# Patient Record
Sex: Male | Born: 1960 | Race: White | Hispanic: No | Smoking: Never smoker
Health system: Southern US, Community
[De-identification: ages and names within clinical notes are randomized; demographics above are authoritative.]

## PROBLEM LIST (undated history)

## (undated) DIAGNOSIS — I82409 Acute embolism and thrombosis of unspecified deep veins of unspecified lower extremity: Secondary | ICD-10-CM

## (undated) DIAGNOSIS — H269 Unspecified cataract: Secondary | ICD-10-CM

## (undated) DIAGNOSIS — I7121 Aneurysm of the ascending aorta, without rupture: Secondary | ICD-10-CM

## (undated) DIAGNOSIS — E785 Hyperlipidemia, unspecified: Secondary | ICD-10-CM

## (undated) DIAGNOSIS — Z5189 Encounter for other specified aftercare: Secondary | ICD-10-CM

## (undated) DIAGNOSIS — Z952 Presence of prosthetic heart valve: Secondary | ICD-10-CM

## (undated) DIAGNOSIS — Z86010 Personal history of colonic polyps: Secondary | ICD-10-CM

## (undated) DIAGNOSIS — I330 Acute and subacute infective endocarditis: Secondary | ICD-10-CM

## (undated) DIAGNOSIS — G43909 Migraine, unspecified, not intractable, without status migrainosus: Secondary | ICD-10-CM

## (undated) DIAGNOSIS — I639 Cerebral infarction, unspecified: Secondary | ICD-10-CM

## (undated) DIAGNOSIS — N200 Calculus of kidney: Secondary | ICD-10-CM

## (undated) DIAGNOSIS — I712 Thoracic aortic aneurysm, without rupture: Secondary | ICD-10-CM

## (undated) DIAGNOSIS — F419 Anxiety disorder, unspecified: Secondary | ICD-10-CM

## (undated) HISTORY — DX: Migraine, unspecified, not intractable, without status migrainosus: G43.909

## (undated) HISTORY — PX: COLONOSCOPY: SHX174

## (undated) HISTORY — DX: Personal history of colonic polyps: Z86.010

## (undated) HISTORY — DX: Calculus of kidney: N20.0

## (undated) HISTORY — PX: ABDOMINAL AORTIC ANEURYSM REPAIR: SUR1152

## (undated) HISTORY — DX: Thoracic aortic aneurysm, without rupture: I71.2

## (undated) HISTORY — DX: Hyperlipidemia, unspecified: E78.5

## (undated) HISTORY — DX: Unspecified cataract: H26.9

## (undated) HISTORY — DX: Cerebral infarction, unspecified: I63.9

## (undated) HISTORY — PX: POLYPECTOMY: SHX149

## (undated) HISTORY — DX: Anxiety disorder, unspecified: F41.9

## (undated) HISTORY — DX: Aneurysm of the ascending aorta, without rupture: I71.21

## (undated) HISTORY — DX: Encounter for other specified aftercare: Z51.89

---

## 1998-07-04 ENCOUNTER — Emergency Department (HOSPITAL_COMMUNITY): Admission: EM | Admit: 1998-07-04 | Discharge: 1998-07-04 | Payer: Self-pay | Admitting: Internal Medicine

## 2005-01-22 ENCOUNTER — Encounter: Admission: RE | Admit: 2005-01-22 | Discharge: 2005-01-22 | Payer: Self-pay | Admitting: Cardiology

## 2005-01-24 HISTORY — PX: CARDIAC CATHETERIZATION: SHX172

## 2005-03-26 HISTORY — PX: AORTIC VALVE REPLACEMENT: SHX41

## 2005-04-17 DIAGNOSIS — Q231 Congenital insufficiency of aortic valve: Secondary | ICD-10-CM

## 2005-04-17 DIAGNOSIS — I719 Aortic aneurysm of unspecified site, without rupture: Secondary | ICD-10-CM | POA: Insufficient documentation

## 2005-04-17 DIAGNOSIS — Z952 Presence of prosthetic heart valve: Secondary | ICD-10-CM | POA: Insufficient documentation

## 2005-05-17 ENCOUNTER — Encounter: Admission: RE | Admit: 2005-05-17 | Discharge: 2005-05-17 | Payer: Self-pay | Admitting: Cardiology

## 2006-01-01 ENCOUNTER — Ambulatory Visit: Payer: Self-pay | Admitting: Family Medicine

## 2006-01-17 ENCOUNTER — Ambulatory Visit: Payer: Self-pay | Admitting: Family Medicine

## 2007-03-07 ENCOUNTER — Ambulatory Visit: Payer: Self-pay | Admitting: Family Medicine

## 2007-04-15 ENCOUNTER — Ambulatory Visit: Payer: Self-pay | Admitting: Family Medicine

## 2008-03-26 HISTORY — PX: COLON SURGERY: SHX602

## 2008-04-07 ENCOUNTER — Ambulatory Visit: Payer: Self-pay | Admitting: Family Medicine

## 2008-04-09 ENCOUNTER — Encounter: Admission: RE | Admit: 2008-04-09 | Discharge: 2008-04-09 | Payer: Self-pay | Admitting: Family Medicine

## 2008-04-09 ENCOUNTER — Ambulatory Visit: Payer: Self-pay | Admitting: Family Medicine

## 2008-06-07 ENCOUNTER — Inpatient Hospital Stay (HOSPITAL_COMMUNITY): Admission: AD | Admit: 2008-06-07 | Discharge: 2008-06-10 | Payer: Self-pay | Admitting: Cardiovascular Disease

## 2008-06-07 DIAGNOSIS — Z8672 Personal history of thrombophlebitis: Secondary | ICD-10-CM

## 2008-06-08 ENCOUNTER — Encounter (INDEPENDENT_AMBULATORY_CARE_PROVIDER_SITE_OTHER): Payer: Self-pay | Admitting: Cardiovascular Disease

## 2008-06-11 ENCOUNTER — Ambulatory Visit: Payer: Self-pay | Admitting: Family Medicine

## 2008-06-13 ENCOUNTER — Inpatient Hospital Stay (HOSPITAL_COMMUNITY): Admission: EM | Admit: 2008-06-13 | Discharge: 2008-06-14 | Payer: Self-pay | Admitting: Emergency Medicine

## 2008-06-14 ENCOUNTER — Encounter (INDEPENDENT_AMBULATORY_CARE_PROVIDER_SITE_OTHER): Payer: Self-pay | Admitting: Cardiology

## 2008-06-16 ENCOUNTER — Ambulatory Visit: Payer: Self-pay | Admitting: Family Medicine

## 2008-06-16 ENCOUNTER — Inpatient Hospital Stay (HOSPITAL_COMMUNITY): Admission: AD | Admit: 2008-06-16 | Discharge: 2008-06-21 | Payer: Self-pay | Admitting: Cardiology

## 2008-06-16 ENCOUNTER — Ambulatory Visit: Payer: Self-pay | Admitting: Internal Medicine

## 2008-06-16 ENCOUNTER — Encounter (INDEPENDENT_AMBULATORY_CARE_PROVIDER_SITE_OTHER): Payer: Self-pay | Admitting: Cardiology

## 2008-06-17 ENCOUNTER — Ambulatory Visit: Payer: Self-pay | Admitting: Vascular Surgery

## 2008-06-17 ENCOUNTER — Encounter (INDEPENDENT_AMBULATORY_CARE_PROVIDER_SITE_OTHER): Payer: Self-pay | Admitting: Cardiology

## 2008-06-28 ENCOUNTER — Telehealth: Payer: Self-pay | Admitting: Internal Medicine

## 2008-06-29 DIAGNOSIS — I330 Acute and subacute infective endocarditis: Secondary | ICD-10-CM

## 2008-07-05 DIAGNOSIS — T827XXA Infection and inflammatory reaction due to other cardiac and vascular devices, implants and grafts, initial encounter: Secondary | ICD-10-CM | POA: Insufficient documentation

## 2008-07-07 ENCOUNTER — Encounter: Payer: Self-pay | Admitting: Internal Medicine

## 2008-07-10 ENCOUNTER — Emergency Department (HOSPITAL_COMMUNITY): Admission: EM | Admit: 2008-07-10 | Discharge: 2008-07-10 | Payer: Self-pay | Admitting: Emergency Medicine

## 2008-07-11 ENCOUNTER — Encounter: Payer: Self-pay | Admitting: Cardiology

## 2008-07-13 ENCOUNTER — Telehealth: Payer: Self-pay | Admitting: Internal Medicine

## 2008-07-16 DIAGNOSIS — I359 Nonrheumatic aortic valve disorder, unspecified: Secondary | ICD-10-CM | POA: Insufficient documentation

## 2008-07-27 ENCOUNTER — Telehealth: Payer: Self-pay | Admitting: Cardiology

## 2008-07-30 ENCOUNTER — Encounter: Payer: Self-pay | Admitting: Internal Medicine

## 2008-07-30 DIAGNOSIS — Z8673 Personal history of transient ischemic attack (TIA), and cerebral infarction without residual deficits: Secondary | ICD-10-CM | POA: Insufficient documentation

## 2008-07-30 DIAGNOSIS — Q249 Congenital malformation of heart, unspecified: Secondary | ICD-10-CM | POA: Insufficient documentation

## 2008-08-05 ENCOUNTER — Telehealth: Payer: Self-pay | Admitting: Cardiology

## 2008-08-06 ENCOUNTER — Encounter: Payer: Self-pay | Admitting: Cardiology

## 2008-08-09 ENCOUNTER — Ambulatory Visit: Payer: Self-pay | Admitting: Cardiology

## 2008-08-10 ENCOUNTER — Encounter: Payer: Self-pay | Admitting: Cardiology

## 2008-08-13 ENCOUNTER — Ambulatory Visit: Payer: Self-pay | Admitting: Cardiology

## 2008-08-13 ENCOUNTER — Ambulatory Visit: Payer: Self-pay | Admitting: Internal Medicine

## 2008-08-16 ENCOUNTER — Ambulatory Visit: Payer: Self-pay | Admitting: Cardiovascular Disease

## 2008-08-24 ENCOUNTER — Encounter: Payer: Self-pay | Admitting: *Deleted

## 2008-08-24 ENCOUNTER — Ambulatory Visit: Payer: Self-pay | Admitting: Cardiology

## 2008-08-24 LAB — CONVERTED CEMR LAB
POC INR: 4.4
Protime: 25.3

## 2008-08-31 ENCOUNTER — Ambulatory Visit: Payer: Self-pay | Admitting: Cardiology

## 2008-08-31 ENCOUNTER — Encounter (INDEPENDENT_AMBULATORY_CARE_PROVIDER_SITE_OTHER): Payer: Self-pay | Admitting: Cardiology

## 2008-08-31 LAB — CONVERTED CEMR LAB
POC INR: 1.8
Protime: 16.4

## 2008-09-09 ENCOUNTER — Ambulatory Visit: Payer: Self-pay | Admitting: Internal Medicine

## 2008-09-09 LAB — CONVERTED CEMR LAB
POC INR: 3.2
Protime: 21.6

## 2008-09-23 ENCOUNTER — Ambulatory Visit: Payer: Self-pay | Admitting: Cardiology

## 2008-09-23 LAB — CONVERTED CEMR LAB
POC INR: 2.8
Prothrombin Time: 20.4 s

## 2008-09-29 ENCOUNTER — Encounter: Payer: Self-pay | Admitting: *Deleted

## 2008-10-12 ENCOUNTER — Encounter: Admission: RE | Admit: 2008-10-12 | Discharge: 2008-10-12 | Payer: Self-pay | Admitting: Neurology

## 2008-10-13 ENCOUNTER — Telehealth: Payer: Self-pay | Admitting: Cardiology

## 2008-10-14 ENCOUNTER — Ambulatory Visit: Payer: Self-pay | Admitting: Cardiovascular Disease

## 2008-10-14 LAB — CONVERTED CEMR LAB
POC INR: 2.4
Prothrombin Time: 18.7 s

## 2008-10-22 ENCOUNTER — Telehealth: Payer: Self-pay | Admitting: Cardiology

## 2008-11-02 ENCOUNTER — Encounter: Payer: Self-pay | Admitting: Cardiology

## 2008-11-11 ENCOUNTER — Ambulatory Visit: Payer: Self-pay | Admitting: Cardiology

## 2008-11-11 LAB — CONVERTED CEMR LAB: POC INR: 1.6

## 2008-11-17 ENCOUNTER — Ambulatory Visit: Payer: Self-pay | Admitting: Cardiology

## 2008-11-17 DIAGNOSIS — I6340 Cerebral infarction due to embolism of unspecified cerebral artery: Secondary | ICD-10-CM | POA: Insufficient documentation

## 2008-11-26 ENCOUNTER — Ambulatory Visit: Payer: Self-pay | Admitting: Internal Medicine

## 2008-11-26 LAB — CONVERTED CEMR LAB: POC INR: 2.2

## 2008-12-07 ENCOUNTER — Encounter: Payer: Self-pay | Admitting: Cardiology

## 2008-12-07 ENCOUNTER — Ambulatory Visit: Payer: Self-pay | Admitting: Cardiology

## 2008-12-07 ENCOUNTER — Ambulatory Visit (HOSPITAL_COMMUNITY): Admission: RE | Admit: 2008-12-07 | Discharge: 2008-12-07 | Payer: Self-pay | Admitting: Cardiology

## 2008-12-10 ENCOUNTER — Ambulatory Visit: Payer: Self-pay | Admitting: Internal Medicine

## 2008-12-10 LAB — CONVERTED CEMR LAB: POC INR: 2.7

## 2008-12-31 ENCOUNTER — Ambulatory Visit: Payer: Self-pay | Admitting: Internal Medicine

## 2008-12-31 LAB — CONVERTED CEMR LAB: POC INR: 3.1

## 2009-01-20 ENCOUNTER — Encounter (INDEPENDENT_AMBULATORY_CARE_PROVIDER_SITE_OTHER): Payer: Self-pay | Admitting: *Deleted

## 2009-01-25 ENCOUNTER — Ambulatory Visit: Payer: Self-pay | Admitting: Cardiology

## 2009-03-07 ENCOUNTER — Ambulatory Visit: Payer: Self-pay | Admitting: Cardiovascular Disease

## 2009-03-07 LAB — CONVERTED CEMR LAB: POC INR: 2.3

## 2009-04-04 ENCOUNTER — Ambulatory Visit: Payer: Self-pay | Admitting: Internal Medicine

## 2009-04-04 LAB — CONVERTED CEMR LAB: POC INR: 2.2

## 2009-04-25 ENCOUNTER — Encounter (INDEPENDENT_AMBULATORY_CARE_PROVIDER_SITE_OTHER): Payer: Self-pay | Admitting: Cardiology

## 2009-04-25 ENCOUNTER — Ambulatory Visit: Payer: Self-pay | Admitting: Cardiology

## 2009-04-25 LAB — CONVERTED CEMR LAB: POC INR: 2.4

## 2009-05-16 ENCOUNTER — Telehealth: Payer: Self-pay | Admitting: Cardiology

## 2009-05-23 ENCOUNTER — Ambulatory Visit: Payer: Self-pay | Admitting: Cardiology

## 2009-05-23 LAB — CONVERTED CEMR LAB: POC INR: 2.7

## 2009-07-19 ENCOUNTER — Ambulatory Visit: Payer: Self-pay | Admitting: Cardiovascular Disease

## 2009-07-19 LAB — CONVERTED CEMR LAB: POC INR: 2.4

## 2009-08-08 ENCOUNTER — Telehealth: Payer: Self-pay | Admitting: Cardiology

## 2009-08-11 ENCOUNTER — Ambulatory Visit: Payer: Self-pay | Admitting: Cardiology

## 2009-08-11 DIAGNOSIS — R5381 Other malaise: Secondary | ICD-10-CM | POA: Insufficient documentation

## 2009-08-11 DIAGNOSIS — R5383 Other fatigue: Secondary | ICD-10-CM

## 2009-08-24 ENCOUNTER — Ambulatory Visit: Payer: Self-pay | Admitting: Cardiology

## 2009-08-24 LAB — CONVERTED CEMR LAB: POC INR: 2.6

## 2009-10-04 ENCOUNTER — Ambulatory Visit: Payer: Self-pay | Admitting: Cardiovascular Disease

## 2009-10-04 LAB — CONVERTED CEMR LAB: POC INR: 2.3

## 2009-11-01 ENCOUNTER — Ambulatory Visit: Payer: Self-pay | Admitting: Cardiology

## 2009-11-01 LAB — CONVERTED CEMR LAB: POC INR: 2.9

## 2009-11-29 ENCOUNTER — Ambulatory Visit: Payer: Self-pay | Admitting: Cardiovascular Disease

## 2009-11-29 LAB — CONVERTED CEMR LAB: POC INR: 3.6

## 2009-12-13 ENCOUNTER — Ambulatory Visit: Payer: Self-pay | Admitting: Cardiology

## 2009-12-13 LAB — CONVERTED CEMR LAB: POC INR: 3.3

## 2010-01-10 ENCOUNTER — Ambulatory Visit: Payer: Self-pay | Admitting: Cardiology

## 2010-01-10 LAB — CONVERTED CEMR LAB: POC INR: 2.7

## 2010-02-07 ENCOUNTER — Ambulatory Visit: Payer: Self-pay | Admitting: Cardiovascular Disease

## 2010-02-07 LAB — CONVERTED CEMR LAB: POC INR: 3

## 2010-03-07 ENCOUNTER — Ambulatory Visit: Payer: Self-pay | Admitting: Internal Medicine

## 2010-03-28 ENCOUNTER — Ambulatory Visit
Admission: RE | Admit: 2010-03-28 | Discharge: 2010-03-28 | Payer: Self-pay | Source: Home / Self Care | Attending: Family Medicine | Admitting: Family Medicine

## 2010-03-28 ENCOUNTER — Encounter (INDEPENDENT_AMBULATORY_CARE_PROVIDER_SITE_OTHER): Payer: Self-pay | Admitting: *Deleted

## 2010-03-29 ENCOUNTER — Telehealth: Payer: Self-pay | Admitting: Gastroenterology

## 2010-04-04 ENCOUNTER — Ambulatory Visit: Admission: RE | Admit: 2010-04-04 | Discharge: 2010-04-04 | Payer: Self-pay | Source: Home / Self Care

## 2010-04-04 LAB — CONVERTED CEMR LAB: POC INR: 2.3

## 2010-04-11 ENCOUNTER — Ambulatory Visit
Admission: RE | Admit: 2010-04-11 | Discharge: 2010-04-11 | Payer: Self-pay | Source: Home / Self Care | Attending: Gastroenterology | Admitting: Gastroenterology

## 2010-04-11 DIAGNOSIS — R1310 Dysphagia, unspecified: Secondary | ICD-10-CM | POA: Insufficient documentation

## 2010-04-13 ENCOUNTER — Ambulatory Visit (HOSPITAL_COMMUNITY)
Admission: RE | Admit: 2010-04-13 | Discharge: 2010-04-13 | Payer: Self-pay | Source: Home / Self Care | Attending: Gastroenterology | Admitting: Gastroenterology

## 2010-04-14 ENCOUNTER — Encounter: Payer: Self-pay | Admitting: Gastroenterology

## 2010-04-23 LAB — CONVERTED CEMR LAB
BUN: 16 mg/dL (ref 6–23)
Basophils Absolute: 0 10*3/uL (ref 0.0–0.1)
Basophils Relative: 0.4 % (ref 0.0–3.0)
CO2: 30 meq/L (ref 19–32)
Calcium: 9.2 mg/dL (ref 8.4–10.5)
Chloride: 107 meq/L (ref 96–112)
Creatinine, Ser: 0.9 mg/dL (ref 0.4–1.5)
Eosinophils Absolute: 0.1 10*3/uL (ref 0.0–0.7)
Eosinophils Relative: 2.4 % (ref 0.0–5.0)
GFR calc non Af Amer: 96.1 mL/min (ref >60)
Glucose, Bld: 76 mg/dL (ref 70–99)
HCT: 39.4 % (ref 39.0–52.0)
Hemoglobin: 13.2 g/dL (ref 13.0–17.0)
Lymphocytes Relative: 31.3 % (ref 12.0–46.0)
Lymphs Abs: 1.7 10*3/uL (ref 0.7–4.0)
MCHC: 33.4 g/dL (ref 30.0–36.0)
MCV: 87.5 fL (ref 78.0–100.0)
Monocytes Absolute: 0.5 10*3/uL (ref 0.1–1.0)
Monocytes Relative: 9.2 % (ref 3.0–12.0)
Neutro Abs: 3 10*3/uL (ref 1.4–7.7)
Neutrophils Relative %: 56.7 % (ref 43.0–77.0)
Platelets: 437 10*3/uL — ABNORMAL HIGH (ref 150.0–400.0)
Potassium: 3.8 meq/L (ref 3.5–5.1)
RBC: 4.5 M/uL (ref 4.22–5.81)
RDW: 14.5 % (ref 11.5–14.6)
Sodium: 141 meq/L (ref 135–145)
WBC: 5.3 10*3/uL (ref 4.5–10.5)

## 2010-04-25 NOTE — Medication Information (Signed)
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 no    Recent/future dental: no  Any missed doses?: no       Is patient compliant with meds? yes       Allergies: No Known Drug Allergies  Anticoagulation Management History:      The patient is taking warfarin and comes in today for a routine follow up visit.  Positive risk factors for bleeding include history of CVA/TIA.  Negative risk factors for bleeding include an age less than 58 years old.  The bleeding index is 'intermediate risk'.  Positive CHADS2 values include Prior Stroke/CVA/TIA.  Negative CHADS2 values include Age > 42 years old.  The start date was 08/09/2008.  Anticoagulation responsible provider: Clifton Jeffery MD, Cristal Deer.  INR POC: 2.3.  Cuvette Lot#: [id number].  Exp: 11/2010.    Anticoagulation Management Assessment/Plan:      The patient's current anticoagulation dose is Coumadin 5 mg tabs: Take as directed by coumadin clinic..  The target INR is 2.5-3.5.  The next INR is due 10/31/2009.  Anticoagulation instructions were given to patient.  Results were reviewed/authorized by Weston Brass, PharmD.  He was notified by Weston Brass PharmD.         Prior Anticoagulation Instructions: INR 2.6  Continue same dose of 1 tablet every day except 1 1/2 tablets on Tuesday, Thursday and Saturday  Current Anticoagulation Instructions: INR 2.3  Increase dose to 1 1/2 tablets every day except 1 tablet on Monday, Wednesday and Friday.

## 2010-04-25 NOTE — Progress Notes (Signed)
Summary: fatigue after exercise  Phone Note Call from Patient Call back at Home Phone [phone] Call back at [phone]   [facility] to Nurse Summary of Call: C/O fatigue after exercise . h/o 2 open heart surgery.  Initial call taken by: Lorne Skeens,  Aug 08, 2009 8:43 AM  Follow-up for Phone Call        SPOKE WITH WIFE C/O FATIGUE WITH VERY LITTLE ACTIVITY  HR  INCREASES WITH MIN ACTIVITY TO AS HIGH AS 173. NO CHEST PAIN  OR EDEMA B/P UNKNOWN DUE FOR 6 M O F/U IN APRIL APPT SCHEDULED  FOR 5 /19/11 AT 2:45. Follow-up by: Scherrie Bateman, LPN,  Aug 08, 2009 10:16 AM  Additional Follow-up for Phone Call Additional follow up Details #1::        agree Additional Follow-up by: Gaylord Shih, MD, [facility],  Aug 08, 2009 11:21 AM

## 2010-04-25 NOTE — Assessment & Plan Note (Signed)
Summary: f35m   Visit Type:  Follow-up Primary Provider:  Ronnald Nian  CC:  no cardiac complaints.  History of Present Illness: Mr. Shannahan comes in today for followup of his complex aortic valve disease and history of stroke.  He's doing remarkably well. He's lost about 12 pounds. He is exercising on a regular basis. He denies any palpitations, presyncope or syncope, fever chills, night sweats. His biggest concern is that he is having of erectile dysfunction.  He is only taking Coumadin and gabapentin now. The gabapentin as for plan pains from his stroke on the left side.  Current Medications (verified): 1)  Coumadin 5 Mg Tabs (Warfarin Sodium) .... Take As Directed By Coumadin Clinic. 2)  Gabapentin 400 Mg Caps (Gabapentin) .Marland Kitchen.. 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 Replacement of ascending aorta with 26 mm Vascutek Gelweave vascular prosthesis and aortic root replacement with 27 mm Medtronic freestyle aortic root heart valve 04/17/05. 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 Jude Regent aortic prosthesis 07/16/08.  Family History: Last updated: 07/30/2008 noncontributory  Social History: Last updated: 07/30/2008 Married  Alcohol Use - yes..social Full Time Tobacco Use - No.   Risk Factors: Smoking Status: never (07/30/2008)  Review of Systems       negative other than history of present illness  Vital Signs:  Patient profile:   50 year old male Height:      71 inches Weight:      188.8 pounds BMI:     26.43 Pulse rate:   70 / minute BP sitting:   118 / 76  (left arm) Cuff size:   large  Vitals Entered By: Scherrie Bateman, LPN (December 13, 2009 9:49 AM)  Physical Exam  General:  Well developed, well nourished, in no acute distress. Head:  normocephalic and atraumatic Eyes:  PERRLA/EOM intact; conjunctiva and lids normal. Neck:  Neck supple, no JVD. No masses, thyromegaly or abnormal cervical nodes. Chest Wall:  no deformities or breast masses noted Lungs:  Clear bilaterally to auscultation and percussion. Heart:  PMI nondisplaced, regular rate and rhythm, normal S1 split S2, no diastolic murmur, soft systolic murmur. Carotid upstrokes equal bilaterally without bruit Msk:  Back normal, normal gait. Muscle strength and tone normal. Pulses:  pulses normal in all 4 extremities Extremities:  No clubbing or cyanosis. Neurologic:  Alert and oriented x 3. Skin:  Intact without lesions or rashes. Psych:  Normal affect.   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 His updated medication list for this problem includes:    Coumadin 5 Mg Tabs (Warfarin sodium) .Marland Kitchen... Take as directed by coumadin clinic.  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 Take as directed by coumadin clinic.  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 Take as directed by coumadin clinic.  Patient Instructions: 1)  Your physician recommends that you schedule a follow-up appointment in: YEAR WITH DR WALL 2)  Your physician recommends that you continue on your current medications as directed. Please refer to the Current Medication list given to you today.

## 2010-04-25 NOTE — Medication Information (Signed)
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    Health status changes: no    Bleeding/hemorrhagic complications: no    Recent/future hospitalizations: no    Any changes in medication regimen? no    Recent/future dental: no  Any missed doses?: no       Is patient compliant with meds? yes       Allergies (verified): No Known Drug Allergies  Anticoagulation Management History:      The patient is taking warfarin and comes in today for a routine follow up visit.  Positive risk factors for bleeding include history of CVA/TIA.  Negative risk factors for bleeding include an age less than 60 years old.  The bleeding index is 'intermediate risk'.  Positive CHADS2 values include Prior Stroke/CVA/TIA.  Negative CHADS2 values include Age > 14 years old.  The start date was 08/09/2008.  Anticoagulation responsible provider: Tenny Craw MD, Gunnar Fusi.  INR POC: 2.2.  Cuvette Lot#: [id number].  Exp: 04/2010.    Anticoagulation Management Assessment/Plan:      The patient's current anticoagulation dose is Coumadin 5 mg tabs: Take as directed by coumadin clinic..  The target INR is 2.5-3.5.  The next INR is due 04/25/2009.  Anticoagulation instructions were given to patient.  Results were reviewed/authorized by Cloyde Reams, RN, BSN.  He was notified by Cloyde Reams RN.         Prior Anticoagulation Instructions: INR 2.3 Today take 7.5mg s then resume 5mg s daily except 7.5mg s on Sundays. Recheck in 4 weeks.   Current Anticoagulation Instructions: INR 2.2  Incr dosage to 5mg  daily except 7.5mg  on Sundays and Wednesdays.   Recheck in 3 weeks.

## 2010-04-25 NOTE — Medication Information (Signed)
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   Health status changes: no    Bleeding/hemorrhagic complications: no    Recent/future hospitalizations: no    Any changes in medication regimen? no    Recent/future dental: no  Any missed doses?: no       Is patient compliant with meds? yes       Allergies: No Known Drug Allergies  Anticoagulation Management History:      The patient is taking warfarin and comes in today for a routine follow up visit.  Positive risk factors for bleeding include history of CVA/TIA.  Negative risk factors for bleeding include an age less than 55 years old.  The bleeding index is 'intermediate risk'.  Positive CHADS2 values include Prior Stroke/CVA/TIA.  Negative CHADS2 values include Age > 36 years old.  The start date was 08/09/2008.  Anticoagulation responsible provider: Daleen Squibb MD, Maisie Fus.  INR POC: 2.9.  Cuvette Lot#: [id number].  Exp: 12/2010.    Anticoagulation Management Assessment/Plan:      The patient's current anticoagulation dose is Coumadin 5 mg tabs: Take as directed by coumadin clinic..  The target INR is 2.5-3.5.  The next INR is due 11/29/2009.  Anticoagulation instructions were given to patient.  Results were reviewed/authorized by Weston Brass, PharmD.  He was notified by Gweneth Fritter, PharmD [employer].         Prior Anticoagulation Instructions: INR 2.3  Increase dose to 1 1/2 tablets every day except 1 tablet on Monday, Wednesday and Friday.   Current Anticoagulation Instructions: INR 2.9  Continue taking 1.5 tablets (7.5mg ) every day except take 1 tablet (5mg ) on Mon, Wed, and Fri.  Recheck in 4 weeks.

## 2010-04-25 NOTE — Medication Information (Signed)
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 yes       Details: increased gabapentin to 800mg  daily  Recent/future dental: no  Any missed doses?: yes     Details: Missed last Thursday- took extra 1/2 tablet on Friday and Saturday to make up for it   Is patient compliant with meds? yes       Allergies: No Known Drug Allergies  Anticoagulation Management History:      The patient is taking warfarin and comes in today for a routine follow up visit.  Positive risk factors for bleeding include history of CVA/TIA.  Negative risk factors for bleeding include an age less than 33 years old.  The bleeding index is 'intermediate risk'.  Positive CHADS2 values include Prior Stroke/CVA/TIA.  Negative CHADS2 values include Age > 44 years old.  The start date was 08/09/2008.  Anticoagulation responsible provider: Riley Kill MD, Maisie Fus.  INR POC: 2.6.  Cuvette Lot#: [id number].  Exp: 10/2010.    Anticoagulation Management Assessment/Plan:      The patient's current anticoagulation dose is Coumadin 5 mg tabs: Take as directed by coumadin clinic..  The target INR is 2.5-3.5.  The next INR is due 09/21/2009.  Anticoagulation instructions were given to patient.  Results were reviewed/authorized by Weston Brass, PharmD.  He was notified by Weston Brass PharmD.         Prior Anticoagulation Instructions: INR 2.4 Today take 10mg s then resume 5mg s daily except 7.5mg s on Tuesdays, Thursdays and Saturdays.   Current Anticoagulation Instructions: INR 2.6  Continue same dose of 1 tablet every  day except 1 1/2 tablets on Tuesday, Thursday and Saturday

## 2010-04-25 NOTE — Medication Information (Signed)
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 no    Recent/future dental: no  Any missed doses?: no       Is patient compliant with meds? yes       Allergies: No Known Drug Allergies  Anticoagulation Management History:      The patient is taking warfarin and comes in today for a routine follow up visit.  Positive risk factors for bleeding include history of CVA/TIA.  Negative risk factors for bleeding include an age less than 29 years old.  The bleeding index is 'intermediate risk'.  Positive CHADS2 values include Prior Stroke/CVA/TIA.  Negative CHADS2 values include Age > 46 years old.  The start date was 08/09/2008.  Anticoagulation responsible provider: Eden Emms MD, Theron Arista.  INR POC: 2.4.  Cuvette Lot#: [id number].  Exp: 08/2010.    Anticoagulation Management Assessment/Plan:      The patient's current anticoagulation dose is Coumadin 5 mg tabs: Take as directed by coumadin clinic..  The target INR is 2.5-3.5.  The next INR is due 08/16/2009.  Anticoagulation instructions were given to patient.  Results were reviewed/authorized by Bethena Midget, RN, BSN.  He was notified by Bethena Midget, RN, BSN.         Prior Anticoagulation Instructions: INR 2.7  Continue on same dosage 1 tablet daily except 1.5 tablets oTuesdays, Thursdays, and Saturdays.  Recheck in 4 weeks.    Current Anticoagulation Instructions: INR 2.4 Today take 10mg s then resume 5mg s daily except 7.5mg s on Tuesdays, Thursdays and Saturdays.

## 2010-04-25 NOTE — Letter (Signed)
Summary: Handout Printed  Printed Handout:  - Coumadin Instructions-w/out Meds 

## 2010-04-25 NOTE — Medication Information (Signed)
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 no    Recent/future dental: no  Any missed doses?: no       Is patient compliant with meds? yes       Allergies: No Known Drug Allergies  Anticoagulation Management History:      The patient is taking warfarin and comes in today for a routine follow up visit.  Positive risk factors for bleeding include history of CVA/TIA.  Negative risk factors for bleeding include an age less than 79 years old.  The bleeding index is 'intermediate risk'.  Positive CHADS2 values include Prior Stroke/CVA/TIA.  Negative CHADS2 values include Age > 41 years old.  The start date was 08/09/2008.  Anticoagulation responsible provider: Myrtis Ser MD, Tinnie Gens.  INR POC: 2.7.  Cuvette Lot#: [id number].  Exp: 01/2011.    Anticoagulation Management Assessment/Plan:      The patient's current anticoagulation dose is Coumadin 5 mg tabs: Take as directed by coumadin clinic..  The target INR is 2.5-3.5.  The next INR is due 02/07/2010.  Anticoagulation instructions were given to patient.  Results were reviewed/authorized by Cloyde Reams, RN, BSN.  He was notified by Cloyde Reams RN.         Prior Anticoagulation Instructions: INR 3.3 Continue 7.5mg s daily except 5mg s on Mondays, Wednesdays and Fridays. Recheck in 4 weeks.   Current Anticoagulation Instructions: INR 2.7  Continue on same dosage 7.5mg  daily except 5mg  on Mondays, Wednesdays, and Fridays.  Recheck in 4 weeks.

## 2010-04-25 NOTE — Medication Information (Signed)
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 no    Recent/future dental: no  Any missed doses?: no       Is patient compliant with meds? yes       Current Medications (verified): 1)  Aspirin 81 Mg Tbec (Aspirin) .... Pt Has Not Been Taking 2)  Percocet 5-325 Mg Tabs (Oxycodone-Acetaminophen) .... As Needed 3)  Coumadin 5 Mg Tabs (Warfarin Sodium) .... Take As Directed By Coumadin Clinic. 4)  Lyrica 50 Mg Caps (Pregabalin) .... 3 Caps Three Times A Day  Allergies (verified): No Known Drug Allergies  Anticoagulation Management History:      The patient is taking warfarin and comes in today for a routine follow up visit.  Positive risk factors for bleeding include history of CVA/TIA.  Negative risk factors for bleeding include an age less than 63 years old.  The bleeding index is 'intermediate risk'.  Positive CHADS2 values include Prior Stroke/CVA/TIA.  Negative CHADS2 values include Age > 94 years old.  The start date was 08/09/2008.  Anticoagulation responsible provider: Shirlee Latch MD, Jayvan Mcshan.  INR POC: 2.4.  Cuvette Lot#: [id number].  Exp: 06/2010.    Anticoagulation Management Assessment/Plan:      The patient's current anticoagulation dose is Coumadin 5 mg tabs: Take as directed by coumadin clinic..  The target INR is 2.5-3.5.  The next INR is due 05/23/2009.  Anticoagulation instructions were given to patient.  Results were reviewed/authorized by Shelby Dubin, PharmD, BCPS, CPP.  He was notified by Shelby Dubin PharmD, BCPS, CPP.         Prior  Anticoagulation Instructions: INR 2.2  Incr dosage to 5mg  daily except 7.5mg  on Sundays and Wednesdays.   Recheck in 3 weeks.    Current Anticoagulation Instructions: INR 2.4  Take 1.5 tabs today, then increase to 1.5 tabs each Sunday, Tuesday, Thursday and 1 tab on all other days.  Recheck in 4 weeks.

## 2010-04-25 NOTE — Medication Information (Signed)
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 no    Recent/future dental: no  Any missed doses?: no       Is patient compliant with meds? yes       Allergies: No Known Drug Allergies  Anticoagulation Management History:      The patient is taking warfarin and comes in today for a routine follow up visit.  Positive risk factors for bleeding include history of CVA/TIA.  Negative risk factors for bleeding include an age less than 39 years old.  The bleeding index is 'intermediate risk'.  Positive CHADS2 values include Prior Stroke/CVA/TIA.  Negative CHADS2 values include Age > 81 years old.  The start date was 08/09/2008.  Anticoagulation responsible provider: Daleen Squibb MD, Maisie Fus.  INR POC: 3.3.  Cuvette Lot#: [id number].  Exp: 01/2011.    Anticoagulation Management Assessment/Plan:      The patient's current anticoagulation dose is Coumadin 5 mg tabs: Take as directed by coumadin clinic..  The target INR is 2.5-3.5.  The next INR is due 01/10/2010.  Anticoagulation instructions were given to patient.  Results were reviewed/authorized by Bethena Midget, RN, BSN.  He was notified by Bethena Midget, RN, BSN.         Prior Anticoagulation Instructions: INR 3.6  Coumadin 1 tab today Tue 9/6 then 1 tab = 5mg  on Mon, Wed, Fri 1.5 tab = 7.5mg  Sun, Tue, Thur, Sat  Current Anticoagulation Instructions: INR 3.3 Continue 7.5mg s daily except 5mg s on Mondays, Wednesdays and Fridays. Recheck in 4 weeks.

## 2010-04-25 NOTE — Medication Information (Signed)
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 no    Recent/future dental: no  Any missed doses?: no       Is patient compliant with meds? yes       Current Medications (verified): 1)  Coumadin 5 Mg Tabs (Warfarin Sodium) .... Take As Directed By Coumadin Clinic. 2)  Gabapentin 300 Mg Caps (Gabapentin) .Marland Kitchen.. 1 Two Times A Day  Allergies (verified): No Known Drug Allergies  Anticoagulation Management History:      The patient is taking warfarin and comes in today for a routine follow up visit.  Positive risk factors for bleeding include history of CVA/TIA.  Negative risk factors for bleeding include an age less than 106 years old.  The bleeding index is 'intermediate risk'.  Positive CHADS2 values include Prior Stroke/CVA/TIA.  Negative CHADS2 values include Age > 3 years old.  The start date was 08/09/2008.  Anticoagulation responsible provider: Eden Emms MD, Theron Arista.  INR POC: 3.6.  Cuvette Lot#: [id number].  Exp: 12/2010.    Anticoagulation Management Assessment/Plan:      The patient's current anticoagulation dose is Coumadin 5 mg tabs: Take as directed by coumadin clinic..  The target INR is 2.5-3.5.  The next INR is due 12/27/2009.  Anticoagulation instructions were given to patient.  Results were reviewed/authorized by Leota Sauers, PharmD, BCPS, CPP.         Prior Anticoagulation Instructions: INR 2.9  Continue taking 1.5 tablets (7.5mg ) every day except take 1 tablet (5mg ) on Mon, Wed, and Fri.  Recheck in 4 weeks.   Current Anticoagulation  Instructions: INR 3.6  Coumadin 1 tab today Tue 9/6 then 1 tab = 5mg  on Mon, Wed, Fri 1.5 tab = 7.5mg  Sun, Tue, Thur, Sat

## 2010-04-25 NOTE — Medication Information (Signed)
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 yes       Details: Weaning off of Lyrica.  Recent/future dental: no  Any missed doses?: yes     Details: Missed 1 dosage 2 weeks ago, made it up.    Is patient compliant with meds? yes       Allergies (verified): No Known Drug Allergies  Anticoagulation Management History:      The patient is taking warfarin and comes in today for a routine follow up visit.  Positive risk factors for bleeding include history of CVA/TIA.  Negative risk factors for bleeding include an age less than 67 years old.  The bleeding index is 'intermediate risk'.  Positive CHADS2 values include Prior Stroke/CVA/TIA.  Negative CHADS2 values include Age > 102 years old.  The start date was 08/09/2008.  Anticoagulation responsible provider: Shirlee Latch MD, Makensey Rego.  INR POC: 2.7.  Cuvette Lot#: [id number].  Exp: 07/2010.    Anticoagulation Management Assessment/Plan:      The patient's current anticoagulation dose is Coumadin 5 mg tabs: Take as directed by coumadin clinic..  The target INR is 2.5-3.5.  The next INR is due 06/20/2009.  Anticoagulation instructions were given to patient.  Results were reviewed/authorized by Cloyde Reams, RN, BSN.  He was notified by Cloyde Reams RN.         Prior Anticoagulation Instructions: INR 2.4  Take 1.5 tabs today, then increase to 1.5 tabs each Sunday, Tuesday, Thursday and 1 tab on all other days.  Recheck in 4 weeks.    Current Anticoagulation Instructions: INR 2.7  Continue on same dosage 1 tablet daily  except 1.5 tablets oTuesdays, Thursdays, and Saturdays.  Recheck in 4 weeks.

## 2010-04-25 NOTE — Assessment & Plan Note (Signed)
Summary: c/o fatigue per wife ./cy   Visit Type:  rov Primary Provider:  Ronnald Nian  CC:  fatigue....no other complaints today.  History of Present Illness: Robert [name] comes in today because of decreased exercise tolerance. He is also complaining of fatigue after he exercises.  He's always been a runner. Unfortunately, over the past year he has gained 30 pounds. During this time he was taking Lyrica for her neuropathy. He was sleeping poorly, more fatigue and he is now, and sometimes was difficult to awake. He is to switch to gabapentin.  Since his switch he sleeps better, is more alert during the day, and is tolerating the drug better. He's lost 6 pounds.  He is always exercise heart in the past. Since his heart surgeries, he has always had a rapid increase in heart rate with exercise. He takes very little time for his heart rate to bump up to 170. I've asked him to keep it somewhere around 140-150.  The present time when he gets to 170, he stops and walks. Once again is back down to about 110 and 120 he starts running again. This is not profoundly different except he is very very tired afterwards.  He denies any angina or ischemic symptoms. He said orthopnea, PND or edema. He denies any fever chills or other symptoms of endocarditis. He's had no headaches.  We performed a transesophageal echo last fall which showed normal left ventricular function EF 55-60%, no Rayli Wiederhold motion him out these, bileaflet aortic valve prosthesis with mild mitral regurgitation but no aortic valve regurgitation, mild left atrial enlargement.  Current Medications (verified): 1)  Coumadin 5 Mg Tabs (Warfarin Sodium) .... Take As Directed By Coumadin Clinic. 2)  Gabapentin 300 Mg Caps (Gabapentin) .Marland Kitchen.. 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 Replacement of ascending aorta with 26 mm Vascutek Gelweave vascular prosthesis and aortic root replacement with 27 mm Medtronic freestyle aortic root heart valve 04/17/05. 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 Jude Regent aortic prosthesis 07/16/08.  Family History: Last updated: 07/30/2008 noncontributory  Social History: Last updated: 07/30/2008 Married  Alcohol Use - yes..social Full Time Tobacco Use - No.   Risk Factors: Smoking Status: never (07/30/2008)  Review of Systems       negative other than history of present illness  Vital Signs:  Patient profile:   50 year old male Height:      71 inches Weight:      196 pounds BMI:     27.44 Pulse rate:   66 / minute Pulse rhythm:   regular BP sitting:   112 / 70  (left arm) Cuff size:   large  Vitals Entered By: [name], CMA (Aug 11, 2009  3:11 PM)  Physical Exam  General:  Well developed, well nourished, in no acute distress. Head:  normocephalic and atraumatic Eyes:  PERRLA/EOM intact; conjunctiva and lids normal. Neck:  Neck supple, no JVD. No masses, thyromegaly or abnormal cervical nodes. Lungs:  Clear  bilaterally to auscultation and percussion. Heart:  PMI nondisplaced, no rub, normal S1 and metallic S2, no diastolic murmur, soft systolic murmur at the apex. No gallop. No right ventricular lift. Abdomen:  Bowel sounds positive; abdomen soft and non-tender without masses, organomegaly, or hernias noted. No hepatosplenomegaly. Msk:  Back normal, normal gait. Muscle strength and tone normal. Pulses:  pulses normal in all 4 extremities Extremities:  No clubbing or cyanosis. no peripheral signs of endocarditis. Neurologic:  Alert and oriented x 3. Skin:  Intact without lesions or rashes. Psych:  anxious.  hyperactive, excited   Problems:  Medical Problems Added: 1)  Dx of Fatigue / Malaise  (ICD-780.79)  EKG  Procedure date:  08/11/2009  Findings:      normal sinus rhythm, normal EKG  Impression & Recommendations:  Problem # 1:  FATIGUE / MALAISE (ICD-780.79) Assessment New I do not think this is cardiac related. I suspect that this is due to a 30 pound weight gain on Lyrica. I've recommended he go into a intense exercise program for fat burning as opposed to aerobic conditioning. Hopefully he can drop the 25-30 pounds over the next 6 months. I'll see him back in 4 months for interim followup. The longer he is off Lyrica, the better he'll feel as well.  Problem # 2:  AORTIC VALVE REPLACEMENT, HX OF (ICD-V43.3) Assessment: Unchanged  Problem # 3:  COUMADIN THERAPY (ICD-V58.61) Assessment: Unchanged  Problem # 4:  CVA (ICD-434.91) Assessment: Improved  The following medications were removed from the medication list:    Aspirin 81 Mg Tbec (Aspirin) .Marland Kitchen... Pt has not been taking His updated medication list for this problem includes:    Coumadin 5 Mg Tabs (Warfarin sodium) .Marland Kitchen... Take as directed by coumadin clinic.  Other Orders: EKG w/ Interpretation (93000)  Patient Instructions: 1)  Your physician recommends that you schedule a follow-up appointment in:4 MONTHS WITH DR  Mikail Goostree 2)  Your physician recommends that you continue on your current medications as directed. Please refer to the Current Medication list given to you today. 3)  Your physician discussed the importance of regular exercise and recommended that you start or continue a regular exercise program for good health.MAX HEART RATE 150 4)  Your physician encouraged you to lose weight for better health.

## 2010-04-25 NOTE — Progress Notes (Signed)
Summary: REFILL  Phone Note Refill Request Message from:  Patient on May 16, 2009 11:24 AM  Refills Requested: Medication #1:  COUMADIN 5 MG TABS Take as directed by coumadin clinic. SEND TO Lucita Ferrara  RD [phone]  Initial call taken by: Judie Grieve,  May 16, 2009 11:25 AM  Follow-up for Phone Call        Rx sent electronically to the pharmacy. Called spoke with pt's wife aware rx sent to pharmacy. Follow-up by: Cloyde Reams RN,  May 16, 2009 1:47 PM    Prescriptions: COUMADIN 5 MG TABS (WARFARIN SODIUM) Take as directed by coumadin clinic.  #45 x 2   Entered by:   Cloyde Reams RN   Authorized by:   Gaylord Shih, MD, [facility]   Signed by:   Cloyde Reams RN on 05/16/2009   Method used:   Electronically to        [facility] [phone]* (retail)       [address]       [place], [place]  [address]       Ph: [phone]       Fax: [phone]   RxID:   [id number]

## 2010-04-25 NOTE — Medication Information (Signed)
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 no    Recent/future dental: no  Any missed doses?: no       Is patient compliant with meds? yes       Current Medications (verified): 1)  Coumadin 5 Mg Tabs (Warfarin Sodium) .... Take As Directed By Coumadin Clinic. 2)  Neurontin 800 Mg Tabs (Gabapentin) .... Take 1 Tablet Bid  Allergies: No Known Drug Allergies  Anticoagulation Management History:      The patient is taking warfarin and comes in today for a routine follow up visit.  Positive risk factors for bleeding include history of CVA/TIA.  Negative risk factors for bleeding include an age less than 47 years old.  The bleeding index is 'intermediate risk'.  Positive CHADS2 values include Prior Stroke/CVA/TIA.  Negative CHADS2 values include Age > 47 years old.  The start date was 08/09/2008.  Anticoagulation responsible provider: Excell Seltzer MD, Casimiro Needle.  INR POC: 3.0.  Cuvette Lot#: [id number].  Exp: 02/2011.    Anticoagulation Management Assessment/Plan:      The patient's current anticoagulation dose is Coumadin 5 mg tabs: Take as directed by coumadin clinic..  The target INR is 2.5-3.5.  The next INR is due 03/07/2010.  Anticoagulation instructions were given to patient.  Results were reviewed/authorized by Bethena Midget, RN, BSN.  He was notified by Bethena Midget, RN, BSN.         Prior Anticoagulation Instructions: INR 2.7  Continue on same dosage 7.5mg  daily except 5mg  on Mondays, Wednesdays, and Fridays.  Recheck in 4 weeks.    Current Anticoagulation  Instructions: INR 3.0 Continue 7.5mg s daily except 5mg s on Mondays, Wednesdays and Fridays. Recheck in 4 weeks.

## 2010-04-27 NOTE — Letter (Signed)
Summary: Pre Visit Letter Revised  [facility]  [address] [place], [place] [address]   Phone: [phone]  Fax: [phone]        04/14/2010 MRN: [id number] [facility] [address] [place], [place]  [address]             Procedure Date:  05/02/10 @ 1:30 pm  Welcome to the Gastroenterology Division at [facility].    You are scheduled to see a nurse for your pre-procedure visit on Friday, 04/28/10 at 8:30 am on the 3rd floor at [facility], [address].  We ask that you try to arrive at our office 15 minutes prior to your appointment time to allow for check-in.  Please take a minute to review the attached form.  If you answer "Yes" to one or more of the questions on the first page, we ask that you call the person listed at your earliest opportunity.  If you answer "No" to all of the questions, please complete the rest of the form and bring it to your appointment.    Your nurse visit will consist of discussing your medical and surgical history, your immediate family medical history, and your medications.   If you are unable to list all of your medications on the form, please bring the medication bottles to your appointment and we will list them.  We will need to be aware of both prescribed and over the counter drugs.  We will need to know exact dosage information as well.    Please be prepared to read and sign documents such as consent forms, a financial agreement, and acknowledgement forms.  If necessary, and with your consent, a friend or relative is welcome to sit-in on the nurse visit with you.  Please bring your insurance card so that we may make a copy of it.  If your insurance requires a referral to see a specialist, please bring your referral form from your primary care physician.  No co-pay is required for this nurse visit.     If you cannot keep your appointment, please call [phone] to cancel or reschedule prior to your appointment date.   This allows [place] the opportunity to schedule an appointment for another patient in need of care.    Thank you for choosing [facility] for your medical needs.  We appreciate the opportunity to care for you.  Please visit [place] at our website  to learn more about our practice.  Sincerely, The Gastroenterology Division          Appended Document: Pre Visit Letter Revised Letter mailed to patient.

## 2010-04-27 NOTE — Medication Information (Signed)
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 no    Recent/future dental: no  Any missed doses?: no       Is patient compliant with meds? yes       Allergies: No Known Drug Allergies  Anticoagulation Management History:      The patient is taking warfarin and comes in today for a routine follow up visit.  Positive risk factors for bleeding include history of CVA/TIA.  Negative risk factors for bleeding include an age less than 24 years old.  The bleeding index is 'intermediate risk'.  Positive CHADS2 values include Prior Stroke/CVA/TIA.  Negative CHADS2 values include Age > 38 years old.  The start date was 08/09/2008.  Anticoagulation responsible Rifka Ramey: Bensimhon MD, Reuel Boom.  INR POC: 3.5.  Cuvette Lot#: [id number].  Exp: 12/2010.    Anticoagulation Management Assessment/Plan:      The patient's current anticoagulation dose is Coumadin 5 mg tabs: Take as directed by coumadin clinic..  The target INR is 2.5-3.5.  The next INR is due 04/04/2010.  Anticoagulation instructions were given to patient.  Results were reviewed/authorized by Weston Brass, PharmD.  He was notified by Weston Brass PharmD.         Prior Anticoagulation Instructions: INR 3.0 Continue 7.5mg s daily except 5mg s on Mondays, Wednesdays and Fridays. Recheck in 4 weeks.   Current Anticoagulation Instructions: INR 3.5  Continue same dose of 1 1/2 tablets every day except 1 tablet on Monday, Wednesday and Friday.  Recheck INR in 4 weeks.

## 2010-04-27 NOTE — Letter (Signed)
Summary: New Patient letter  [facility]  [address] [place], [place] [address]   Phone: [phone]  Fax: [phone]       03/28/2010 MRN: [id number]  [facility] [address] [place], [place]  [address]  Dear Mr. MOSCATO,  Welcome to the Gastroenterology Division at [facility].    You are scheduled to see Dr.  Christella Hartigan on 05-03-10 at 9:00a.m. on the 3rd floor at [facility], [address].  We ask that you try to arrive at ouroffice 15 minutes prior to your appointment time to allow for check-in.  We would like you to complete the enclosed self-administered evaluation form prior to your visit and bring it with you on the day of your appointment.  We will review it with you.  Also, please bring a complete list of all your medications or, if you prefer, bring the medication bottles and we will list them.  Please bring your insurance card so that we may make a copy of it.  If your insurance requires a referral to see a specialist, please bring your referral form from your primary care physician.  Co-payments are due at the time of your visit and may be paid by cash, check or credit card.     Your office visit will consist of a consult with your physician (includes a physical exam), any laboratory testing he/she may order, scheduling of any necessary diagnostic testing (e.g. x-ray, ultrasound, CT-scan), and scheduling of a procedure (e.g. Endoscopy, Colonoscopy) if required.  Please allow enough time on your schedule to allow for any/all of these possibilities.    If you cannot keep your appointment, please call [phone] to cancel or reschedule prior to your appointment date.  This allows [place] the opportunity to schedule an appointment for another patient in need of care.  If you do not cancel or reschedule by 5 p.m. the business day prior to your appointment date, you will be charged a $50.00 late cancellation/no-show fee.    Thank you for choosing [facility] for your medical needs.  We appreciate the opportunity to care for you.  Please visit [place] at our website  to learn more about our practice.                     Sincerely,                                                             The Gastroenterology Division

## 2010-04-27 NOTE — Assessment & Plan Note (Signed)
History of Present Illness Visit Type: Initial Consult Primary GI MD: Rob Bunting MD Primary Provider: Ronnald Nian, MD  Requesting Provider: Ronnald Nian, MD  Chief Complaint: Dysphagia  History of Present Illness:       Robert [name] who is here with his wife today.  who had a valve replacement 2 years ago, and also aortic valve 5 years ago.  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  he has had 2 CVAs, one with significant left sided weakness.  Then had metal AValve placed (2 years ago).  excercises a lot.  he has had pericarditis.  He has intermittent, solid food dysphagia.  started around the time of his second valve. This happens about twice a week.  Can get a vasovagal event following the dysphagia.  he has had no esophageal imaging.  Feels a pulling sensation when he extends his neck backwards.           Current Medications (verified): 1)  Coumadin 5 Mg Tabs (Warfarin Sodium) .... Take As Directed By Coumadin Clinic. 2)  Neurontin 800 Mg Tabs (Gabapentin) .... 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 Replacement of ascending aorta with 26 mm Vascutek Gelweave vascular prosthesis and aortic root replacement with Robert mm  Medtronic freestyle aortic root heart valve 04/17/05. 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 Jude Regent aortic prosthesis 07/16/08.    Family History: noncontributory grandparents had colon cancer  Social History: Married, remarried. Alcohol Use - yes..social Full Time Tobacco Use - No.  has 8 sons (3 biologic)    Review of Systems       Pertinent positive and negative review of systems were noted in the above HPI and GI specific review of systems.  All other review of systems was otherwise negative.   Vital Signs:  Patient profile:   50 year old male Height:      71 inches Weight:      194 pounds BMI:     Robert.16 BSA:     2.08 Pulse rate:   76 / minute Pulse rhythm:   regular BP sitting:   120 / 74  (left arm) Cuff size:   regular  Vitals Entered By: Ok Anis CMA (April 11, 2010 2:42 PM)  Physical Exam  Additional Exam:  Constitutional: generally well appearing Psychiatric: alert and oriented times 3 Eyes: extraocular movements intact Mouth: oropharynx moist, no lesions Neck: supple, no lymphadenopathy Cardiovascular: heart regular rate and rythm Lungs: CTA bilaterally Abdomen: soft, non-tender, non-distended, no obvious ascites, no peritoneal signs, normal bowel sounds Extremities: no lower extremity edema bilaterally Skin: no lesions on visible extremities    Impression & Recommendations:  Problem # 1:  Intermittent dysphasia he and his wife are both Robert bright people and wonder if his heart troubles, specifically the pericarditis has caused trouble with his esophagus. They wonder if he has scar tissue around his esophagus from the pericarditis. I can't say that I have really ever heard of that happening  before but it does make some biologic sense. 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 He is on Coumadin therapy for his mechanical valve and so any endoscopic procedures are higher risk. He'll get a barium esophagram and we will plan from there.  Problem # 2:  routine risk for colon cancer he is due for colon cancer screening ina bit over a year and if he is coming off of Coumadin for EGD, dilation we would want to proceed with colonoscopy at the same time  Patient Instructions: 1)  Barium esophagram. 2)  Will decide after that about EGD, colonoscopy and holding your coumadin. 3)  Chew your food well, eat slowly. 4)  A copy of this information will be sent to Dr. Susann Givens, Dr. Daleen Squibb. 5)  The medication list was reviewed and reconciled.  All changed / newly prescribed medications were explained.  A complete medication list was provided to the patient / caregiver.  Appended Document: Orders Update    Clinical Lists Changes  Problems: Added new problem of DYSPHAGIA UNSPECIFIED (ICD-787.20) Orders: Added new Test order of Barium Swallow (Barium Swallow) - Signed

## 2010-04-27 NOTE — Progress Notes (Signed)
Summary: Sooner appt.  Phone Note Other Incoming   Caller: Dr. Hewitt Blade [phone] Summary of Call: has an appt for 05-03-10 and requesting sooner appt. Pulling sensation in the middle of the chest, trouble swallowing Initial call taken by: Karna Christmas,  March 29, 2010 8:14 AM  Follow-up for Phone Call        pt has been rescheduled for 04/11/10 information was given to [place] at Dr Susann Givens office. Follow-up by: Chales Abrahams CMA Duncan Dull),  March 29, 2010 8:32 AM

## 2010-04-27 NOTE — Medication Information (Signed)
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 no    Recent/future dental: no  Any missed doses?: no       Is patient compliant with meds? yes       Allergies: No Known Drug Allergies  Anticoagulation Management History:      The patient is taking warfarin and comes in today for a routine follow up visit.  Positive risk factors for bleeding include history of CVA/TIA.  Negative risk factors for bleeding include an age less than 49 years old.  The bleeding index is 'intermediate risk'.  Positive CHADS2 values include Prior Stroke/CVA/TIA.  Negative CHADS2 values include Age > 49 years old.  The start date was 08/09/2008.  Anticoagulation responsible provider: Danniella Robben MD, Reuel Boom.  INR POC: 2.3.  Cuvette Lot#: [id number].  Exp: 04/2011.    Anticoagulation Management Assessment/Plan:      The patient's current anticoagulation dose is Coumadin 5 mg tabs: Take as directed by coumadin clinic..  The target INR is 2.5-3.5.  The next INR is due 05/02/2010.  Anticoagulation instructions were given to patient.  Results were reviewed/authorized by Cloyde Reams, RN, BSN.  He was notified by Cloyde Reams RN.         Prior Anticoagulation Instructions: INR 3.5  Continue same dose of 1 1/2 tablets every day except 1 tablet on Monday, Wednesday and Friday.  Recheck INR in 4 weeks.   Current Anticoagulation Instructions: INR 2.3  Take 2 tablets today, then resume same dosage 1.5 tablets daily except 1 tablet on Mondays, Wednesdays, and  Fridays.  Recheck in 4 weeks.

## 2010-04-28 ENCOUNTER — Ambulatory Visit: Admit: 2010-04-28 | Payer: Self-pay | Admitting: Gastroenterology

## 2010-04-28 ENCOUNTER — Telehealth: Payer: Self-pay | Admitting: Gastroenterology

## 2010-05-02 ENCOUNTER — Encounter: Payer: Self-pay | Admitting: Cardiovascular Disease

## 2010-05-02 ENCOUNTER — Other Ambulatory Visit: Payer: Self-pay | Admitting: Gastroenterology

## 2010-05-02 ENCOUNTER — Encounter (INDEPENDENT_AMBULATORY_CARE_PROVIDER_SITE_OTHER): Payer: BC Managed Care – PPO

## 2010-05-02 DIAGNOSIS — I359 Nonrheumatic aortic valve disorder, unspecified: Secondary | ICD-10-CM

## 2010-05-02 DIAGNOSIS — Z7901 Long term (current) use of anticoagulants: Secondary | ICD-10-CM

## 2010-05-02 LAB — CONVERTED CEMR LAB: POC INR: 3

## 2010-05-05 DIAGNOSIS — I82409 Acute embolism and thrombosis of unspecified deep veins of unspecified lower extremity: Secondary | ICD-10-CM | POA: Insufficient documentation

## 2010-05-05 DIAGNOSIS — I6350 Cerebral infarction due to unspecified occlusion or stenosis of unspecified cerebral artery: Secondary | ICD-10-CM

## 2010-05-05 DIAGNOSIS — Z8672 Personal history of thrombophlebitis: Secondary | ICD-10-CM

## 2010-05-05 DIAGNOSIS — Z954 Presence of other heart-valve replacement: Secondary | ICD-10-CM

## 2010-05-05 DIAGNOSIS — I359 Nonrheumatic aortic valve disorder, unspecified: Secondary | ICD-10-CM | POA: Insufficient documentation

## 2010-05-11 NOTE — Medication Information (Signed)
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 no    Recent/future dental: no  Any missed doses?: no       Is patient compliant with meds? yes       Allergies: No Known Drug Allergies  Anticoagulation Management History:      The patient is taking warfarin and comes in today for a routine follow up visit.  Positive risk factors for bleeding include history of CVA/TIA.  Negative risk factors for bleeding include an age less than 81 years old.  The bleeding index is 'intermediate risk'.  Positive CHADS2 values include Prior Stroke/CVA/TIA.  Negative CHADS2 values include Age > 85 years old.  The start date was 08/09/2008.  Anticoagulation responsible provider: Eden Emms MD, Theron Arista.  INR POC: 3.0.  Cuvette Lot#: [id number].  Exp: 04/2011.    Anticoagulation Management Assessment/Plan:      The patient's current anticoagulation dose is Coumadin 5 mg tabs: Take as directed by coumadin clinic..  The target INR is 2.5-3.5.  The next INR is due 05/30/2010.  Anticoagulation instructions were given to patient.  Results were reviewed/authorized by Weston Brass, PharmD.  He was notified by Margot Chimes PharmD Candidate.         Prior Anticoagulation Instructions: INR 2.3  Take 2 tablets today, then resume same dosage 1.5 tablets daily except 1 tablet on Mondays, Wednesdays, and Fridays.  Recheck in 4 weeks.   Current Anticoagulation Instructions: INR 3.0  Continue current Coumadin dose of 1 1/2 tablets everyday except 1 tablet on Mondays, Wednesdays, and  Fridays.

## 2010-05-11 NOTE — Progress Notes (Signed)
Summary: Patient CX'd EGD  Phone Note Call from Patient   Caller: Patient Call For: Dr Christella Hartigan Details for Reason: pt. wants to cx egd Summary of Call: Mr. Valletta said since his barium esophogram was normal and he has had PT for his swallowing problem, he no longer has the sensation in his chest he was having and no longer has dysphagia.  He requested I cancel his EGD.  He says he never started on Prilosec and didn't think he needed it. Initial call taken by: Wyona Almas RN,  April 28, 2010 9:07 AM  Follow-up for Phone Call        that is ok Follow-up by: Rachael Fee MD,  April 30, 2010 6:08 PM

## 2010-05-30 ENCOUNTER — Encounter (INDEPENDENT_AMBULATORY_CARE_PROVIDER_SITE_OTHER): Payer: BC Managed Care – PPO

## 2010-05-30 ENCOUNTER — Encounter: Payer: Self-pay | Admitting: Cardiology

## 2010-05-30 DIAGNOSIS — I359 Nonrheumatic aortic valve disorder, unspecified: Secondary | ICD-10-CM

## 2010-05-30 DIAGNOSIS — Z7901 Long term (current) use of anticoagulants: Secondary | ICD-10-CM

## 2010-05-30 LAB — CONVERTED CEMR LAB: POC INR: 2.9

## 2010-06-01 ENCOUNTER — Ambulatory Visit (INDEPENDENT_AMBULATORY_CARE_PROVIDER_SITE_OTHER): Payer: BC Managed Care – PPO | Admitting: Family Medicine

## 2010-06-01 ENCOUNTER — Emergency Department (HOSPITAL_COMMUNITY)
Admission: EM | Admit: 2010-06-01 | Discharge: 2010-06-02 | Disposition: A | Discharge status: Home / Self Care | Payer: BC Managed Care – PPO | Attending: Emergency Medicine | Admitting: Emergency Medicine

## 2010-06-01 DIAGNOSIS — W1809XA Striking against other object with subsequent fall, initial encounter: Secondary | ICD-10-CM | POA: Insufficient documentation

## 2010-06-01 DIAGNOSIS — R51 Headache: Secondary | ICD-10-CM | POA: Insufficient documentation

## 2010-06-01 DIAGNOSIS — R209 Unspecified disturbances of skin sensation: Secondary | ICD-10-CM

## 2010-06-01 DIAGNOSIS — S0990XA Unspecified injury of head, initial encounter: Secondary | ICD-10-CM | POA: Insufficient documentation

## 2010-06-01 DIAGNOSIS — Z79899 Other long term (current) drug therapy: Secondary | ICD-10-CM | POA: Insufficient documentation

## 2010-06-01 DIAGNOSIS — Z86718 Personal history of other venous thrombosis and embolism: Secondary | ICD-10-CM | POA: Insufficient documentation

## 2010-06-01 DIAGNOSIS — Y92009 Unspecified place in unspecified non-institutional (private) residence as the place of occurrence of the external cause: Secondary | ICD-10-CM | POA: Insufficient documentation

## 2010-06-01 DIAGNOSIS — Z7901 Long term (current) use of anticoagulants: Secondary | ICD-10-CM | POA: Insufficient documentation

## 2010-06-01 HISTORY — DX: Acute and subacute infective endocarditis: I33.0

## 2010-06-01 HISTORY — DX: Acute embolism and thrombosis of unspecified deep veins of unspecified lower extremity: I82.409

## 2010-06-01 HISTORY — DX: Presence of prosthetic heart valve: Z95.2

## 2010-06-02 ENCOUNTER — Encounter (HOSPITAL_COMMUNITY): Payer: Self-pay | Admitting: Radiology

## 2010-06-02 ENCOUNTER — Emergency Department (HOSPITAL_COMMUNITY): Payer: BC Managed Care – PPO

## 2010-06-02 LAB — CBC
Platelets: 254 10*3/uL (ref 150–400)
RBC: 4.95 MIL/uL (ref 4.22–5.81)
WBC: 5.5 10*3/uL (ref 4.0–10.5)

## 2010-06-02 LAB — DIFFERENTIAL
Basophils Relative: 1 % (ref 0–1)
Eosinophils Absolute: 0.1 10*3/uL (ref 0.0–0.7)
Neutrophils Relative %: 37 % — ABNORMAL LOW (ref 43–77)

## 2010-06-02 LAB — POCT I-STAT, CHEM 8
Calcium, Ion: 1.18 mmol/L (ref 1.12–1.32)
HCT: 43 % (ref 39.0–52.0)
Hemoglobin: 14.6 g/dL (ref 13.0–17.0)
TCO2: 25 mmol/L (ref 0–100)

## 2010-06-02 LAB — PROTIME-INR: Prothrombin Time: 34.3 seconds — ABNORMAL HIGH (ref 11.6–15.2)

## 2010-06-02 LAB — APTT: aPTT: 45 seconds — ABNORMAL HIGH (ref 24–37)

## 2010-06-06 NOTE — Medication Information (Signed)
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 no    Recent/future dental: no  Any missed doses?: yes     Details: Missed 1 tablet 1 week ago last Tuesday, took 1 tablet the next am.   Is patient compliant with meds? yes       Allergies: No Known Drug Allergies  Anticoagulation Management History:      The patient is taking warfarin and comes in today for a routine follow up visit.  Positive risk factors for bleeding include history of CVA/TIA.  Negative risk factors for bleeding include an age less than 4 years old.  The bleeding index is 'intermediate risk'.  Positive CHADS2 values include Prior Stroke/CVA/TIA.  Negative CHADS2 values include Age > 39 years old.  The start date was 08/09/2008.  Anticoagulation responsible provider: Eden Emms MD, Theron Arista.  INR POC: 2.9.  Cuvette Lot#: [id number].  Exp: 03/2011.    Anticoagulation Management Assessment/Plan:      The patient's current anticoagulation dose is Coumadin 5 mg tabs: Take as directed by coumadin clinic..  The target INR is 2.5-3.5.  The next INR is due 06/27/2010.  Anticoagulation instructions were given to patient.  Results were reviewed/authorized by Weston Brass, PharmD.  He was notified by Cloyde Reams RN.         Prior Anticoagulation Instructions: INR 3.0  Continue current Coumadin dose of 1 1/2 tablets everyday except 1 tablet on Mondays, Wednesdays, and Fridays.   Current Anticoagulation Instructions: INR 2.9  Continue on same dosage 1.5 tablets daily except 1 tablet on Mondays, Wednesdays, and  Fridays.  Recheck in 4 weeks.

## 2010-06-27 ENCOUNTER — Ambulatory Visit (INDEPENDENT_AMBULATORY_CARE_PROVIDER_SITE_OTHER): Payer: BC Managed Care – PPO | Admitting: *Deleted

## 2010-06-27 DIAGNOSIS — I82409 Acute embolism and thrombosis of unspecified deep veins of unspecified lower extremity: Secondary | ICD-10-CM

## 2010-06-27 DIAGNOSIS — Z8672 Personal history of thrombophlebitis: Secondary | ICD-10-CM

## 2010-06-27 DIAGNOSIS — I6350 Cerebral infarction due to unspecified occlusion or stenosis of unspecified cerebral artery: Secondary | ICD-10-CM

## 2010-06-27 DIAGNOSIS — Z7901 Long term (current) use of anticoagulants: Secondary | ICD-10-CM

## 2010-06-27 DIAGNOSIS — Z954 Presence of other heart-valve replacement: Secondary | ICD-10-CM

## 2010-06-27 DIAGNOSIS — I359 Nonrheumatic aortic valve disorder, unspecified: Secondary | ICD-10-CM

## 2010-07-05 LAB — DIFFERENTIAL
Basophils Absolute: 0 10*3/uL (ref 0.0–0.1)
Basophils Relative: 1 % (ref 0–1)
Monocytes Relative: 7 % (ref 3–12)
Neutro Abs: 4.3 10*3/uL (ref 1.7–7.7)
Neutrophils Relative %: 68 % (ref 43–77)

## 2010-07-05 LAB — COMPREHENSIVE METABOLIC PANEL
Alkaline Phosphatase: 75 U/L (ref 39–117)
BUN: 18 mg/dL (ref 6–23)
CO2: 28 mEq/L (ref 19–32)
GFR calc non Af Amer: >60 mL/min (ref >60)
Glucose, Bld: 104 mg/dL — ABNORMAL HIGH (ref 70–99)
Potassium: 3.9 mEq/L (ref 3.5–5.1)
Total Protein: 6.3 g/dL (ref 6.0–8.3)

## 2010-07-05 LAB — PROTIME-INR
INR: 1.1 (ref 0.00–1.49)
Prothrombin Time: 14.9 seconds (ref 11.6–15.2)

## 2010-07-05 LAB — APTT: aPTT: 31 seconds (ref 24–37)

## 2010-07-05 LAB — CBC
Hemoglobin: 12.7 g/dL — ABNORMAL LOW (ref 13.0–17.0)
MCHC: 34.1 g/dL (ref 30.0–36.0)
MCV: 85.3 fL (ref 78.0–100.0)
RDW: 13.7 % (ref 11.5–15.5)

## 2010-07-05 LAB — POCT I-STAT, CHEM 8
Calcium, Ion: 1.1 mmol/L — ABNORMAL LOW (ref 1.12–1.32)
Creatinine, Ser: 1 mg/dL (ref 0.4–1.5)
Glucose, Bld: 109 mg/dL — ABNORMAL HIGH (ref 70–99)
HCT: 37 % — ABNORMAL LOW (ref 39.0–52.0)
Hemoglobin: 12.6 g/dL — ABNORMAL LOW (ref 13.0–17.0)
Potassium: 3.9 mEq/L (ref 3.5–5.1)
TCO2: 23 mmol/L (ref 0–100)

## 2010-07-05 LAB — CK TOTAL AND CKMB (NOT AT ARMC)
Relative Index: INVALID (ref 0.0–2.5)
Total CK: 49 U/L (ref 7–232)

## 2010-07-06 LAB — ANA: Anti Nuclear Antibody(ANA): NEGATIVE

## 2010-07-06 LAB — CBC
HCT: 37 % — ABNORMAL LOW (ref 39.0–52.0)
HCT: 40.9 % (ref 39.0–52.0)
Hemoglobin: 12.9 g/dL — ABNORMAL LOW (ref 13.0–17.0)
Hemoglobin: 13.7 g/dL (ref 13.0–17.0)
Hemoglobin: 14.4 g/dL (ref 13.0–17.0)
MCHC: 34.2 g/dL (ref 30.0–36.0)
MCHC: 34.4 g/dL (ref 30.0–36.0)
MCHC: 34.3 g/dL (ref 30.0–36.0)
MCV: 86.3 fL (ref 78.0–100.0)
MCV: 87.1 fL (ref 78.0–100.0)
Platelets: 270 10*3/uL (ref 150–400)
Platelets: 279 10*3/uL (ref 150–400)
Platelets: 281 10*3/uL (ref 150–400)
Platelets: 283 10*3/uL (ref 150–400)
Platelets: 314 10*3/uL (ref 150–400)
RBC: 4.37 MIL/uL (ref 4.22–5.81)
RBC: 4.65 MIL/uL (ref 4.22–5.81)
RBC: 4.76 MIL/uL (ref 4.22–5.81)
RDW: 11.8 % (ref 11.5–15.5)
RDW: 12.3 % (ref 11.5–15.5)
RDW: 12.5 % (ref 11.5–15.5)
RDW: 12.6 % (ref 11.5–15.5)
WBC: 7.7 10*3/uL (ref 4.0–10.5)
WBC: 8.2 10*3/uL (ref 4.0–10.5)

## 2010-07-06 LAB — CULTURE, BLOOD (ROUTINE X 2)

## 2010-07-06 LAB — BASIC METABOLIC PANEL
BUN: 21 mg/dL (ref 6–23)
CO2: 28 mEq/L (ref 19–32)
Calcium: 8.5 mg/dL (ref 8.4–10.5)
Calcium: 8.7 mg/dL (ref 8.4–10.5)
Chloride: 102 mEq/L (ref 96–112)
Creatinine, Ser: 1.03 mg/dL (ref 0.4–1.5)
GFR calc non Af Amer: >60 mL/min (ref >60)
Glucose, Bld: 105 mg/dL — ABNORMAL HIGH (ref 70–99)
Glucose, Bld: 108 mg/dL — ABNORMAL HIGH (ref 70–99)
Potassium: 3.7 mEq/L (ref 3.5–5.1)
Sodium: 137 mEq/L (ref 135–145)

## 2010-07-06 LAB — POCT I-STAT, CHEM 8
HCT: 40 % (ref 39.0–52.0)
Hemoglobin: 13.6 g/dL (ref 13.0–17.0)
Potassium: 4.2 mEq/L (ref 3.5–5.1)
Sodium: 137 mEq/L (ref 135–145)
TCO2: 27 mmol/L (ref 0–100)

## 2010-07-06 LAB — PROTIME-INR
INR: 1.3 (ref 0.00–1.49)
INR: 1.4 (ref 0.00–1.49)
INR: 1.7 — ABNORMAL HIGH (ref 0.00–1.49)
INR: 1.8 — ABNORMAL HIGH (ref 0.00–1.49)
INR: 2.3 — ABNORMAL HIGH (ref 0.00–1.49)
INR: 2.4 — ABNORMAL HIGH (ref 0.00–1.49)
INR: 2.7 — ABNORMAL HIGH (ref 0.00–1.49)
INR: 3.1 — ABNORMAL HIGH (ref 0.00–1.49)
INR: 3.4 — ABNORMAL HIGH (ref 0.00–1.49)
INR: 1.2 (ref 0.00–1.49)
Prothrombin Time: 16.3 seconds — ABNORMAL HIGH (ref 11.6–15.2)
Prothrombin Time: 17.4 seconds — ABNORMAL HIGH (ref 11.6–15.2)
Prothrombin Time: 20.3 seconds — ABNORMAL HIGH (ref 11.6–15.2)
Prothrombin Time: 21.4 seconds — ABNORMAL HIGH (ref 11.6–15.2)
Prothrombin Time: 26.4 seconds — ABNORMAL HIGH (ref 11.6–15.2)
Prothrombin Time: 27.4 seconds — ABNORMAL HIGH (ref 11.6–15.2)
Prothrombin Time: 28 seconds — ABNORMAL HIGH (ref 11.6–15.2)
Prothrombin Time: 30.4 seconds — ABNORMAL HIGH (ref 11.6–15.2)
Prothrombin Time: 15.2 seconds (ref 11.6–15.2)

## 2010-07-06 LAB — LUPUS ANTICOAGULANT PANEL
DRVVT: 48.3 secs — ABNORMAL HIGH (ref 36.1–47.0)
PTTLA 4:1 Mix: 109.3 secs — ABNORMAL HIGH (ref 36.3–48.8)
dRVVT Incubated 1:1 Mix: 41.2 secs (ref 36.1–47.0)

## 2010-07-06 LAB — CARDIOLIPIN ANTIBODIES, IGM+IGG
Anticardiolipin IgG: <7 [GPL'U] — ABNORMAL LOW (ref <11)
Anticardiolipin IgM: 8 [MPL'U] — ABNORMAL LOW (ref <10)

## 2010-07-06 LAB — D-DIMER, QUANTITATIVE: D-Dimer, Quant: 0.76 ug/mL-FEU — ABNORMAL HIGH (ref 0.00–0.48)

## 2010-07-06 LAB — APC RESISTANCE: Activated Protein C Resistance: 0.95

## 2010-07-06 LAB — DIFFERENTIAL
Basophils Absolute: 0 10*3/uL (ref 0.0–0.1)
Basophils Relative: 0 % (ref 0–1)
Eosinophils Absolute: 0 10*3/uL (ref 0.0–0.7)
Lymphocytes Relative: 16 % (ref 12–46)
Lymphs Abs: 1.1 10*3/uL (ref 0.7–4.0)
Lymphs Abs: 1.3 10*3/uL (ref 0.7–4.0)
Monocytes Relative: 3 % (ref 3–12)
Neutro Abs: 6.3 10*3/uL (ref 1.7–7.7)
Neutro Abs: 6.3 10*3/uL (ref 1.7–7.7)
Neutrophils Relative %: 77 % (ref 43–77)
Neutrophils Relative %: 82 % — ABNORMAL HIGH (ref 43–77)

## 2010-07-06 LAB — COMPREHENSIVE METABOLIC PANEL
ALT: 15 U/L (ref 0–53)
AST: 17 U/L (ref 0–37)
Alkaline Phosphatase: 75 U/L (ref 39–117)
CO2: 31 mEq/L (ref 19–32)
Calcium: 8.8 mg/dL (ref 8.4–10.5)
Chloride: 101 mEq/L (ref 96–112)
GFR calc Af Amer: >60 mL/min (ref >60)
GFR calc non Af Amer: >60 mL/min (ref >60)
Glucose, Bld: 103 mg/dL — ABNORMAL HIGH (ref 70–99)
Potassium: 3.9 mEq/L (ref 3.5–5.1)
Sodium: 137 mEq/L (ref 135–145)

## 2010-07-06 LAB — SEDIMENTATION RATE: Sed Rate: 40 mm/hr — ABNORMAL HIGH (ref 0–16)

## 2010-07-06 LAB — TSH: TSH: 1.144 u[IU]/mL (ref 0.350–4.500)

## 2010-07-06 LAB — PROTEIN C, TOTAL: Protein C, Total: 70 % (ref 70–140)

## 2010-07-06 LAB — LIPID PANEL: VLDL: 10 mg/dL (ref 0–40)

## 2010-07-06 LAB — T3: T3, Total: 115.8 ng/dl (ref 80.0–204.0)

## 2010-07-06 LAB — PROGESTERONE: Progesterone: 0.3 ng/mL (ref 0.3–1.2)

## 2010-07-06 LAB — NEURONAL NUCLEAR AUTOABS(IFA), IGG-BLD: ANNA IFA Titer Screen: <1:10 {titer}

## 2010-07-06 LAB — APTT
aPTT: 52 seconds — ABNORMAL HIGH (ref 24–37)
aPTT: 35 seconds (ref 24–37)

## 2010-07-06 LAB — PLT GLYCOPROTEIN (INDIRECT) AUTOABS: Plt GP Ia/IIa Autoabs: DETECTED

## 2010-07-26 ENCOUNTER — Other Ambulatory Visit: Payer: Self-pay | Admitting: Cardiology

## 2010-07-27 ENCOUNTER — Ambulatory Visit (INDEPENDENT_AMBULATORY_CARE_PROVIDER_SITE_OTHER): Payer: BC Managed Care – PPO | Admitting: *Deleted

## 2010-07-27 DIAGNOSIS — I6350 Cerebral infarction due to unspecified occlusion or stenosis of unspecified cerebral artery: Secondary | ICD-10-CM

## 2010-07-27 DIAGNOSIS — Z8672 Personal history of thrombophlebitis: Secondary | ICD-10-CM

## 2010-07-27 DIAGNOSIS — I82409 Acute embolism and thrombosis of unspecified deep veins of unspecified lower extremity: Secondary | ICD-10-CM

## 2010-07-27 DIAGNOSIS — I359 Nonrheumatic aortic valve disorder, unspecified: Secondary | ICD-10-CM

## 2010-07-27 DIAGNOSIS — Z954 Presence of other heart-valve replacement: Secondary | ICD-10-CM

## 2010-07-27 LAB — POCT INR: INR: 2.4

## 2010-07-27 NOTE — Telephone Encounter (Signed)
Refill

## 2010-08-08 NOTE — Discharge Summary (Signed)
[name], [name] NO.:  [id number]   MEDICAL RECORD NO.:  [id number]          PATIENT TYPE:  INP   LOCATION:  4702                         FACILITY:  [facility]   PHYSICIAN:  Cristy Hilts. Jacinto Halim, MD       DATE OF BIRTH:  July 21, 1960   DATE OF ADMISSION:  06/13/2008  DATE OF DISCHARGE:  06/14/2008                               DISCHARGE SUMMARY   DISCHARGE DIAGNOSES:  1. Possible transient ischemic attack with symptoms of word-finding      difficulty and minimal dysarthria.  2. History of recent deep vein thrombosis, on Coumadin with INR      therapeutic on admission of 2.6.  3. 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   PROCEDURES:  MRI and MRA showing negative intracranial MRA.  He did have  a small left middle cerebral artery M1 segment infundibulum suspected.  He had a carotid duplex scan showing vertebral artery flow antegrade  bilaterally.  No significant right or left internal carotid artery  stenosis.   DISCHARGE MEDICATIONS:  1. Coumadin 5 mg daily.  2. Omeprazole 40 mg a day.  3. Aspirin 81 mg a day.   Last hemoglobin 13.7, hematocrit 40.9, platelets 314, and WBCs 8.2.  INR  was 2.3.  Sodium 137, potassium 3.7, chloride 102, CO2 of 29, glucose  105, BUN 19, and creatinine 0.97.   CONSULTS:  Robert Christmas, MD   HOSPITAL COURSE:  [name] came to the emergency room with speech  difficulty.  He was seen by Dr. Jacinto Halim.  He is an Electronics engineer and an avid  runner.  He had awakened the morning of admission with some speech  difficulty.  Recently, he had been placed on Coumadin for a DVT.  He was  seen by Dr. Jacinto Halim, and a neuro consult was called.  He was seen by Dr.  Roseanne Reno, who recommended carotid Dopplers and MRI, MRA of brain without  contrast.  He did not recommend any changes in his Coumadin or aspirin.   Procedures were performed on June 14, 2008, and he was considered  stable for discharge home.  Dr. Jacinto Halim had  planned on talking to the  neurologist about his symptoms as MRI and MRA as an outpatient.       Lezlie Octave, N.P.      Cristy Hilts. Jacinto Halim, MD  Electronically Signed    BB/MEDQ  D:  07/26/2008  T:  07/27/2008  Job:  [id number]   cc:   Robert Christmas, MD

## 2010-08-08 NOTE — Discharge Summary (Signed)
Robert [name], Robert NO.:  [id number]   MEDICAL RECORD NO.:  [id number]          PATIENT TYPE:  INP   LOCATION:  4712                         FACILITY:  [facility]   PHYSICIAN:  Cristy Hilts. Jacinto Halim, MD       DATE OF BIRTH:  03/31/1960   DATE OF ADMISSION:  06/07/2008  DATE OF DISCHARGE:  06/10/2008                               DISCHARGE SUMMARY   DISCHARGE DIAGNOSES:  1. Deep vein thrombosis, left peroneal vein.  2. History of bicuspid aortic valve, status post Bentall procedure,      January 2007, in [place].  3. Coumadin therapy secondary to deep vein thrombosis, left peroneal      vein.   HOSPITAL COURSE:  The patient is a 50 year old male followed by Dr.  Jacinto Halim and Dr. Sharlot Gowda.  He recently had been skiing at [place].  He had some type of injury to his calf.  He developed some pain in his  left calf of few days later.  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 Jacinto Halim saw him at home and set him up for a venous ultrasound which  confirmed a left peroneal DVT.  He is admitted to telemetry for pain  control and Lovenox or Coumadin.  By this time, his left lower extremity  was quite symptomatic.  Dr. Alanda Amass also ordered a hypercoagulable  workup prior to initiating Lovenox or Coumadin.  The patient has  improved symptomatically to the point we feel he can be discharged on  June 10, 2008.  His INR is 1.8.  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   DISCHARGE MEDICATIONS:  1. Aspirin 81 mg a day.  2. Fish oil daily.  3. Vitamin D 100 mg a day.  4. Protonix 40 mg a day while on Coumadin.  5. Lovenox 80 mg subcu q.12 through Saturday evening.  6. Coumadin 7.5, Thursday, Saturday, and Monday, and 5 mg other days.   LABORATORY DATA:  White count 5.9, hemoglobin 13.7, hematocrit 39.4,  platelets 281, INR 1.8.  Lipid panel shows cholesterol 142, LDL 27, HDL  105.  ANA was negative.   Lupus anticoagulant was elevated.  Chest x-ray  shows a prominent main pulmonary artery segment, films suggested  pulmonary artery hypertension.  EKG from the office shows sinus rhythm,  voltage criteria for LVH.   DISPOSITION:  The patient was discharged in stable condition and will  follow up with Dr. Jacinto Halim.  He will have a protime on Monday, his INR  goal was 2-3.      Abelino Derrick, P.A.      Cristy Hilts. Jacinto Halim, MD  Electronically Signed    LKK/MEDQ  D:  06/10/2008  T:  06/10/2008  Job:  [id number]

## 2010-08-08 NOTE — Consult Note (Signed)
Robert [name], Robert [name] NO.:  [id number]   MEDICAL RECORD NO.:  [id number]          PATIENT TYPE:  EMS   LOCATION:  MAJO                         FACILITY:  [facility]   PHYSICIAN:  Noel Christmas, MD    DATE OF BIRTH:  10-17-1960   DATE OF CONSULTATION:  07/10/2008  DATE OF DISCHARGE:  07/10/2008                                 CONSULTATION   REFERRING PHYSICIAN:  Celene Kras, MD, [facility].   REASON FOR CONSULTATION:  Recurrent acute stroke versus TIA.  The  patient presented as code stroke.   HISTORY OF PRESENT ILLNESS:  This is a 50 year old man who was seen by  me 4 weeks ago following acute onset of speech difficulty and facial  weakness.  Subsequent MRI showed acute right subcortical infarction,  which was small.  Further workup with TEE because of his known aortic  valve disease, showed evidence of valvular vegetations with findings  indicative of subacute bacterial endocarditis.  The patient was on  anticoagulation at the time of evaluation 4 weeks ago with Coumadin and  was therapeutic.  Coumadin was discontinued.  He is on no antiplatelet  therapy as well because of potential bleeding risk with embolic  phenomena from SBE.  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  He is  currently on Rocephin 2 g per day and gentamicin 500 mg b.i.d.   The onset of his symptoms was at 11:45.  CT scan was available at 12:15,  and an initial neurological exam was done at 12:20.  The patient had an  NIH score of 1 (numbness).  TPA was not given.  CT scan of his head  showed no acute abnormalities.  Subsequent MRI showed evidence of an  acute posterior insular infarction on the right.  There was equivocal  small amount of hemorrhage in the same region as well, but no signs of  frank bleeding.   PAST MEDICAL HISTORY:  Remarkable for DVT, SBE, aortic valve disease  with prostatic valve  replacement, and subacute subcortical infarction (4  weeks ago).   CURRENT MEDICATIONS:  1. Rocephin 2 g per day.  2. Gentamicin 500 mg b.i.d.  3. Prilosec 40 mg per day.  4. Colace 100 mg per day.   FAMILY HISTORY:  Noncontributory.   PHYSICAL EXAMINATION:  GENERAL:  Appearance is that of young to early-  middle-aged-appearing man, slim-to-build who was alert and cooperative  and in no acute distress.  He is well oriented to time as well as place.  Short-term and long-term memory were normal.  Affect was appropriate.  HEENT:  Pupils were equal and reacted normally to light.  NEUROLOGIC:  Extraocular movements were full and conjugate.  Visual  fields were intact and normal.  There was no facial numbness and no  facial weakness.  Hearing was normal.  Speech was normal.  Motor exam  showed normal strength throughout.  Sensory evaluation showed reduced  perception and tactile stimulation over his left hand and forearm.  Sensory exam  was otherwise normal.  Deep tendon reflexes were normal and  symmetrical.  Coordination was normal including left upper extremity.  Carotid auscultation was normal.   CLINICAL IMPRESSION:  1. 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  2. Subacute right subcortical infarction, stable.  3. 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   RECOMMENDATIONS:  1. Aspirin 81 mg per day.  2. Continue outpatient antibiotic regimen as described above.  3. Followup with Cardiology as planned.  4. No indications for further neurological intervention, nor      indications for rehab.   Thank you for asking me to reevaluate Mr. Cotten.      Noel Christmas, MD  Electronically Signed     CS/MEDQ  D:  07/10/2008  T:  07/11/2008  Job:  [id number]

## 2010-08-08 NOTE — Discharge Summary (Signed)
NAMEHURSCHEL, PAYNTER NO.:  [id number]   MEDICAL RECORD NO.:  [id number]          PATIENT TYPE:  INP   LOCATION:  3715                         FACILITY:  [facility]   PHYSICIAN:  Cristy Hilts. Jacinto Halim, MD       DATE OF BIRTH:  10-02-1960   DATE OF ADMISSION:  06/16/2008  DATE OF DISCHARGE:  06/21/2008                               DISCHARGE SUMMARY   DISCHARGE DIAGNOSES:  1. Aortic valve endocarditis, probably in January..  2. History of Bentall procedure with a 29-mm Medtronic freestyle      aortic root heart valve with ascending aortic Vascutek woven      vascular prosthesis, placed in January 2007 by Dr. Cathren Harsh in      [place], [place] for bicuspid valve.  3. Recent deep vein thrombosis with hospitalization from June 07, 2008, to June 10, 2008, sent home on Coumadin therapy, now      discontinued for high risk of vegetation embolus from his      endocarditis.  4. 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  5. History of migraine headaches.   LABORATORY DATA:  Blood cultures x2 grew viridans streptococcus.  Gentamicin trough on June 19, 2008, was 0.8.  Sodium 139, potassium  4.0, chloride 102, CO2 of 28, glucose 108, BUN 21, and creatinine 1.03.  ESR was 40.  Hemoglobin 13.7, hematocrit 40.1, WBC 7.7, and platelets  were 283.   Chest x-ray on June 21, 2008, showed right upper extremity PICC line in  the right atrium of approximately 3 cm below the cavoatrial junction and  no active disease.   HOSPITAL COURSE:  Mr. Raesean Bartoletti. Herberg was admitted to the hospital after  undergoing a TEE by Dr. Jacinto Halim which showed vegetation involving  noncoronary cusp of his AV prosthetic valve.  He was admitted for IV  antibiotics.  He apparently has a prior medical history of bicuspid  aortic valve with combined AS and AI.  He apparently underwent cardiac  cath and then a Bentall procedure in [place], [place] with a  porcine  AV valve and an ascending aortic Vascutek woven vascular prosthesis.  He  apparently had been not feeling well with problems with upper  respiratory infection, general malaise, and fatigue on and off for  approximately 3 months.  He was admitted in March for peroneal DVT.  He  was then readmitted for TIA symptoms on June 13, 2008.  Because of the  results of his MRI and MRA, it was decided he should undergo TEE.  This  was performed and he was found to have vegetation on his AV valve.  He  was then admitted for IV antibiotics.  He was seen by Dr. Orvan Falconer with  Infectious Disease.  He was placed on Ancef plus gentamicin.  The blood  cultures revealed viridans streptococcus.  It was decided to stop his  Coumadin that he had been on previously for his peroneal DVT because of  high risk for vegetative embolus.  He was seen by Dr. Orvan Falconer on June 21, 2008.  His Ancef was switched to Rocephin which he plans on giving  him through Jul 28, 2008 and his gentamicin through June 30, 2008.  He  was seen by Case Management.  Home Health was set up.  He will be seen  by advanced to administer some of his antibiotics.  His wife will also  help.  She is a International aid/development worker.  He was seen by Dr. Garen Lah on June 21, 2008, considered stable for discharge home.  He did have some problems  with abdominal discomfort.  However, apparently, the patient was  insisting on going home instead of staying for this to be evaluated.   DISCHARGE MEDICATIONS:  1. Aspirin 325 mg a day.  2. Omeprazole 20 mg daily.  3. Rocephin 2 g daily through Jul 28, 2008.  4. Gentamicin 80 mg every 8 hours.   He will follow up with Dr. Orvan Falconer in the [facility] Clinic in 3-4 weeks and he  will follow up with Dr. Jacinto Halim in approximately 2 weeks.      Lezlie Octave, N.P.      Cristy Hilts. Jacinto Halim, MD  Electronically Signed    BB/MEDQ  D:  06/21/2008  T:  06/22/2008  Job:  [id number]   cc:   Cliffton Asters, M.D.  Sharlot Gowda, M.D.

## 2010-08-08 NOTE — Consult Note (Signed)
Robert [name], Robert [name] NO.:  [id number]   MEDICAL RECORD NO.:  [id number]          PATIENT TYPE:  INP   LOCATION:  4702                         FACILITY:  [facility]   PHYSICIAN:  Robert Christmas, MD    DATE OF BIRTH:  29-Dec-1960   DATE OF CONSULTATION:  DATE OF DISCHARGE:                                 CONSULTATION   REFERRING PHYSICIAN:  Vonna Kotyk R. Jacinto Halim, MD   REASON FOR CONSULTATION:  Rule out TIA versus stroke.   HISTORY OF PRESENT ILLNESS:  This is a 50 year old man with a history of  recent hospitalization for DVT involving his left leg and discharged on  June 10, 2008, on Coumadin and aspirin.  He is presenting with onset of  left facial weakness and speech difficulty.  He noticed mild speech  changes on waking up this morning.  Speech difficulty worsened around  noon today.  He developed onset of left facial weakness at about the  same time.  He had difficulty forming words for pronunciation.  There  was slurring of speech as well.  CT scan of his head was unremarkable  with no acute intracranial abnormality.  He had no extremity weakness  nor numbness on the left at anytime.  His INR today was 2.3.  Workup  during his recent hospitalization was negative except for positive test  for lupus anticoagulant antibody.  The patient has a history of migraine  headaches with associated speech difficulty.  Speech changes associated  with his migraine headaches are clearly different from symptoms that he  presented with today.   PAST MEDICAL HISTORY:  Remarkable for DVT of the left leg, aortic valve  replacement, and aortic aneurysm repair.   CURRENT MEDICATIONS:  1. Coumadin 7.5 mg per day.  2. Aspirin 81 mg per day.  3. Fish oil daily.  4. Protonix 40 mg per day.  5. Vitamin D 100 mg per day.   FAMILY HISTORY:  Positive for stroke involving his mother.   PHYSICAL EXAMINATION:  GENERAL:  Appearance was that of a slender middle-  aged man, who was alert and  cooperative in no acute distress.  Mental  status was normal.  He had no speech difficulty.  HEENT:  His pupils were equal and reactive normally to light.  Extraocular movements were full and conjugate.  Visual fields were  intact and normal.  There was no facial numbness nor facial weakness.  Hearing was normal.  Speech and palate movement were normal.  NEUROLOGIC:  Coordination of extremities was normal.  Strength and  muscle tone were normal throughout including no pronator drift of his  left upper extremity.  Deep tendon reflexes were normal and symmetrical  throughout.  Plantar responses were flexor.  Sensory examination was  normal.  Carotid auscultation was normal.   CLINICAL IMPRESSION:  1. Probable transient ischemic attack involving the right middle      cerebral artery territory, most likely with small vessel      subcortical focal ischemia.  2. Adequate anticoagulation, on Coumadin with an INR of 2.3 in  addition to being treated with antiplatelet therapy with aspirin.  3. Lupus anticoagulant antibody of unclear significance.   RECOMMENDATIONS:  1. MRI and MRA of the brain without contrast.  2. Carotid Doppler study.  3. No changes in current doses of Coumadin and aspirin for now.   Thank you for asking me to evaluate Robert [name].      Robert Christmas, MD  Electronically Signed     CS/MEDQ  D:  06/13/2008  T:  06/14/2008  Job:  [id number]

## 2010-08-24 ENCOUNTER — Ambulatory Visit (INDEPENDENT_AMBULATORY_CARE_PROVIDER_SITE_OTHER): Payer: BC Managed Care – PPO | Admitting: *Deleted

## 2010-08-24 DIAGNOSIS — I359 Nonrheumatic aortic valve disorder, unspecified: Secondary | ICD-10-CM

## 2010-08-24 DIAGNOSIS — Z8672 Personal history of thrombophlebitis: Secondary | ICD-10-CM

## 2010-08-24 DIAGNOSIS — I6350 Cerebral infarction due to unspecified occlusion or stenosis of unspecified cerebral artery: Secondary | ICD-10-CM

## 2010-08-24 DIAGNOSIS — I82409 Acute embolism and thrombosis of unspecified deep veins of unspecified lower extremity: Secondary | ICD-10-CM

## 2010-08-24 DIAGNOSIS — Z954 Presence of other heart-valve replacement: Secondary | ICD-10-CM

## 2010-08-24 LAB — POCT INR: INR: 3.3

## 2010-09-08 IMAGING — CT CT HEAD W/O CM
1 series · 16 of 30 positions shown, 20 images · non-contrast
Comparison: None.

CLINICAL DATA: Left sided facial droop.  Difficulty forming words.
Slurred speech.

CT HEAD WITHOUT CONTRAST
TECHNIQUE: Contiguous axial images were obtained from the base of
the skull through the vertex without contrast.

[Series 2: head routine 4.8 h37s · axial · 0.43mm/px · z∈[-115,+20]mm · 16 of 30 slices shown, 20 images]
[im 2/30  brain]
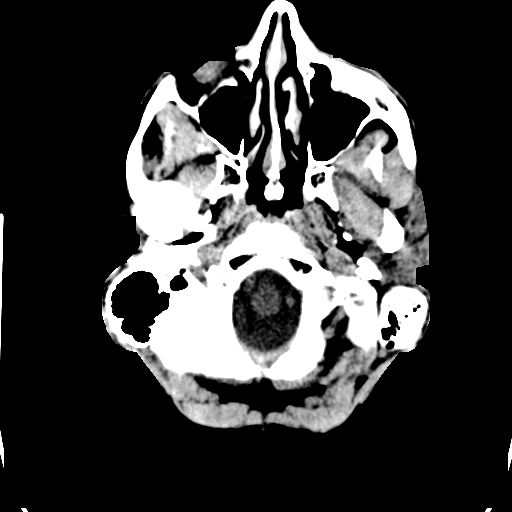
[im 2/30  bone]
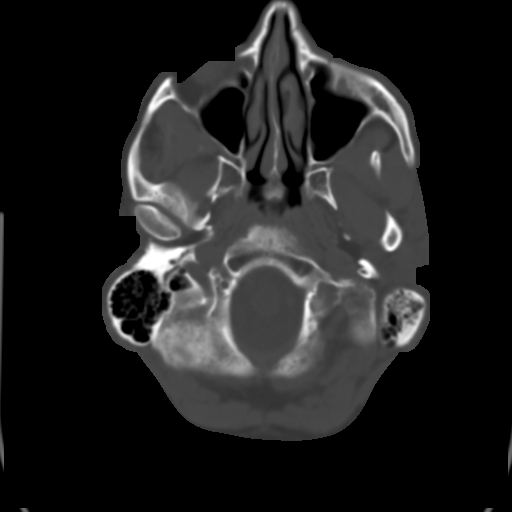
[im 4/30  brain]
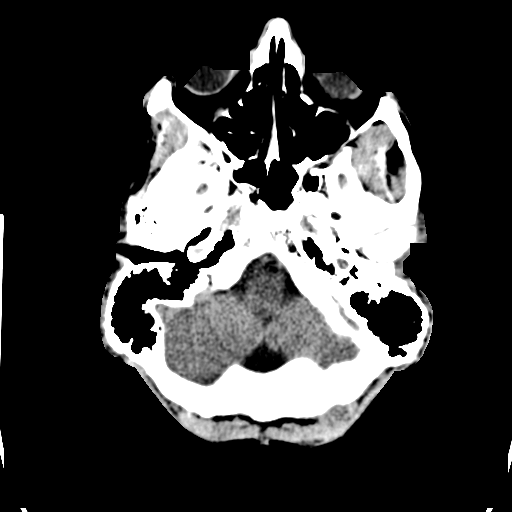
[im 6/30  brain]
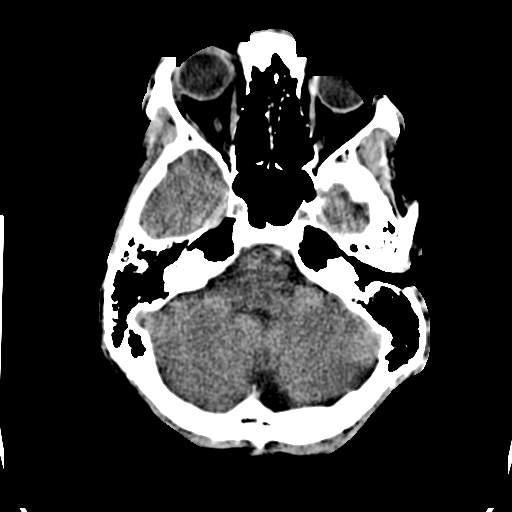
[im 8/30  brain]
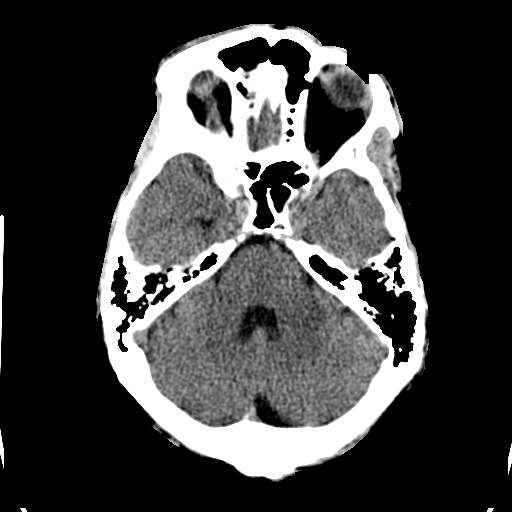
[im 9/30  brain]
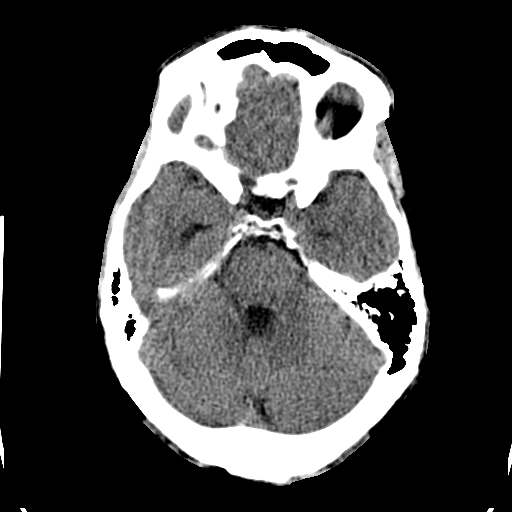
[im 9/30  bone]
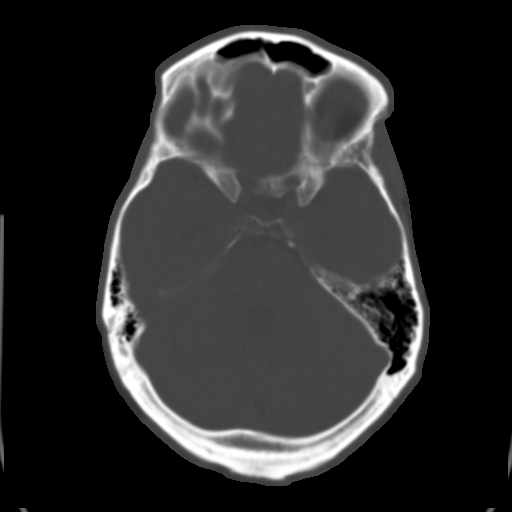
[im 11/30  brain]
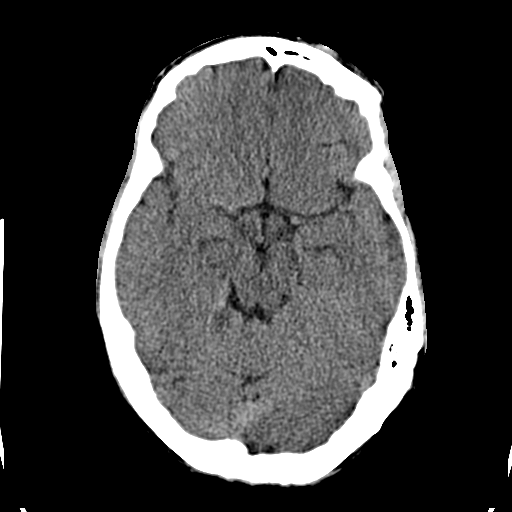
[im 13/30  brain]
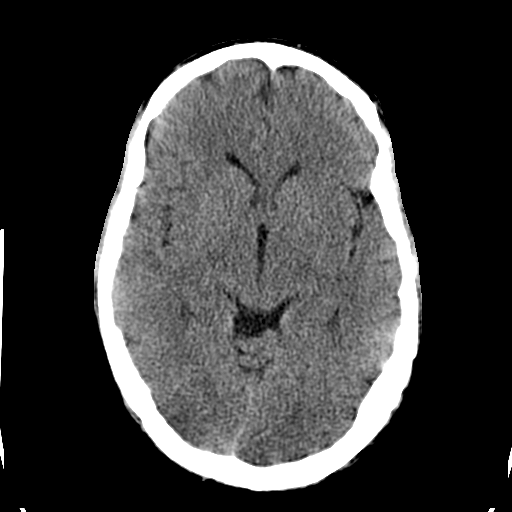
[im 15/30  brain]
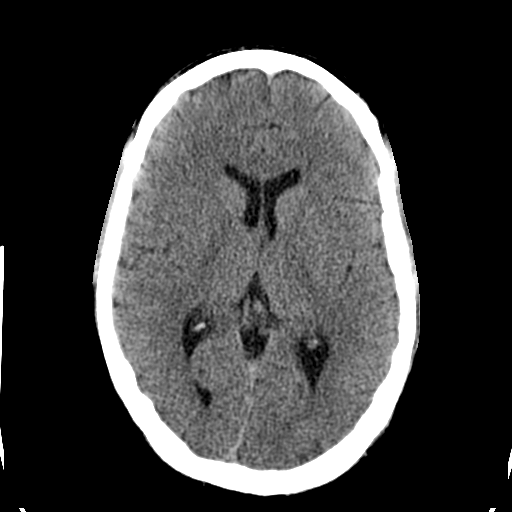
[im 16/30  brain]
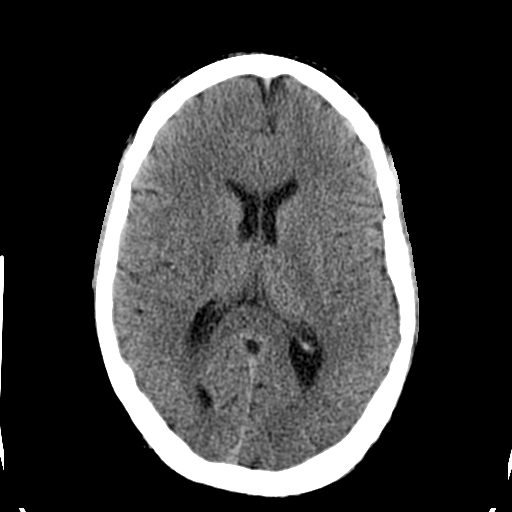
[im 16/30  bone]
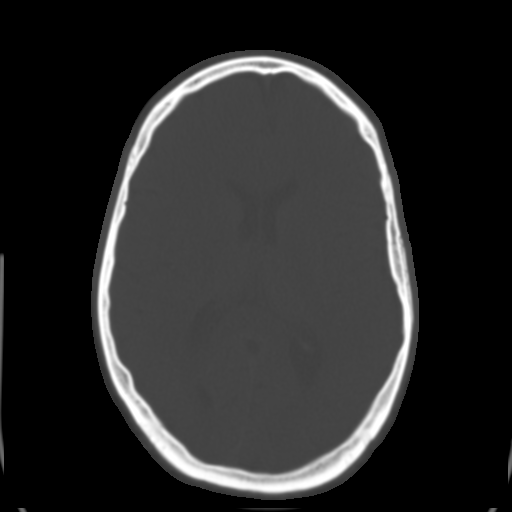
[im 18/30  brain]
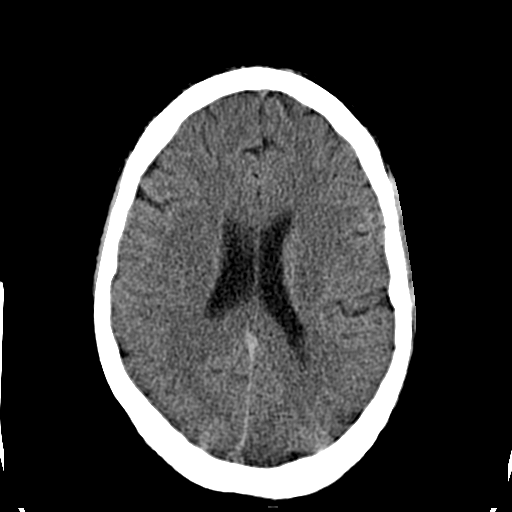
[im 20/30  brain]
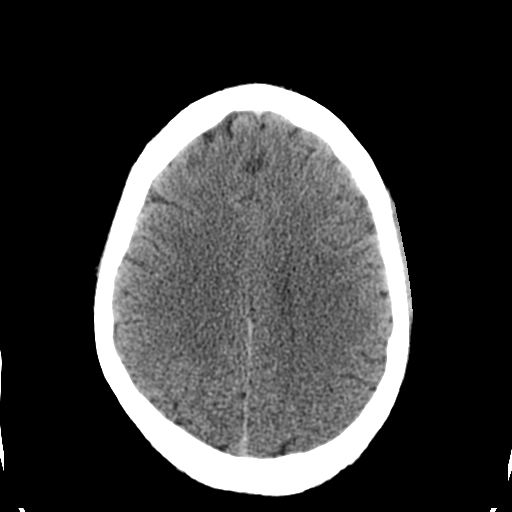
[im 22/30  brain]
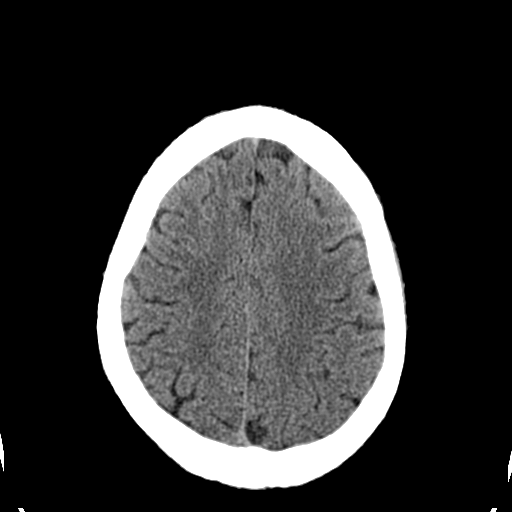
[im 23/30  brain]
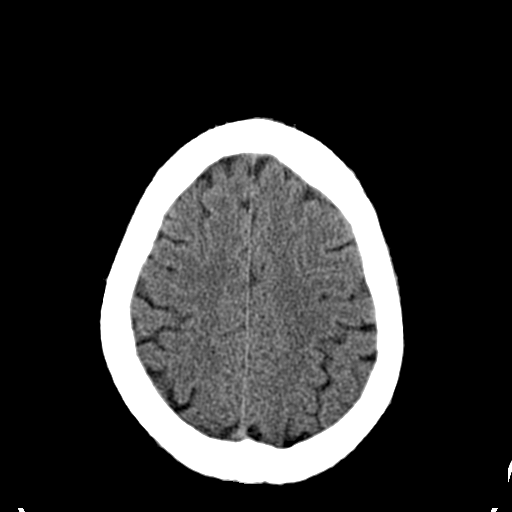
[im 23/30  bone]
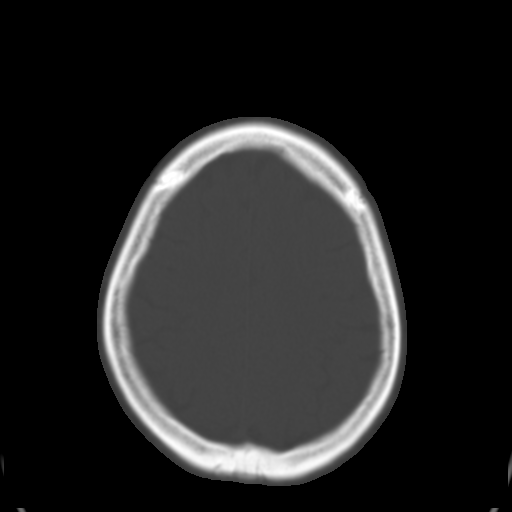
[im 25/30  brain]
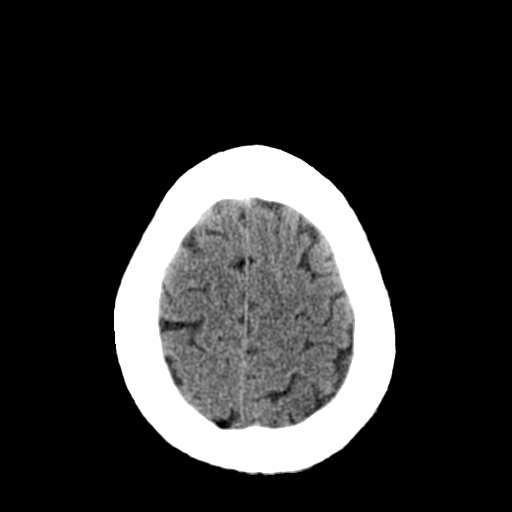
[im 27/30  brain]
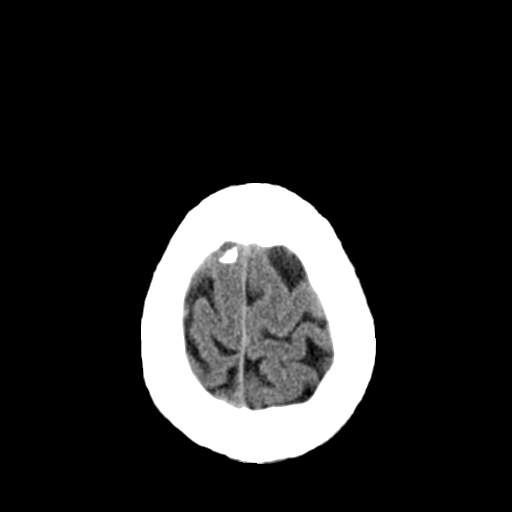
[im 29/30  brain]
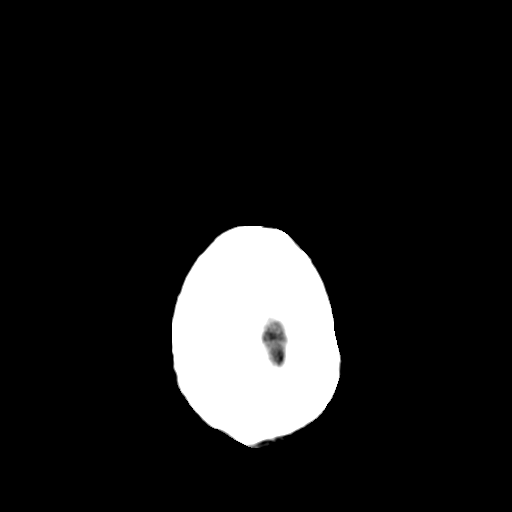

[16 of 30 positions shown; findings below may reference images not displayed]

FINDINGS: There is no evidence for acute hemorrhage, hydrocephalus,
mass lesion, or abnormal extra-axial fluid collection.  No definite
CT evidence for acute infarction.

The visualized paranasal sinuses and mastoid air cells are clear.
IMPRESSION: No acute intracranial abnormality.

## 2010-09-21 ENCOUNTER — Ambulatory Visit (INDEPENDENT_AMBULATORY_CARE_PROVIDER_SITE_OTHER): Payer: BC Managed Care – PPO | Admitting: *Deleted

## 2010-09-21 ENCOUNTER — Encounter: Payer: BC Managed Care – PPO | Admitting: *Deleted

## 2010-09-21 DIAGNOSIS — I6350 Cerebral infarction due to unspecified occlusion or stenosis of unspecified cerebral artery: Secondary | ICD-10-CM

## 2010-09-21 DIAGNOSIS — Z954 Presence of other heart-valve replacement: Secondary | ICD-10-CM

## 2010-09-21 DIAGNOSIS — I82409 Acute embolism and thrombosis of unspecified deep veins of unspecified lower extremity: Secondary | ICD-10-CM

## 2010-09-21 DIAGNOSIS — I359 Nonrheumatic aortic valve disorder, unspecified: Secondary | ICD-10-CM

## 2010-09-21 DIAGNOSIS — Z8672 Personal history of thrombophlebitis: Secondary | ICD-10-CM

## 2010-09-21 LAB — POCT INR: INR: 2.9

## 2010-10-19 ENCOUNTER — Encounter: Payer: BC Managed Care – PPO | Admitting: *Deleted

## 2010-10-20 ENCOUNTER — Ambulatory Visit (INDEPENDENT_AMBULATORY_CARE_PROVIDER_SITE_OTHER): Payer: BC Managed Care – PPO | Admitting: *Deleted

## 2010-10-20 DIAGNOSIS — Z8672 Personal history of thrombophlebitis: Secondary | ICD-10-CM

## 2010-10-20 DIAGNOSIS — I82409 Acute embolism and thrombosis of unspecified deep veins of unspecified lower extremity: Secondary | ICD-10-CM

## 2010-10-20 DIAGNOSIS — I359 Nonrheumatic aortic valve disorder, unspecified: Secondary | ICD-10-CM

## 2010-10-20 DIAGNOSIS — Z954 Presence of other heart-valve replacement: Secondary | ICD-10-CM

## 2010-10-20 DIAGNOSIS — I6350 Cerebral infarction due to unspecified occlusion or stenosis of unspecified cerebral artery: Secondary | ICD-10-CM

## 2010-10-20 LAB — POCT INR: INR: 3

## 2010-11-17 ENCOUNTER — Ambulatory Visit (INDEPENDENT_AMBULATORY_CARE_PROVIDER_SITE_OTHER): Payer: BC Managed Care – PPO | Admitting: *Deleted

## 2010-11-17 DIAGNOSIS — Z954 Presence of other heart-valve replacement: Secondary | ICD-10-CM

## 2010-11-17 DIAGNOSIS — I6350 Cerebral infarction due to unspecified occlusion or stenosis of unspecified cerebral artery: Secondary | ICD-10-CM

## 2010-11-17 DIAGNOSIS — I359 Nonrheumatic aortic valve disorder, unspecified: Secondary | ICD-10-CM

## 2010-11-17 DIAGNOSIS — I82409 Acute embolism and thrombosis of unspecified deep veins of unspecified lower extremity: Secondary | ICD-10-CM

## 2010-11-17 DIAGNOSIS — Z8672 Personal history of thrombophlebitis: Secondary | ICD-10-CM

## 2010-11-22 ENCOUNTER — Encounter: Payer: Self-pay | Admitting: Family Medicine

## 2010-12-01 ENCOUNTER — Ambulatory Visit (INDEPENDENT_AMBULATORY_CARE_PROVIDER_SITE_OTHER): Payer: BC Managed Care – PPO | Admitting: *Deleted

## 2010-12-01 DIAGNOSIS — Z8672 Personal history of thrombophlebitis: Secondary | ICD-10-CM

## 2010-12-01 DIAGNOSIS — Z954 Presence of other heart-valve replacement: Secondary | ICD-10-CM

## 2010-12-01 DIAGNOSIS — I359 Nonrheumatic aortic valve disorder, unspecified: Secondary | ICD-10-CM

## 2010-12-01 DIAGNOSIS — I82409 Acute embolism and thrombosis of unspecified deep veins of unspecified lower extremity: Secondary | ICD-10-CM

## 2010-12-01 DIAGNOSIS — I6350 Cerebral infarction due to unspecified occlusion or stenosis of unspecified cerebral artery: Secondary | ICD-10-CM

## 2010-12-01 LAB — POCT INR: INR: 1.8

## 2010-12-15 ENCOUNTER — Encounter: Payer: BC Managed Care – PPO | Admitting: *Deleted

## 2010-12-20 ENCOUNTER — Other Ambulatory Visit: Payer: Self-pay | Admitting: Cardiology

## 2011-01-16 ENCOUNTER — Other Ambulatory Visit: Payer: Self-pay | Admitting: Cardiology

## 2011-01-18 ENCOUNTER — Ambulatory Visit (INDEPENDENT_AMBULATORY_CARE_PROVIDER_SITE_OTHER): Payer: BC Managed Care – PPO | Admitting: *Deleted

## 2011-01-18 DIAGNOSIS — I6350 Cerebral infarction due to unspecified occlusion or stenosis of unspecified cerebral artery: Secondary | ICD-10-CM

## 2011-01-18 DIAGNOSIS — Z8672 Personal history of thrombophlebitis: Secondary | ICD-10-CM

## 2011-01-18 DIAGNOSIS — Z954 Presence of other heart-valve replacement: Secondary | ICD-10-CM

## 2011-01-18 DIAGNOSIS — I359 Nonrheumatic aortic valve disorder, unspecified: Secondary | ICD-10-CM

## 2011-01-18 DIAGNOSIS — I82409 Acute embolism and thrombosis of unspecified deep veins of unspecified lower extremity: Secondary | ICD-10-CM

## 2011-01-18 DIAGNOSIS — Z7901 Long term (current) use of anticoagulants: Secondary | ICD-10-CM

## 2011-01-18 LAB — POCT INR: INR: 3.2

## 2011-02-22 ENCOUNTER — Ambulatory Visit (INDEPENDENT_AMBULATORY_CARE_PROVIDER_SITE_OTHER): Payer: BC Managed Care – PPO | Admitting: *Deleted

## 2011-02-22 DIAGNOSIS — I6350 Cerebral infarction due to unspecified occlusion or stenosis of unspecified cerebral artery: Secondary | ICD-10-CM

## 2011-02-22 DIAGNOSIS — I359 Nonrheumatic aortic valve disorder, unspecified: Secondary | ICD-10-CM

## 2011-02-22 DIAGNOSIS — Z954 Presence of other heart-valve replacement: Secondary | ICD-10-CM

## 2011-02-22 DIAGNOSIS — Z8672 Personal history of thrombophlebitis: Secondary | ICD-10-CM

## 2011-02-22 DIAGNOSIS — I82409 Acute embolism and thrombosis of unspecified deep veins of unspecified lower extremity: Secondary | ICD-10-CM

## 2011-02-22 DIAGNOSIS — Z7901 Long term (current) use of anticoagulants: Secondary | ICD-10-CM

## 2011-03-15 ENCOUNTER — Ambulatory Visit (INDEPENDENT_AMBULATORY_CARE_PROVIDER_SITE_OTHER): Payer: BC Managed Care – PPO | Admitting: *Deleted

## 2011-03-15 DIAGNOSIS — Z7901 Long term (current) use of anticoagulants: Secondary | ICD-10-CM

## 2011-03-15 DIAGNOSIS — I82409 Acute embolism and thrombosis of unspecified deep veins of unspecified lower extremity: Secondary | ICD-10-CM

## 2011-03-15 DIAGNOSIS — Z954 Presence of other heart-valve replacement: Secondary | ICD-10-CM

## 2011-03-15 DIAGNOSIS — Z8672 Personal history of thrombophlebitis: Secondary | ICD-10-CM

## 2011-03-15 DIAGNOSIS — I359 Nonrheumatic aortic valve disorder, unspecified: Secondary | ICD-10-CM

## 2011-03-15 DIAGNOSIS — I6350 Cerebral infarction due to unspecified occlusion or stenosis of unspecified cerebral artery: Secondary | ICD-10-CM

## 2011-04-11 ENCOUNTER — Other Ambulatory Visit: Payer: Self-pay | Admitting: Cardiology

## 2011-04-12 ENCOUNTER — Ambulatory Visit (INDEPENDENT_AMBULATORY_CARE_PROVIDER_SITE_OTHER): Payer: BC Managed Care – PPO | Admitting: *Deleted

## 2011-04-12 DIAGNOSIS — Z7901 Long term (current) use of anticoagulants: Secondary | ICD-10-CM

## 2011-04-12 DIAGNOSIS — Z8672 Personal history of thrombophlebitis: Secondary | ICD-10-CM

## 2011-04-12 DIAGNOSIS — I6350 Cerebral infarction due to unspecified occlusion or stenosis of unspecified cerebral artery: Secondary | ICD-10-CM

## 2011-04-12 DIAGNOSIS — Z954 Presence of other heart-valve replacement: Secondary | ICD-10-CM

## 2011-04-12 DIAGNOSIS — I82409 Acute embolism and thrombosis of unspecified deep veins of unspecified lower extremity: Secondary | ICD-10-CM

## 2011-04-12 DIAGNOSIS — I359 Nonrheumatic aortic valve disorder, unspecified: Secondary | ICD-10-CM

## 2011-04-12 LAB — POCT INR: INR: 2.2

## 2011-05-10 ENCOUNTER — Ambulatory Visit (INDEPENDENT_AMBULATORY_CARE_PROVIDER_SITE_OTHER): Payer: BC Managed Care – PPO | Admitting: *Deleted

## 2011-05-10 DIAGNOSIS — I359 Nonrheumatic aortic valve disorder, unspecified: Secondary | ICD-10-CM

## 2011-05-10 DIAGNOSIS — I82409 Acute embolism and thrombosis of unspecified deep veins of unspecified lower extremity: Secondary | ICD-10-CM

## 2011-05-10 DIAGNOSIS — Z954 Presence of other heart-valve replacement: Secondary | ICD-10-CM

## 2011-05-10 DIAGNOSIS — Z7901 Long term (current) use of anticoagulants: Secondary | ICD-10-CM

## 2011-05-10 DIAGNOSIS — I6350 Cerebral infarction due to unspecified occlusion or stenosis of unspecified cerebral artery: Secondary | ICD-10-CM

## 2011-05-10 DIAGNOSIS — Z8672 Personal history of thrombophlebitis: Secondary | ICD-10-CM

## 2011-05-10 LAB — POCT INR: INR: 3.2

## 2011-06-07 ENCOUNTER — Ambulatory Visit (INDEPENDENT_AMBULATORY_CARE_PROVIDER_SITE_OTHER): Payer: BC Managed Care – PPO | Admitting: *Deleted

## 2011-06-07 DIAGNOSIS — I82409 Acute embolism and thrombosis of unspecified deep veins of unspecified lower extremity: Secondary | ICD-10-CM

## 2011-06-07 DIAGNOSIS — I359 Nonrheumatic aortic valve disorder, unspecified: Secondary | ICD-10-CM

## 2011-06-07 DIAGNOSIS — Z954 Presence of other heart-valve replacement: Secondary | ICD-10-CM

## 2011-06-07 DIAGNOSIS — Z7901 Long term (current) use of anticoagulants: Secondary | ICD-10-CM

## 2011-06-07 DIAGNOSIS — Z8672 Personal history of thrombophlebitis: Secondary | ICD-10-CM

## 2011-06-07 DIAGNOSIS — I6350 Cerebral infarction due to unspecified occlusion or stenosis of unspecified cerebral artery: Secondary | ICD-10-CM

## 2011-06-07 LAB — POCT INR: INR: 3.4

## 2011-07-05 ENCOUNTER — Ambulatory Visit (INDEPENDENT_AMBULATORY_CARE_PROVIDER_SITE_OTHER): Payer: BC Managed Care – PPO

## 2011-07-05 DIAGNOSIS — I359 Nonrheumatic aortic valve disorder, unspecified: Secondary | ICD-10-CM

## 2011-07-05 DIAGNOSIS — I82409 Acute embolism and thrombosis of unspecified deep veins of unspecified lower extremity: Secondary | ICD-10-CM

## 2011-07-05 DIAGNOSIS — Z954 Presence of other heart-valve replacement: Secondary | ICD-10-CM

## 2011-07-05 DIAGNOSIS — I6350 Cerebral infarction due to unspecified occlusion or stenosis of unspecified cerebral artery: Secondary | ICD-10-CM

## 2011-07-05 DIAGNOSIS — Z8672 Personal history of thrombophlebitis: Secondary | ICD-10-CM

## 2011-07-05 DIAGNOSIS — Z7901 Long term (current) use of anticoagulants: Secondary | ICD-10-CM

## 2011-08-09 ENCOUNTER — Ambulatory Visit (INDEPENDENT_AMBULATORY_CARE_PROVIDER_SITE_OTHER): Payer: BC Managed Care – PPO

## 2011-08-09 DIAGNOSIS — I6350 Cerebral infarction due to unspecified occlusion or stenosis of unspecified cerebral artery: Secondary | ICD-10-CM

## 2011-08-09 DIAGNOSIS — I82409 Acute embolism and thrombosis of unspecified deep veins of unspecified lower extremity: Secondary | ICD-10-CM

## 2011-08-09 DIAGNOSIS — I359 Nonrheumatic aortic valve disorder, unspecified: Secondary | ICD-10-CM

## 2011-08-09 DIAGNOSIS — Z7901 Long term (current) use of anticoagulants: Secondary | ICD-10-CM

## 2011-08-09 DIAGNOSIS — Z8672 Personal history of thrombophlebitis: Secondary | ICD-10-CM

## 2011-08-09 DIAGNOSIS — Z954 Presence of other heart-valve replacement: Secondary | ICD-10-CM

## 2011-08-09 LAB — POCT INR: INR: 5.4

## 2011-08-16 ENCOUNTER — Ambulatory Visit (INDEPENDENT_AMBULATORY_CARE_PROVIDER_SITE_OTHER): Payer: BC Managed Care – PPO

## 2011-08-16 DIAGNOSIS — Z7901 Long term (current) use of anticoagulants: Secondary | ICD-10-CM

## 2011-08-16 DIAGNOSIS — Z954 Presence of other heart-valve replacement: Secondary | ICD-10-CM

## 2011-08-16 DIAGNOSIS — I359 Nonrheumatic aortic valve disorder, unspecified: Secondary | ICD-10-CM

## 2011-08-16 DIAGNOSIS — Z8672 Personal history of thrombophlebitis: Secondary | ICD-10-CM

## 2011-08-16 DIAGNOSIS — I6350 Cerebral infarction due to unspecified occlusion or stenosis of unspecified cerebral artery: Secondary | ICD-10-CM

## 2011-08-16 DIAGNOSIS — I82409 Acute embolism and thrombosis of unspecified deep veins of unspecified lower extremity: Secondary | ICD-10-CM

## 2011-08-16 LAB — POCT INR: INR: 3.9

## 2011-08-27 ENCOUNTER — Other Ambulatory Visit: Payer: Self-pay | Admitting: Cardiology

## 2011-08-31 ENCOUNTER — Ambulatory Visit (INDEPENDENT_AMBULATORY_CARE_PROVIDER_SITE_OTHER): Payer: BC Managed Care – PPO | Admitting: *Deleted

## 2011-08-31 DIAGNOSIS — I359 Nonrheumatic aortic valve disorder, unspecified: Secondary | ICD-10-CM

## 2011-08-31 DIAGNOSIS — Z8672 Personal history of thrombophlebitis: Secondary | ICD-10-CM

## 2011-08-31 DIAGNOSIS — I82409 Acute embolism and thrombosis of unspecified deep veins of unspecified lower extremity: Secondary | ICD-10-CM

## 2011-08-31 DIAGNOSIS — Z7901 Long term (current) use of anticoagulants: Secondary | ICD-10-CM

## 2011-08-31 DIAGNOSIS — Z954 Presence of other heart-valve replacement: Secondary | ICD-10-CM

## 2011-08-31 DIAGNOSIS — I6350 Cerebral infarction due to unspecified occlusion or stenosis of unspecified cerebral artery: Secondary | ICD-10-CM

## 2011-09-19 ENCOUNTER — Ambulatory Visit (INDEPENDENT_AMBULATORY_CARE_PROVIDER_SITE_OTHER): Payer: BC Managed Care – PPO | Admitting: *Deleted

## 2011-09-19 DIAGNOSIS — Z7901 Long term (current) use of anticoagulants: Secondary | ICD-10-CM

## 2011-09-19 DIAGNOSIS — I82409 Acute embolism and thrombosis of unspecified deep veins of unspecified lower extremity: Secondary | ICD-10-CM

## 2011-09-19 DIAGNOSIS — I6350 Cerebral infarction due to unspecified occlusion or stenosis of unspecified cerebral artery: Secondary | ICD-10-CM

## 2011-09-19 DIAGNOSIS — Z954 Presence of other heart-valve replacement: Secondary | ICD-10-CM

## 2011-09-19 DIAGNOSIS — I359 Nonrheumatic aortic valve disorder, unspecified: Secondary | ICD-10-CM

## 2011-09-19 DIAGNOSIS — Z8672 Personal history of thrombophlebitis: Secondary | ICD-10-CM

## 2011-11-16 ENCOUNTER — Ambulatory Visit (INDEPENDENT_AMBULATORY_CARE_PROVIDER_SITE_OTHER): Payer: BC Managed Care – PPO | Admitting: Pharmacist

## 2011-11-16 DIAGNOSIS — Z7901 Long term (current) use of anticoagulants: Secondary | ICD-10-CM

## 2011-11-16 DIAGNOSIS — Z954 Presence of other heart-valve replacement: Secondary | ICD-10-CM

## 2011-11-16 DIAGNOSIS — Z8672 Personal history of thrombophlebitis: Secondary | ICD-10-CM

## 2011-11-16 DIAGNOSIS — I359 Nonrheumatic aortic valve disorder, unspecified: Secondary | ICD-10-CM

## 2011-11-16 DIAGNOSIS — I82409 Acute embolism and thrombosis of unspecified deep veins of unspecified lower extremity: Secondary | ICD-10-CM

## 2011-11-16 DIAGNOSIS — I6350 Cerebral infarction due to unspecified occlusion or stenosis of unspecified cerebral artery: Secondary | ICD-10-CM

## 2011-11-30 ENCOUNTER — Ambulatory Visit (INDEPENDENT_AMBULATORY_CARE_PROVIDER_SITE_OTHER): Payer: BC Managed Care – PPO

## 2011-11-30 DIAGNOSIS — Z954 Presence of other heart-valve replacement: Secondary | ICD-10-CM

## 2011-11-30 DIAGNOSIS — Z8672 Personal history of thrombophlebitis: Secondary | ICD-10-CM

## 2011-11-30 DIAGNOSIS — I82409 Acute embolism and thrombosis of unspecified deep veins of unspecified lower extremity: Secondary | ICD-10-CM

## 2011-11-30 DIAGNOSIS — I6350 Cerebral infarction due to unspecified occlusion or stenosis of unspecified cerebral artery: Secondary | ICD-10-CM

## 2011-11-30 DIAGNOSIS — I359 Nonrheumatic aortic valve disorder, unspecified: Secondary | ICD-10-CM

## 2011-11-30 DIAGNOSIS — Z7901 Long term (current) use of anticoagulants: Secondary | ICD-10-CM

## 2011-12-05 ENCOUNTER — Telehealth: Payer: Self-pay | Admitting: Cardiology

## 2011-12-05 NOTE — Telephone Encounter (Signed)
Wife calls for appointment with Dr. Daleen Squibb for pt and herself. Appointment made January 01, 2012 Mylo Red RN

## 2011-12-05 NOTE — Telephone Encounter (Signed)
Pt's wife calling to see when he is due for fu appt? Last visit couldn't find in epic, checked old system and found 08-11-2009

## 2011-12-19 ENCOUNTER — Ambulatory Visit (INDEPENDENT_AMBULATORY_CARE_PROVIDER_SITE_OTHER): Payer: BC Managed Care – PPO | Admitting: *Deleted

## 2011-12-19 DIAGNOSIS — I359 Nonrheumatic aortic valve disorder, unspecified: Secondary | ICD-10-CM

## 2011-12-19 DIAGNOSIS — Z7901 Long term (current) use of anticoagulants: Secondary | ICD-10-CM

## 2011-12-19 DIAGNOSIS — I6350 Cerebral infarction due to unspecified occlusion or stenosis of unspecified cerebral artery: Secondary | ICD-10-CM

## 2011-12-19 DIAGNOSIS — I82409 Acute embolism and thrombosis of unspecified deep veins of unspecified lower extremity: Secondary | ICD-10-CM

## 2011-12-19 DIAGNOSIS — Z8672 Personal history of thrombophlebitis: Secondary | ICD-10-CM

## 2011-12-19 DIAGNOSIS — Z954 Presence of other heart-valve replacement: Secondary | ICD-10-CM

## 2011-12-31 ENCOUNTER — Encounter: Payer: Self-pay | Admitting: Cardiology

## 2012-01-01 ENCOUNTER — Ambulatory Visit (INDEPENDENT_AMBULATORY_CARE_PROVIDER_SITE_OTHER): Payer: BC Managed Care – PPO | Admitting: Cardiology

## 2012-01-01 ENCOUNTER — Encounter: Payer: Self-pay | Admitting: Cardiology

## 2012-01-01 VITALS — BP 112/72 | HR 63 | Ht 71.5 in | Wt 185.8 lb

## 2012-01-01 DIAGNOSIS — I719 Aortic aneurysm of unspecified site, without rupture: Secondary | ICD-10-CM

## 2012-01-01 DIAGNOSIS — Q231 Congenital insufficiency of aortic valve: Secondary | ICD-10-CM

## 2012-01-01 DIAGNOSIS — Z954 Presence of other heart-valve replacement: Secondary | ICD-10-CM

## 2012-01-01 DIAGNOSIS — I359 Nonrheumatic aortic valve disorder, unspecified: Secondary | ICD-10-CM

## 2012-01-01 DIAGNOSIS — Z7901 Long term (current) use of anticoagulants: Secondary | ICD-10-CM

## 2012-01-01 NOTE — Progress Notes (Signed)
HPI Robert [name] comes in today for followup of his complex cardiac and valvular history.  He is doing remarkably well. 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 His resting heart rate today is down to 60s. He stays extremely active. He denies any chest pain, shortness of breath, palpitations, presyncope or syncope, edema. He's very compliant with his Coumadin.    Past Medical History  Diagnosis Date  . DVT (deep venous thrombosis)   . S/P aortic valve replacement   . Subacute bacterial endocarditis (SBE)   . Migraine headache   . Renal stone   . CVA (cerebral infarction)   . Aneurysm of ascending aorta     Current Outpatient Prescriptions  Medication Sig Dispense Refill  . gabapentin (NEURONTIN) 800 MG tablet Take 800 mg by mouth 2 (two) times daily.      Marland Kitchen warfarin (COUMADIN) 5 MG tablet TAKE AS DIRECTED  45 tablet  3    No Known Allergies  No family history on file.  History   Social History  . Marital Status: Married    Spouse Name: N/A    Number of Children: N/A  . Years of Education: N/A   Occupational History  . Not on file.   Social History Main Topics  . Smoking status: Never Smoker   . Smokeless tobacco: Never Used  . Alcohol Use: 1.0 oz/week    2 drink(s) per week  . Drug Use: No  . Sexually Active: Not on file   Other Topics Concern  . Not on file   Social History Narrative  . No narrative on file    ROS ALL NEGATIVE EXCEPT THOSE NOTED IN HPI  PE  General Appearance: well developed, well nourished in no acute distress HEENT: symmetrical face, PERRLA, good dentition  Neck: no JVD, thyromegaly, or adenopathy, trachea midline Chest: symmetric without deformity Cardiac: PMI non-displaced, RRR, normal S1, metallic S2 was split, no gallop, soft systolic murmur, no diastolic component Lung: clear to ausculation and percussion Vascular: all pulses full without bruits  Abdominal: nondistended, nontender, good bowel sounds, no HSM, no  bruits Extremities: no cyanosis, clubbing or edema, no sign of DVT, no varicosities  Skin: normal color, no rashes Neuro: alert and oriented x 3, non-focal Pysch: normal affect  EKG Not repeated today.  BMET    Component Value Date/Time   NA 142 06/02/2010 0018   K 3.9 06/02/2010 0018   CL 107 06/02/2010 0018   CO2 30 08/13/2008 0000   GLUCOSE 98 06/02/2010 0018   BUN 23 06/02/2010 0018   CREATININE 1.1 06/02/2010 0018   CALCIUM 9.2 08/13/2008 0000   GFRNONAA 96.10 08/13/2008 0000   GFRAA  Value: >60        The eGFR has been calculated using the MDRD equation. This calculation has not been validated in all clinical situations. eGFR's persistently <60 mL/min signify possible Chronic Kidney Disease. 07/10/2008 1250    Lipid Panel     Component Value Date/Time   CHOL  Value: 142        ATP III CLASSIFICATION:  <200     mg/dL   Desirable  161-096  mg/dL   Borderline High  >=045    mg/dL   High        07/02/8117 0500   TRIG 50 06/08/2008 0500   HDL 27* 06/08/2008 0500   CHOLHDL 5.3 06/08/2008 0500   VLDL 10 06/08/2008 0500   LDLCALC  Value: 105  Total Cholesterol/HDL:CHD Risk Coronary Heart Disease Risk Table                     Men   Women  1/2 Average Risk   3.4   3.3  Average Risk       5.0   4.4  2 X Average Risk   9.6   7.1  3 X Average Risk  23.4   11.0        Use the calculated Patient Ratio above and the CHD Risk Table to determine the patient's CHD Risk.        ATP III CLASSIFICATION (LDL):  <100     mg/dL   Optimal  161-096  mg/dL   Near or Above                    Optimal  130-159  mg/dL   Borderline  045-409  mg/dL   High  >811     mg/dL   Very High* 12/08/7827 0500    CBC    Component Value Date/Time   WBC 5.5 06/02/2010 0005   RBC 4.95 06/02/2010 0005   HGB 14.6 06/02/2010 0018   HCT 43.0 06/02/2010 0018   PLT 254 06/02/2010 0005   MCV 86.1 06/02/2010 0005   MCH 28.9 06/02/2010 0005   MCHC 33.6 06/02/2010 0005   RDW 12.6 06/02/2010 0005   LYMPHSABS 2.8 06/02/2010 0005   MONOABS 0.5 06/02/2010 0005    EOSABS 0.1 06/02/2010 0005   BASOSABS 0.0 06/02/2010 0005

## 2012-01-01 NOTE — Assessment & Plan Note (Signed)
Doing remarkably well on anticoagulation only. I've asked him to slow down with some of his activities particularly climbing trees, using chainsaws, particularly on Coumadin. Wife reinforced. Otherwise I will see him back in a year.

## 2012-01-01 NOTE — Patient Instructions (Addendum)
Your physician recommends that you continue on your current medications as directed. Please refer to the Current Medication list given to you today.  Your physician wants you to follow-up in: 1 year. You will receive a reminder letter in the mail two months in advance. If you don't receive a letter, please call our office to schedule the follow-up appointment.  

## 2012-01-16 ENCOUNTER — Ambulatory Visit (INDEPENDENT_AMBULATORY_CARE_PROVIDER_SITE_OTHER): Payer: BC Managed Care – PPO | Admitting: *Deleted

## 2012-01-16 DIAGNOSIS — Z7901 Long term (current) use of anticoagulants: Secondary | ICD-10-CM

## 2012-01-16 DIAGNOSIS — I6350 Cerebral infarction due to unspecified occlusion or stenosis of unspecified cerebral artery: Secondary | ICD-10-CM

## 2012-01-16 DIAGNOSIS — Z8672 Personal history of thrombophlebitis: Secondary | ICD-10-CM

## 2012-01-16 DIAGNOSIS — I359 Nonrheumatic aortic valve disorder, unspecified: Secondary | ICD-10-CM

## 2012-01-16 DIAGNOSIS — I82409 Acute embolism and thrombosis of unspecified deep veins of unspecified lower extremity: Secondary | ICD-10-CM

## 2012-01-16 DIAGNOSIS — Z954 Presence of other heart-valve replacement: Secondary | ICD-10-CM

## 2012-01-16 LAB — POCT INR: INR: 3.4

## 2012-02-04 ENCOUNTER — Other Ambulatory Visit: Payer: Self-pay | Admitting: *Deleted

## 2012-02-04 MED ORDER — WARFARIN SODIUM 5 MG PO TABS: ORAL_TABLET | ORAL | Status: DC

## 2012-02-13 ENCOUNTER — Ambulatory Visit (INDEPENDENT_AMBULATORY_CARE_PROVIDER_SITE_OTHER): Payer: BC Managed Care – PPO | Admitting: *Deleted

## 2012-02-13 DIAGNOSIS — Z8672 Personal history of thrombophlebitis: Secondary | ICD-10-CM

## 2012-02-13 DIAGNOSIS — I6350 Cerebral infarction due to unspecified occlusion or stenosis of unspecified cerebral artery: Secondary | ICD-10-CM

## 2012-02-13 DIAGNOSIS — I359 Nonrheumatic aortic valve disorder, unspecified: Secondary | ICD-10-CM

## 2012-02-13 DIAGNOSIS — I82409 Acute embolism and thrombosis of unspecified deep veins of unspecified lower extremity: Secondary | ICD-10-CM

## 2012-02-13 DIAGNOSIS — Z954 Presence of other heart-valve replacement: Secondary | ICD-10-CM

## 2012-02-13 DIAGNOSIS — Z7901 Long term (current) use of anticoagulants: Secondary | ICD-10-CM

## 2012-06-04 ENCOUNTER — Other Ambulatory Visit: Payer: Self-pay | Admitting: Cardiology

## 2012-06-06 ENCOUNTER — Ambulatory Visit (INDEPENDENT_AMBULATORY_CARE_PROVIDER_SITE_OTHER): Payer: BC Managed Care – PPO | Admitting: *Deleted

## 2012-06-06 DIAGNOSIS — I359 Nonrheumatic aortic valve disorder, unspecified: Secondary | ICD-10-CM

## 2012-06-06 DIAGNOSIS — Z7901 Long term (current) use of anticoagulants: Secondary | ICD-10-CM

## 2012-06-06 DIAGNOSIS — Z954 Presence of other heart-valve replacement: Secondary | ICD-10-CM

## 2012-06-06 DIAGNOSIS — I6350 Cerebral infarction due to unspecified occlusion or stenosis of unspecified cerebral artery: Secondary | ICD-10-CM

## 2012-06-06 MED ORDER — WARFARIN SODIUM 5 MG PO TABS: ORAL_TABLET | ORAL | Status: DC

## 2012-07-18 ENCOUNTER — Ambulatory Visit (INDEPENDENT_AMBULATORY_CARE_PROVIDER_SITE_OTHER): Payer: BC Managed Care – PPO

## 2012-07-18 DIAGNOSIS — I359 Nonrheumatic aortic valve disorder, unspecified: Secondary | ICD-10-CM

## 2012-07-18 DIAGNOSIS — Z954 Presence of other heart-valve replacement: Secondary | ICD-10-CM

## 2012-07-18 DIAGNOSIS — Z7901 Long term (current) use of anticoagulants: Secondary | ICD-10-CM

## 2012-07-18 DIAGNOSIS — I6350 Cerebral infarction due to unspecified occlusion or stenosis of unspecified cerebral artery: Secondary | ICD-10-CM

## 2012-07-18 LAB — POCT INR: INR: 3.1

## 2012-08-01 ENCOUNTER — Telehealth: Payer: Self-pay | Admitting: Neurology

## 2012-08-01 NOTE — Telephone Encounter (Signed)
Patient's wife called on 05/08 in afternoon. She was concerned because patient's left arm was "jingling", mostly his outer two fingers. I called her back today. She said that his symptoms have resolved and she felt he was just leaning wrong on his arm. I advised Toniann Fail that I would make a notation of this and recommended that she call back if the symptoms return. Toniann Fail stated that she would. Valerie A. Schwalbach BS RN

## 2012-08-21 ENCOUNTER — Ambulatory Visit (INDEPENDENT_AMBULATORY_CARE_PROVIDER_SITE_OTHER): Payer: BC Managed Care – PPO

## 2012-08-21 DIAGNOSIS — I359 Nonrheumatic aortic valve disorder, unspecified: Secondary | ICD-10-CM

## 2012-08-21 DIAGNOSIS — Z7901 Long term (current) use of anticoagulants: Secondary | ICD-10-CM

## 2012-08-21 DIAGNOSIS — Z954 Presence of other heart-valve replacement: Secondary | ICD-10-CM

## 2012-08-21 DIAGNOSIS — I6350 Cerebral infarction due to unspecified occlusion or stenosis of unspecified cerebral artery: Secondary | ICD-10-CM

## 2012-09-17 ENCOUNTER — Ambulatory Visit (INDEPENDENT_AMBULATORY_CARE_PROVIDER_SITE_OTHER): Payer: BC Managed Care – PPO | Admitting: *Deleted

## 2012-09-17 DIAGNOSIS — Z7901 Long term (current) use of anticoagulants: Secondary | ICD-10-CM

## 2012-09-17 DIAGNOSIS — I359 Nonrheumatic aortic valve disorder, unspecified: Secondary | ICD-10-CM

## 2012-09-17 DIAGNOSIS — I6350 Cerebral infarction due to unspecified occlusion or stenosis of unspecified cerebral artery: Secondary | ICD-10-CM

## 2012-09-17 DIAGNOSIS — Z954 Presence of other heart-valve replacement: Secondary | ICD-10-CM

## 2012-09-17 LAB — POCT INR: INR: 2.7

## 2012-10-05 ENCOUNTER — Other Ambulatory Visit: Payer: Self-pay | Admitting: Cardiology

## 2012-10-15 ENCOUNTER — Ambulatory Visit (INDEPENDENT_AMBULATORY_CARE_PROVIDER_SITE_OTHER): Payer: BC Managed Care – PPO | Admitting: *Deleted

## 2012-10-15 DIAGNOSIS — I359 Nonrheumatic aortic valve disorder, unspecified: Secondary | ICD-10-CM

## 2012-10-15 DIAGNOSIS — I6350 Cerebral infarction due to unspecified occlusion or stenosis of unspecified cerebral artery: Secondary | ICD-10-CM

## 2012-10-15 DIAGNOSIS — Z954 Presence of other heart-valve replacement: Secondary | ICD-10-CM

## 2012-10-15 DIAGNOSIS — Z7901 Long term (current) use of anticoagulants: Secondary | ICD-10-CM

## 2012-10-15 LAB — POCT INR: INR: 3.4

## 2012-11-04 ENCOUNTER — Other Ambulatory Visit: Payer: Self-pay | Admitting: Neurology

## 2012-12-02 ENCOUNTER — Other Ambulatory Visit: Payer: Self-pay | Admitting: Neurology

## 2013-01-03 ENCOUNTER — Other Ambulatory Visit: Payer: Self-pay | Admitting: Neurology

## 2013-01-18 ENCOUNTER — Other Ambulatory Visit: Payer: Self-pay | Admitting: Neurology

## 2013-02-02 ENCOUNTER — Ambulatory Visit (INDEPENDENT_AMBULATORY_CARE_PROVIDER_SITE_OTHER): Payer: BC Managed Care – PPO | Admitting: *Deleted

## 2013-02-02 DIAGNOSIS — Z954 Presence of other heart-valve replacement: Secondary | ICD-10-CM

## 2013-02-02 DIAGNOSIS — I6350 Cerebral infarction due to unspecified occlusion or stenosis of unspecified cerebral artery: Secondary | ICD-10-CM

## 2013-02-02 DIAGNOSIS — I359 Nonrheumatic aortic valve disorder, unspecified: Secondary | ICD-10-CM

## 2013-02-02 DIAGNOSIS — Z7901 Long term (current) use of anticoagulants: Secondary | ICD-10-CM

## 2013-02-09 ENCOUNTER — Other Ambulatory Visit: Payer: Self-pay | Admitting: Cardiology

## 2013-02-12 ENCOUNTER — Other Ambulatory Visit: Payer: Self-pay | Admitting: Neurology

## 2013-02-16 ENCOUNTER — Other Ambulatory Visit: Payer: Self-pay | Admitting: Neurology

## 2013-03-16 ENCOUNTER — Ambulatory Visit (INDEPENDENT_AMBULATORY_CARE_PROVIDER_SITE_OTHER): Payer: BC Managed Care – PPO

## 2013-03-16 DIAGNOSIS — I359 Nonrheumatic aortic valve disorder, unspecified: Secondary | ICD-10-CM

## 2013-03-16 DIAGNOSIS — Z954 Presence of other heart-valve replacement: Secondary | ICD-10-CM

## 2013-03-16 DIAGNOSIS — I6350 Cerebral infarction due to unspecified occlusion or stenosis of unspecified cerebral artery: Secondary | ICD-10-CM

## 2013-03-16 DIAGNOSIS — Z7901 Long term (current) use of anticoagulants: Secondary | ICD-10-CM

## 2013-03-16 LAB — POCT INR: INR: 3.6

## 2013-03-17 ENCOUNTER — Ambulatory Visit (INDEPENDENT_AMBULATORY_CARE_PROVIDER_SITE_OTHER): Payer: BC Managed Care – PPO | Admitting: Family Medicine

## 2013-03-17 ENCOUNTER — Encounter: Payer: Self-pay | Admitting: Family Medicine

## 2013-03-17 VITALS — BP 106/72 | HR 66 | Wt 187.0 lb

## 2013-03-17 DIAGNOSIS — IMO0002 Reserved for concepts with insufficient information to code with codable children: Secondary | ICD-10-CM

## 2013-03-17 DIAGNOSIS — Z23 Encounter for immunization: Secondary | ICD-10-CM

## 2013-03-17 DIAGNOSIS — S76011A Strain of muscle, fascia and tendon of right hip, initial encounter: Secondary | ICD-10-CM

## 2013-03-17 NOTE — Progress Notes (Signed)
   Subjective:    Patient ID: Paulo Fruit, male    DOB: [birth year], 52 y.o.   MRN: [id number]  HPI He complains of right inguinal discomfort for the last 2 weeks. No history of injury or overuse. He does have a previous history of as activity.   Review of Systems     Objective:   Physical Exam Alert and in no distress. No inguinal adenopathy noted. Slight tenderness over the insertion of the abductors on the right side. No hernia noted. Penis and testes normal.       Assessment & Plan:  Need for prophylactic vaccination and inoculation against influenza - Plan: Flu Vaccine QUAD 36+ mos IM  Strain of hip adductor muscle, right, initial encounter  flu shot given with risks and benefits discussed. His symptoms are most consistent with a strain although there is no good history of overuse or injury to that. Recommend supportive care.

## 2013-04-20 ENCOUNTER — Ambulatory Visit (INDEPENDENT_AMBULATORY_CARE_PROVIDER_SITE_OTHER): Payer: BC Managed Care – PPO | Admitting: *Deleted

## 2013-04-20 DIAGNOSIS — Z954 Presence of other heart-valve replacement: Secondary | ICD-10-CM

## 2013-04-20 DIAGNOSIS — Z5181 Encounter for therapeutic drug level monitoring: Secondary | ICD-10-CM

## 2013-04-20 DIAGNOSIS — Z7901 Long term (current) use of anticoagulants: Secondary | ICD-10-CM

## 2013-04-20 DIAGNOSIS — I359 Nonrheumatic aortic valve disorder, unspecified: Secondary | ICD-10-CM

## 2013-04-20 DIAGNOSIS — I6350 Cerebral infarction due to unspecified occlusion or stenosis of unspecified cerebral artery: Secondary | ICD-10-CM

## 2013-04-20 LAB — POCT INR: INR: 3.8

## 2013-04-28 ENCOUNTER — Encounter: Payer: Self-pay | Admitting: *Deleted

## 2013-04-29 ENCOUNTER — Ambulatory Visit (INDEPENDENT_AMBULATORY_CARE_PROVIDER_SITE_OTHER): Payer: BC Managed Care – PPO | Admitting: Nurse Practitioner

## 2013-04-29 ENCOUNTER — Encounter (INDEPENDENT_AMBULATORY_CARE_PROVIDER_SITE_OTHER): Payer: Self-pay

## 2013-04-29 ENCOUNTER — Encounter: Payer: Self-pay | Admitting: Nurse Practitioner

## 2013-04-29 VITALS — BP 108/62 | HR 66 | Ht 71.75 in | Wt 190.0 lb

## 2013-04-29 DIAGNOSIS — I6990 Unspecified sequelae of unspecified cerebrovascular disease: Secondary | ICD-10-CM | POA: Insufficient documentation

## 2013-04-29 DIAGNOSIS — R209 Unspecified disturbances of skin sensation: Secondary | ICD-10-CM

## 2013-04-29 MED ORDER — GABAPENTIN 800 MG PO TABS: 800.0000 mg | ORAL_TABLET | Freq: Two times a day (BID) | ORAL | Status: DC

## 2013-04-29 NOTE — Progress Notes (Signed)
[facility]  PATIENT: Robert [name] DOB: 1960-06-26   REASON FOR VISIT: Followup for left-sided paresthesias   HISTORY OF PRESENT ILLNESS: Mr. Venhuizen, 53 year old male returns for followup. 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 Gabapentin has controlled his paresthesias well. He has a prosthetic heart valve and is on Coumadin. He needs refills and returns for reevaluation.  HISTORY:   He reports a significant improvement in left-sided paresthesias after beginning the Gabapentin.   He denies  transient episode of dizziness which he has had in the past .  He denies any headache, slurred speech, gait or balance problems. He occasionally has brief sensations in his arms or legs but the Gabapentin has been very beneficial.  He is continuing to exercise regularly.  He was tried on Lyrica at one time but he was too costly.      REVIEW OF SYSTEMS: Full 14 system review of systems performed and notable only for those listed, all others are neg:  Constitutional: N/A  Cardiovascular: N/A  Ear/Nose/Throat: N/A  Skin: N/A  Eyes: N/A  Respiratory: N/A  Gastroitestinal: N/A  Hematology/Lymphatic: N/A  Endocrine: N/A Musculoskeletal:N/A  Allergy/Immunology: N/A  Neurological: Occasional headache  Psychiatric: N/A   ALLERGIES: No Known Allergies  HOME MEDICATIONS: Outpatient Prescriptions Prior to Visit  Medication Sig Dispense Refill  . gabapentin (NEURONTIN) 800 MG tablet TAKE 1 TABLET BY MOUTH TWICE A DAY  60 tablet  2  . warfarin (COUMADIN) 5 MG tablet TAKE AS DIRECTED BY COUMADIN CLINIC  45 tablet  3   No facility-administered medications prior to visit.    PAST MEDICAL HISTORY: Past Medical History  Diagnosis Date  . DVT (deep venous thrombosis)   . S/P aortic valve replacement   . Subacute bacterial endocarditis (SBE)   . Migraine headache   . Renal stone     . CVA (cerebral infarction)   . Aneurysm of ascending aorta     PAST SURGICAL HISTORY: Past Surgical History  Procedure Laterality Date  . Cardiac catheterization  01/2005  . Aortic valve replacement  2007    FAMILY HISTORY: History reviewed. No pertinent family history.  SOCIAL HISTORY: History   Social History  . Marital Status: Married    Spouse Name: [name]    Number of Children: 8  . Years of Education: BSEE   Occupational History  .  [employer]   Social History Main Topics  . Smoking status: Never Smoker   . Smokeless tobacco: Never Used  . Alcohol Use: 1.0 oz/week    2 drink(s) per week  . Drug Use: No  . Sexual Activity: Not on file   Other Topics Concern  . Not on file   Social History Narrative   Patient lives at home with his wife [name].    Patient has 8 children. Boys   Patient is an Art gallery manager.           PHYSICAL EXAM  Filed Vitals:   04/29/13 1341  BP: 108/62  Pulse: 66  Height: 5' 11.75" (1.822 m)  Weight: 190 lb (86.183 kg)   Body mass index is 25.96 kg/(m^2).  Generalized: Well developed, in no acute distress  Head: normocephalic and atraumatic,. Oropharynx benign  Neck: Supple, no carotid bruits  Cardiac: Mechanical heart sounds with regular rate and rhythm Musculoskeletal: No deformity   Neurological examination   Mentation:  Alert oriented to time, place, history taking. Follows all commands speech and language fluent  Cranial nerve II-XII: Fundoscopic exam reveals sharp disc margins.Pupils were equal round reactive to light extraocular movements were full, visual field were full on confrontational test. Facial sensation and strength were normal. hearing was intact to finger rubbing bilaterally. Uvula tongue midline. head turning and shoulder shrug were normal and symmetric.Tongue protrusion into cheek strength was normal. Motor: normal bulk and tone, full strength in the BUE, BLE, fine finger  movements normal, no pronator drift. No focal weakness Sensory: Mild diminished pinprick in the left lower extremity to mid calf, vibratory normal position sense normal Coordination: finger-nose-finger, heel-to-shin bilaterally, no dysmetria Reflexes: Brachioradialis 2/2, biceps 2/2, triceps 2/2, patellar 2/2, Achilles 2/2, plantar responses were flexor bilaterally. Gait and Station: Rising up from seated position without assistance, normal stance,  moderate stride, good arm swing, smooth turning, able to perform tiptoe, and heel walking without difficulty. Tandem gait is steady  DIAGNOSTIC DATA (LABS, IMAGING, TESTING) -  ASSESSMENT AND PLAN  53 y.o. year old male  has a past medical history of DVT (deep venous thrombosis); S/P aortic valve replacement; Subacute bacterial endocarditis (SBE); Migraine headache;  CVA (cerebral infarction); and Aneurysm of ascending aorta. here to followup. He has not had further stroke symptoms. His paresthesias are well controlled on gabapentin  Continue Gabapentin 800mg  BID. Continue Coumadin for prosthetic heart valve with INR goal 2-3.5. Last done 04/20/13 was 3.8.  F/U yearly Dennie Bible, [facility], [facility], APRN  [facility] [address] [place],  [address] [phone]

## 2013-04-29 NOTE — Patient Instructions (Signed)
Continue Gabapentin 800mg  BID. Continue Coumadin for prosthetic heart valve with INR goal 2-3.5 F/U yearly

## 2013-05-05 ENCOUNTER — Ambulatory Visit (INDEPENDENT_AMBULATORY_CARE_PROVIDER_SITE_OTHER): Payer: BC Managed Care – PPO

## 2013-05-05 DIAGNOSIS — Z954 Presence of other heart-valve replacement: Secondary | ICD-10-CM

## 2013-05-05 DIAGNOSIS — Z7901 Long term (current) use of anticoagulants: Secondary | ICD-10-CM

## 2013-05-05 DIAGNOSIS — I6350 Cerebral infarction due to unspecified occlusion or stenosis of unspecified cerebral artery: Secondary | ICD-10-CM

## 2013-05-05 DIAGNOSIS — I359 Nonrheumatic aortic valve disorder, unspecified: Secondary | ICD-10-CM

## 2013-05-05 DIAGNOSIS — Z5181 Encounter for therapeutic drug level monitoring: Secondary | ICD-10-CM

## 2013-05-05 LAB — POCT INR: INR: 3.4

## 2013-06-02 ENCOUNTER — Ambulatory Visit (INDEPENDENT_AMBULATORY_CARE_PROVIDER_SITE_OTHER): Payer: BC Managed Care – PPO | Admitting: *Deleted

## 2013-06-02 DIAGNOSIS — Z954 Presence of other heart-valve replacement: Secondary | ICD-10-CM

## 2013-06-02 DIAGNOSIS — I6350 Cerebral infarction due to unspecified occlusion or stenosis of unspecified cerebral artery: Secondary | ICD-10-CM

## 2013-06-02 DIAGNOSIS — Z7901 Long term (current) use of anticoagulants: Secondary | ICD-10-CM

## 2013-06-02 DIAGNOSIS — Z5181 Encounter for therapeutic drug level monitoring: Secondary | ICD-10-CM

## 2013-06-02 DIAGNOSIS — I359 Nonrheumatic aortic valve disorder, unspecified: Secondary | ICD-10-CM

## 2013-06-02 LAB — POCT INR: INR: 2.7

## 2013-06-25 ENCOUNTER — Encounter: Payer: Self-pay | Admitting: *Deleted

## 2013-07-03 ENCOUNTER — Encounter: Payer: Self-pay | Admitting: Cardiology

## 2013-07-03 ENCOUNTER — Ambulatory Visit (INDEPENDENT_AMBULATORY_CARE_PROVIDER_SITE_OTHER): Payer: BC Managed Care – PPO | Admitting: Cardiology

## 2013-07-03 ENCOUNTER — Ambulatory Visit (INDEPENDENT_AMBULATORY_CARE_PROVIDER_SITE_OTHER): Payer: BC Managed Care – PPO

## 2013-07-03 VITALS — BP 118/72 | HR 65 | Ht 71.0 in | Wt 188.0 lb

## 2013-07-03 DIAGNOSIS — Z7901 Long term (current) use of anticoagulants: Secondary | ICD-10-CM

## 2013-07-03 DIAGNOSIS — I359 Nonrheumatic aortic valve disorder, unspecified: Secondary | ICD-10-CM

## 2013-07-03 DIAGNOSIS — Z954 Presence of other heart-valve replacement: Secondary | ICD-10-CM

## 2013-07-03 DIAGNOSIS — Z5181 Encounter for therapeutic drug level monitoring: Secondary | ICD-10-CM

## 2013-07-03 DIAGNOSIS — I6350 Cerebral infarction due to unspecified occlusion or stenosis of unspecified cerebral artery: Secondary | ICD-10-CM

## 2013-07-03 LAB — COMPREHENSIVE METABOLIC PANEL
ALT: 19 U/L (ref 0–53)
AST: 26 U/L (ref 0–37)
Albumin: 4 g/dL (ref 3.5–5.2)
Alkaline Phosphatase: 62 U/L (ref 39–117)
BUN: 17 mg/dL (ref 6–23)
CO2: 29 mEq/L (ref 19–32)
Calcium: 9.3 mg/dL (ref 8.4–10.5)
Chloride: 104 mEq/L (ref 96–112)
Creatinine, Ser: 0.8 mg/dL (ref 0.4–1.5)
GFR: 111.11 mL/min (ref >60.00)
Glucose, Bld: 86 mg/dL (ref 70–99)
Potassium: 4.3 mEq/L (ref 3.5–5.1)
Sodium: 139 mEq/L (ref 135–145)
Total Bilirubin: 1.5 mg/dL — ABNORMAL HIGH (ref 0.3–1.2)
Total Protein: 6.7 g/dL (ref 6.0–8.3)

## 2013-07-03 LAB — LIPID PANEL
Cholesterol: 210 mg/dL — ABNORMAL HIGH (ref 0–200)
HDL: 45.1 mg/dL (ref >39.00)
LDL Cholesterol: 145 mg/dL — ABNORMAL HIGH (ref 0–99)
Total CHOL/HDL Ratio: 5
Triglycerides: 99 mg/dL (ref 0.0–149.0)
VLDL: 19.8 mg/dL (ref 0.0–40.0)

## 2013-07-03 LAB — CBC
HCT: 45.5 % (ref 39.0–52.0)
Hemoglobin: 15.2 g/dL (ref 13.0–17.0)
MCHC: 33.3 g/dL (ref 30.0–36.0)
MCV: 88.6 fl (ref 78.0–100.0)
Platelets: 277 10*3/uL (ref 150.0–400.0)
RBC: 5.14 Mil/uL (ref 4.22–5.81)
RDW: 12.7 % (ref 11.5–14.6)
WBC: 7.1 10*3/uL (ref 4.5–10.5)

## 2013-07-03 LAB — POCT INR: INR: 2.4

## 2013-07-03 NOTE — Patient Instructions (Signed)
Your physician recommends that you return for lab work today for [place].  Your physician has requested that you have an echocardiogram. Echocardiography is a painless test that uses sound waves to create images of your heart. It provides your doctor with information about the size and shape of your heart and how well your heart's chambers and valves are working. This procedure takes approximately one hour. There are no restrictions for this procedure.  Your physician wants you to follow-up in: 1 year with Dr. [name]. You will receive a reminder letter in the mail two months in advance. If you don't receive a letter, please call our office to schedule the follow-up appointment.

## 2013-07-03 NOTE — Progress Notes (Signed)
Patient ID: NOLYN SWAB, male   DOB: 30-Aug-1960, 53 y.o.   MRN: [id number]     Patient Name: Robert [name] Date of Encounter: 07/03/2013  Primary Care Provider:  Wyatt Haste, MD Primary Cardiologist:  Dorothy Spark [facility] Dr. Verl Blalock)  Problem List   Past Medical History  Diagnosis Date  . DVT (deep venous thrombosis)   . S/P aortic valve replacement   . Subacute bacterial endocarditis (SBE)   . Migraine headache   . Renal stone   . CVA (cerebral infarction)   . Aneurysm of ascending aorta    Past Surgical History  Procedure Laterality Date  . Cardiac catheterization  01/2005  . Aortic valve replacement  2007    Allergies  No Known Allergies  HPI  53 year old gentleman with prior medical history of bicuspid aortic valve and dilated ascending aorta that underwent aortic valve and root replacement in 2007. 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  The patient used to be very active she works as a Land, and used to [employer]. 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  He denies any shortness of breath, chest pain, palpitations, lower extremity edema or syncope. He's very compliant with his Coumadin.  Home Medications  Prior to Admission medications   Medication Sig Start Date End Date Taking? Authorizing Provider  gabapentin (NEURONTIN) 800 MG tablet Take 1 tablet (800 mg total) by mouth 2 (two) times daily. 04/29/13  Yes Dennie Bible, NP  warfarin (COUMADIN) 5 MG tablet 6.25 mg alternate with 5 mg 02/09/13  Yes Dorothy Spark, MD    Family History  Family History  Problem Relation Age of Onset  . CVA Mother 8    hemorrhagic cva    Social History  History   Social History  . Marital Status:  Married    Spouse Name: [name]    Number of Children: 8  . Years of Education: BSEE   Occupational History  .  [employer]   Social History Main Topics  . Smoking status: Never Smoker   . Smokeless tobacco: Never Used  . Alcohol Use: 1.0 oz/week    2 drink(s) per week  . Drug Use: No  . Sexual Activity: Not on file   Other Topics Concern  . Not on file   Social History Narrative   Patient lives at home with his wife [name].    Patient has 8 children. Boys   Patient is an Art gallery manager.           Review of Systems, as per HPI, otherwise negative General:  No chills, fever, night sweats or weight changes.  Cardiovascular:  No chest pain, dyspnea on exertion, edema, orthopnea, palpitations, paroxysmal nocturnal dyspnea. Dermatological: No rash, lesions/masses Respiratory: No cough, dyspnea Urologic: No hematuria, dysuria Abdominal:   No nausea, vomiting, diarrhea, bright red blood per rectum, melena, or hematemesis Neurologic:  No visual changes, wkns, changes in mental status. All other systems reviewed and are otherwise negative except as noted above.  Physical Exam  Blood pressure 118/72, pulse 65, height _0  (1.803 m), weight 188 lb (85.276 kg).  General: Pleasant, NAD Psych: Normal affect. Neuro: Alert and oriented X 3. Moves all extremities spontaneously. HEENT: Normal  Neck: Supple  without bruits or JVD. Lungs:  Resp regular and unlabored, CTA. Heart: RRR no s3, s4, or murmurs. Loud clicks of mechanical valve. Abdomen: Soft, non-tender, non-distended, BS + x 4.  Extremities: No clubbing, cyanosis or edema. DP/PT/Radials 2+ and equal bilaterally.  Labs:  No results found for this basename: CKTOTAL, CKMB, TROPONINI,  in the last 72 hours Lab Results  Component Value Date   WBC 5.5 06/02/2010   HGB 14.6 06/02/2010   HCT 43.0 06/02/2010   MCV 86.1 06/02/2010   PLT 254 06/02/2010    Lab Results  Component Value Date   DDIMER  Value:  0.76        AT THE INHOUSE ESTABLISHED CUTOFF VALUE OF 0.48 ug/mL FEU, THIS ASSAY HAS BEEN DOCUMENTED IN THE LITERATURE TO HAVE A SENSITIVITY AND NEGATIVE PREDICTIVE VALUE OF AT LEAST 98 TO 99%.  THE TEST RESULT SHOULD BE CORRELATED WITH AN ASSESSMENT OF THE CLINICAL PROBABILITY OF DVT / VTE.* 06/07/2008   No components found with this basename: POCBNP,     Component Value Date/Time   NA 142 06/02/2010 0018   K 3.9 06/02/2010 0018   CL 107 06/02/2010 0018   CO2 30 08/13/2008 0000   GLUCOSE 98 06/02/2010 0018   BUN 23 06/02/2010 0018   CREATININE 1.1 06/02/2010 0018   CALCIUM 9.2 08/13/2008 0000   PROT 6.3 07/10/2008 1250   ALBUMIN 3.5 07/10/2008 1250   AST 27 07/10/2008 1250   ALT 38 07/10/2008 1250   ALKPHOS 75 07/10/2008 1250   BILITOT 0.8 07/10/2008 1250   GFRNONAA 96.10 08/13/2008 0000   GFRAA  Value: >60        The eGFR has been calculated using the MDRD equation. This calculation has not been validated in all clinical situations. eGFR's persistently <60 mL/min signify possible Chronic Kidney Disease. 07/10/2008 1250   Lab Results  Component Value Date   CHOL  Value: 142        ATP III CLASSIFICATION:  <200     mg/dL   Desirable  200-239  mg/dL   Borderline High  >=240    mg/dL   High        06/08/2008   HDL 27* 06/08/2008   LDLCALC  Value: 105        Total Cholesterol/HDL:CHD Risk Coronary Heart Disease Risk Table                     Men   Women  1/2 Average Risk   3.4   3.3  Average Risk       5.0   4.4  2 X Average Risk   9.6   7.1  3 X Average Risk  23.4   11.0        Use the calculated Patient Ratio above and the CHD Risk Table to determine the patient's CHD Risk.        ATP III CLASSIFICATION (LDL):  <100     mg/dL   Optimal  100-129  mg/dL   Near or Above                    Optimal  130-159  mg/dL   Borderline  160-189  mg/dL   High  >190     mg/dL   Very High* 06/08/2008   TRIG 50 06/08/2008    Accessory Clinical Findings  Echocardiogram 2010  1. Left ventricle: The cavity size was normal.  Systolic function was normal. The estimated ejection fraction  was in the range of 55% to 60%. Wall motion was normal; there were no regional wall motion abnormalities. 2. Aortic valve: A bileaflet prosthesis was present. 3. Mitral valve: Mild regurgitation. 4. Left atrium: The atrium was mildly dilated. 5. Atrial septum: There was redundancy of the septum, with borderline criteria for aneurysm. 6. Pericardium, extracardiac: A trivial pericardial effusion was identified.  ECG - normal sinus rhythm, 65 beats per minute normal EKG   Assessment & Plan  A pleasant 53 year old gentleman  1. history of bicuspid aortic valve and aortic root dilatation status post AVR and aortic root replacement in 2007 and aVR with mechanical bicuspid valve in 2010 for infection complicated by embolic events. Patient has chronic Coumadin and he is very compliant. Her last echocardiogram was performed in 2010 we will order a new one.  2. blood pressure - controlled  3. Lipids - not checked since 2010 we will check today together with CBC and comprehensive metabolic profile.  The patient will followup in one year and he was advised what type of exercise he is allowed to cc encouraged to do a cardio exercise but avoid isometric exercise with his mechanical valve.   Dorothy Spark, MD, [facility] 07/03/2013, 8:52 AM

## 2013-07-06 ENCOUNTER — Telehealth: Payer: Self-pay

## 2013-07-06 NOTE — Telephone Encounter (Signed)
**Note De-Identified Robert [name] Obfuscation** The pt states that he wants to try to lower his LDL and cholesterol level on his own without having to take medications. I have mailed several handouts to the pts address to help him in his efforts.

## 2013-07-06 NOTE — Telephone Encounter (Signed)
**Note De-Identified Ira Busbin Obfuscation** Message copied by Nirvaan Frett M on Mon Jul 06, 2013  1:08 PM ------      Message from: [place], [place] H      Created: Mon Jul 06, 2013  6:58 AM       To Jeani Hawking. ------

## 2013-07-10 ENCOUNTER — Ambulatory Visit (HOSPITAL_COMMUNITY): Payer: BC Managed Care – PPO | Attending: Cardiology | Admitting: Cardiology

## 2013-07-10 DIAGNOSIS — Q231 Congenital insufficiency of aortic valve: Secondary | ICD-10-CM

## 2013-07-10 DIAGNOSIS — I359 Nonrheumatic aortic valve disorder, unspecified: Secondary | ICD-10-CM

## 2013-07-10 DIAGNOSIS — I719 Aortic aneurysm of unspecified site, without rupture: Secondary | ICD-10-CM

## 2013-07-10 DIAGNOSIS — Z954 Presence of other heart-valve replacement: Secondary | ICD-10-CM

## 2013-07-10 NOTE — Progress Notes (Signed)
Echo performed. 

## 2013-07-15 ENCOUNTER — Telehealth: Payer: Self-pay

## 2013-07-15 DIAGNOSIS — E7800 Pure hypercholesterolemia, unspecified: Secondary | ICD-10-CM

## 2013-07-15 NOTE — Telephone Encounter (Signed)
Spoke to patient 07/15/13 echo results given.Patient stated he has been following a better diet.Stated he would like to repeat lab work in a few months to make sure diet is bringing down cholesterol.Fasting lipid and liver panels scheduled in 3 months 10/27/13.

## 2013-07-28 ENCOUNTER — Ambulatory Visit
Admission: RE | Admit: 2013-07-28 | Discharge: 2013-07-28 | Disposition: A | Discharge status: Home / Self Care | Payer: BC Managed Care – PPO | Source: Ambulatory Visit | Attending: Family Medicine | Admitting: Family Medicine

## 2013-07-28 ENCOUNTER — Ambulatory Visit (INDEPENDENT_AMBULATORY_CARE_PROVIDER_SITE_OTHER): Payer: BC Managed Care – PPO | Admitting: Family Medicine

## 2013-07-28 ENCOUNTER — Encounter: Payer: Self-pay | Admitting: Family Medicine

## 2013-07-28 VITALS — Wt 183.0 lb

## 2013-07-28 DIAGNOSIS — M25531 Pain in right wrist: Secondary | ICD-10-CM

## 2013-07-28 DIAGNOSIS — M25539 Pain in unspecified wrist: Secondary | ICD-10-CM

## 2013-07-29 ENCOUNTER — Encounter: Payer: Self-pay | Admitting: Family Medicine

## 2013-07-29 NOTE — Progress Notes (Signed)
   Subjective:    Patient ID: Robert [name], male    DOB: January 20, 1961, 53 y.o.   MRN: [id number]  HPI He injured his right wrist several days ago while doing some yard work. He describes motion of trying to break a 20 experiencing pain in the carpal area.   Review of Systems     Objective:   Physical Exam Exam of the right wrist does show some tenderness to palpation over the navicular as well as over the midportion of the carpals. Swelling is noted across the entire wrist. X-ray does show some previous injury to the wrist.       Assessment & Plan:  Right wrist pain - Plan: DG Wrist Complete Right  I explained the x-rays to him. At this point we will give it 2-4 weeks to quiet down he and if continued difficulty referred to Dr. Amedeo Plenty to further evaluate soft tissue injury

## 2013-07-31 ENCOUNTER — Ambulatory Visit (INDEPENDENT_AMBULATORY_CARE_PROVIDER_SITE_OTHER): Payer: BC Managed Care – PPO | Admitting: Pharmacist

## 2013-07-31 DIAGNOSIS — Z954 Presence of other heart-valve replacement: Secondary | ICD-10-CM

## 2013-07-31 DIAGNOSIS — Z5181 Encounter for therapeutic drug level monitoring: Secondary | ICD-10-CM

## 2013-07-31 DIAGNOSIS — I6350 Cerebral infarction due to unspecified occlusion or stenosis of unspecified cerebral artery: Secondary | ICD-10-CM

## 2013-07-31 DIAGNOSIS — Z7901 Long term (current) use of anticoagulants: Secondary | ICD-10-CM

## 2013-07-31 DIAGNOSIS — I359 Nonrheumatic aortic valve disorder, unspecified: Secondary | ICD-10-CM

## 2013-07-31 LAB — POCT INR: INR: 3.2

## 2013-08-18 ENCOUNTER — Other Ambulatory Visit: Payer: Self-pay | Admitting: *Deleted

## 2013-08-18 ENCOUNTER — Other Ambulatory Visit: Payer: Self-pay | Admitting: Pharmacist Clinician (PhC)/ Clinical Pharmacy Specialist

## 2013-08-18 MED ORDER — WARFARIN SODIUM 5 MG PO TABS: ORAL_TABLET | ORAL | Status: DC

## 2013-08-28 ENCOUNTER — Ambulatory Visit (INDEPENDENT_AMBULATORY_CARE_PROVIDER_SITE_OTHER): Payer: BC Managed Care – PPO

## 2013-08-28 DIAGNOSIS — Z954 Presence of other heart-valve replacement: Secondary | ICD-10-CM

## 2013-08-28 DIAGNOSIS — I6350 Cerebral infarction due to unspecified occlusion or stenosis of unspecified cerebral artery: Secondary | ICD-10-CM

## 2013-08-28 DIAGNOSIS — Z7901 Long term (current) use of anticoagulants: Secondary | ICD-10-CM

## 2013-08-28 DIAGNOSIS — I359 Nonrheumatic aortic valve disorder, unspecified: Secondary | ICD-10-CM

## 2013-08-28 DIAGNOSIS — Z5181 Encounter for therapeutic drug level monitoring: Secondary | ICD-10-CM

## 2013-08-28 LAB — POCT INR: INR: 3.2

## 2013-09-21 ENCOUNTER — Other Ambulatory Visit: Payer: Self-pay

## 2013-09-21 MED ORDER — GABAPENTIN 800 MG PO TABS: 800.0000 mg | ORAL_TABLET | Freq: Two times a day (BID) | ORAL | Status: DC

## 2013-09-22 ENCOUNTER — Other Ambulatory Visit: Payer: Self-pay | Admitting: *Deleted

## 2013-09-22 MED ORDER — WARFARIN SODIUM 5 MG PO TABS: ORAL_TABLET | ORAL | Status: DC

## 2013-10-02 ENCOUNTER — Ambulatory Visit (INDEPENDENT_AMBULATORY_CARE_PROVIDER_SITE_OTHER): Payer: BC Managed Care – PPO

## 2013-10-02 DIAGNOSIS — Z954 Presence of other heart-valve replacement: Secondary | ICD-10-CM

## 2013-10-02 DIAGNOSIS — I6350 Cerebral infarction due to unspecified occlusion or stenosis of unspecified cerebral artery: Secondary | ICD-10-CM

## 2013-10-02 DIAGNOSIS — I359 Nonrheumatic aortic valve disorder, unspecified: Secondary | ICD-10-CM

## 2013-10-02 DIAGNOSIS — Z5181 Encounter for therapeutic drug level monitoring: Secondary | ICD-10-CM

## 2013-10-02 DIAGNOSIS — Z7901 Long term (current) use of anticoagulants: Secondary | ICD-10-CM

## 2013-10-02 LAB — POCT INR: INR: 4.1

## 2013-10-27 ENCOUNTER — Other Ambulatory Visit: Payer: BC Managed Care – PPO

## 2013-10-30 ENCOUNTER — Other Ambulatory Visit: Payer: BC Managed Care – PPO

## 2013-11-03 ENCOUNTER — Ambulatory Visit (INDEPENDENT_AMBULATORY_CARE_PROVIDER_SITE_OTHER): Payer: BC Managed Care – PPO | Admitting: *Deleted

## 2013-11-03 DIAGNOSIS — Z7901 Long term (current) use of anticoagulants: Secondary | ICD-10-CM

## 2013-11-03 DIAGNOSIS — Z954 Presence of other heart-valve replacement: Secondary | ICD-10-CM

## 2013-11-03 DIAGNOSIS — I6350 Cerebral infarction due to unspecified occlusion or stenosis of unspecified cerebral artery: Secondary | ICD-10-CM

## 2013-11-03 DIAGNOSIS — I359 Nonrheumatic aortic valve disorder, unspecified: Secondary | ICD-10-CM

## 2013-11-03 DIAGNOSIS — Z5181 Encounter for therapeutic drug level monitoring: Secondary | ICD-10-CM

## 2013-11-03 LAB — POCT INR: INR: 4

## 2014-02-04 ENCOUNTER — Other Ambulatory Visit (INDEPENDENT_AMBULATORY_CARE_PROVIDER_SITE_OTHER): Payer: BC Managed Care – PPO | Admitting: *Deleted

## 2014-02-04 ENCOUNTER — Ambulatory Visit (INDEPENDENT_AMBULATORY_CARE_PROVIDER_SITE_OTHER): Payer: BC Managed Care – PPO | Admitting: Pharmacist

## 2014-02-04 DIAGNOSIS — I639 Cerebral infarction, unspecified: Secondary | ICD-10-CM

## 2014-02-04 DIAGNOSIS — Z5181 Encounter for therapeutic drug level monitoring: Secondary | ICD-10-CM

## 2014-02-04 DIAGNOSIS — Z7901 Long term (current) use of anticoagulants: Secondary | ICD-10-CM

## 2014-02-04 DIAGNOSIS — E7800 Pure hypercholesterolemia, unspecified: Secondary | ICD-10-CM

## 2014-02-04 DIAGNOSIS — Z954 Presence of other heart-valve replacement: Secondary | ICD-10-CM

## 2014-02-04 DIAGNOSIS — I6350 Cerebral infarction due to unspecified occlusion or stenosis of unspecified cerebral artery: Secondary | ICD-10-CM

## 2014-02-04 DIAGNOSIS — I359 Nonrheumatic aortic valve disorder, unspecified: Secondary | ICD-10-CM

## 2014-02-04 DIAGNOSIS — Z952 Presence of prosthetic heart valve: Secondary | ICD-10-CM

## 2014-02-04 LAB — LIPID PANEL
Cholesterol: 220 mg/dL — ABNORMAL HIGH (ref 0–200)
HDL: 43.4 mg/dL (ref >39.00)
LDL Cholesterol: 160 mg/dL — ABNORMAL HIGH (ref 0–99)
NonHDL: 176.6
Total CHOL/HDL Ratio: 5
Triglycerides: 82 mg/dL (ref 0.0–149.0)
VLDL: 16.4 mg/dL (ref 0.0–40.0)

## 2014-02-04 LAB — HEPATIC FUNCTION PANEL
ALT: 26 U/L (ref 0–53)
AST: 30 U/L (ref 0–37)
Albumin: 3.5 g/dL (ref 3.5–5.2)
Alkaline Phosphatase: 69 U/L (ref 39–117)
Bilirubin, Direct: 0.1 mg/dL (ref 0.0–0.3)
Total Bilirubin: 2 mg/dL — ABNORMAL HIGH (ref 0.2–1.2)
Total Protein: 6.5 g/dL (ref 6.0–8.3)

## 2014-02-04 LAB — POCT INR: INR: 3.1

## 2014-02-05 ENCOUNTER — Other Ambulatory Visit: Payer: Self-pay | Admitting: *Deleted

## 2014-02-05 DIAGNOSIS — E785 Hyperlipidemia, unspecified: Secondary | ICD-10-CM

## 2014-02-05 MED ORDER — ATORVASTATIN CALCIUM 10 MG PO TABS: 10.0000 mg | ORAL_TABLET | Freq: Every day | ORAL | Status: DC

## 2014-02-11 ENCOUNTER — Telehealth: Payer: Self-pay | Admitting: Cardiology

## 2014-02-11 NOTE — Telephone Encounter (Signed)
LMTCB in regards to medication questions and eating grapefruits

## 2014-02-11 NOTE — Telephone Encounter (Signed)
New Msg   Patient wants to know if he can eat grapefruit with one of his meds and would like a call back.

## 2014-02-11 NOTE — Telephone Encounter (Signed)
Pt calling to ask Dr [name] if its ok to eat grape fruits with atorvastatin.  Asked our PharmD Cyril Mourning, and she states that pt cannot eat grape fruits while taking atorvastatin.  Pt states he is working on a lifestyle change, in order to get his cholesterol back to normal, and start coming off the atorvastatin.  Pt verbalized understanding and gracious for all the assistance provided.

## 2014-02-11 NOTE — Telephone Encounter (Signed)
F/u   Patient is returning call. Patient is concerned about eating grapefruit with one of his medications.

## 2014-02-15 ENCOUNTER — Ambulatory Visit (INDEPENDENT_AMBULATORY_CARE_PROVIDER_SITE_OTHER): Payer: BC Managed Care – PPO | Admitting: Family Medicine

## 2014-02-15 ENCOUNTER — Encounter: Payer: Self-pay | Admitting: Family Medicine

## 2014-02-15 VITALS — BP 112/60 | HR 62 | Ht 71.0 in | Wt 181.0 lb

## 2014-02-15 DIAGNOSIS — Z1211 Encounter for screening for malignant neoplasm of colon: Secondary | ICD-10-CM

## 2014-02-15 DIAGNOSIS — Z7901 Long term (current) use of anticoagulants: Secondary | ICD-10-CM

## 2014-02-15 DIAGNOSIS — G43009 Migraine without aura, not intractable, without status migrainosus: Secondary | ICD-10-CM

## 2014-02-15 DIAGNOSIS — Z0000 Encounter for general adult medical examination without abnormal findings: Secondary | ICD-10-CM

## 2014-02-15 DIAGNOSIS — Z952 Presence of prosthetic heart valve: Secondary | ICD-10-CM

## 2014-02-15 DIAGNOSIS — Z954 Presence of other heart-valve replacement: Secondary | ICD-10-CM

## 2014-02-15 DIAGNOSIS — I6930 Unspecified sequelae of cerebral infarction: Secondary | ICD-10-CM

## 2014-02-15 DIAGNOSIS — Z23 Encounter for immunization: Secondary | ICD-10-CM

## 2014-02-15 DIAGNOSIS — E785 Hyperlipidemia, unspecified: Secondary | ICD-10-CM | POA: Insufficient documentation

## 2014-02-15 NOTE — Progress Notes (Signed)
Subjective:     Patient ID: Robert [name], male   DOB: 05/20/1960, 53 y.o.   MRN: [id number]  HPI  This is a 53 year old male with a history of mechanical valve replacement, thoracic aortic aneurysm repair, and left-sided embolic strokes who presents for a comprehensive physical exam.  He was diagnosed as a child with bicuspid aortic valve and decided to follow up with a cardiologist in his 76s.  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  To address this his porcine valve was replaced with a mechanical valve in 2013 and he remains on anticoagulation.  In the interval since his last exam he has had no acute complaints.  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  His INR is monitored by his cardiologist. He also has history of migraine headaches that are adequately controlled  He has not received his annual flu shot and is due for his first screening colonoscopy.  Notable family history: Mother with amyloid angiopathy and hemorrhagic CVA.   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  Runs daily and 5Ks frequently.  Non-smoker.  Non-drinker He is married and his marriage is going well  Review of Systems  A 12 system review of systems was negative except for some numbness of the L ring and small finger with occasional sharp pain on the lateral aspect of his left foot.    Objective:   Physical Exam Filed Vitals:   02/15/14 1430  BP: 112/60  Pulse: 62  BP 112/60 mmHg  Pulse 62  Ht 5\' 11"  (1.803 m)  Wt 181 lb (82.101 kg)  BMI 25.26 kg/m2  SpO2 98%  General Appearance:    Alert, cooperative, no distress, appears stated age  Head:    Normocephalic, without obvious  abnormality, atraumatic  Eyes:    PERRL, conjunctiva/corneas clear, EOM's intact, fundi    benign, both eyes       Ears:    Normal TM's and external ear canals, both ears  Nose:   Nares normal, septum midline, mucosa normal, no drainage   or sinus tenderness  Throat:   Lips, mucosa, and tongue normal; teeth and gums normal  Neck:   Supple, symmetrical, trachea midline, no adenopathy;       thyroid:  No enlargement/tenderness/nodules; no carotid   bruit or JVD  Back:     Symmetric, no curvature, ROM normal, no CVA tenderness  Lungs:     Clear to auscultation bilaterally, respirations unlabored  Chest wall:    No tenderness or deformity  Heart:    Regular rate and rhythm, faint early systolic ejection murmur with hard S2(click)  Abdomen:     Soft, non-tender, bowel sounds active all four quadrants,    no masses, no organomegaly  Genitalia:    Deferred  Rectal:    Deferred  Extremities:   Extremities normal, atraumatic, no cyanosis or edema  Pulses:   2+ and symmetric all extremities  Skin:   Skin color, texture, turgor normal, no rashes or lesions  Lymph nodes:   Cervical, supraclavicular, and axillary nodes normal  Neurologic:  CNII-XII intact.  4/5 strength of of flexion of the L ring and small finger.   Total Chol 220, LDL 160  02/05/14  INR 3.0-4.0 last 6 months.    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  He needs a screening colonoscopy now that he has reached age 58 and is due for a seasonal flu shot.  He is compliant with his anticoagulation and his NR has been within target range for the last 6 months.  He should remain on gabapentin for his neuropathic pain as it is well controlled.  His response to statin therapy is being assessed by his cardiologist and we will monitor as well.  Plan:   Special screening for malignant neoplasms, colon - Colonoscopy scheduled. - Plan:  Ambulatory referral to Gastroenterology  Need for prophylactic vaccination and inoculation against influenza - Plan: Flu Vaccine QUAD 36+ mos PF IM (Fluarix Quad PF)  Need for Tdap vaccination - Plan: Tdap vaccine greater than or equal to 7yo IM  Long term current use of anticoagulant therapy - Continue to monitor with cardiologist  History of CVA with residual deficit - Embolic source removed and long-term anticoagulation initiated.  History of aortic valve replacement  Hyperlipidemia LDL goal <100 - Atorvastatin 10 started by cardiologist; follow up for response to therapy.  Migraine without aura and without status migrainosus, not intractable   - C. Albertha Ghee, MS3, [facility] Seen with Dr. [name] who is in agreement with the above findings and plan.

## 2014-02-15 NOTE — Progress Notes (Signed)
   Subjective:    Patient ID: Robert [name], male    DOB: Mar 18, 1961, 53 y.o.   MRN: [id number]  HPI    Review of Systems     Objective:   Physical Exam        Assessment & Plan:

## 2014-03-02 ENCOUNTER — Encounter: Payer: Self-pay | Admitting: Internal Medicine

## 2014-03-02 ENCOUNTER — Other Ambulatory Visit (INDEPENDENT_AMBULATORY_CARE_PROVIDER_SITE_OTHER): Payer: BC Managed Care – PPO | Admitting: *Deleted

## 2014-03-02 ENCOUNTER — Ambulatory Visit (INDEPENDENT_AMBULATORY_CARE_PROVIDER_SITE_OTHER): Payer: BC Managed Care – PPO

## 2014-03-02 DIAGNOSIS — I6350 Cerebral infarction due to unspecified occlusion or stenosis of unspecified cerebral artery: Secondary | ICD-10-CM

## 2014-03-02 DIAGNOSIS — E785 Hyperlipidemia, unspecified: Secondary | ICD-10-CM

## 2014-03-02 DIAGNOSIS — Z954 Presence of other heart-valve replacement: Secondary | ICD-10-CM

## 2014-03-02 DIAGNOSIS — I359 Nonrheumatic aortic valve disorder, unspecified: Secondary | ICD-10-CM

## 2014-03-02 DIAGNOSIS — I639 Cerebral infarction, unspecified: Secondary | ICD-10-CM

## 2014-03-02 DIAGNOSIS — Z952 Presence of prosthetic heart valve: Secondary | ICD-10-CM

## 2014-03-02 DIAGNOSIS — Z7901 Long term (current) use of anticoagulants: Secondary | ICD-10-CM

## 2014-03-02 LAB — BASIC METABOLIC PANEL
BUN: 23 mg/dL (ref 6–23)
CO2: 28 mEq/L (ref 19–32)
Calcium: 8.7 mg/dL (ref 8.4–10.5)
Chloride: 105 mEq/L (ref 96–112)
Creatinine, Ser: 0.8 mg/dL (ref 0.4–1.5)
GFR: 101.74 mL/min (ref >60.00)
Glucose, Bld: 89 mg/dL (ref 70–99)
Potassium: 4.1 mEq/L (ref 3.5–5.1)
Sodium: 138 mEq/L (ref 135–145)

## 2014-03-02 LAB — HEPATIC FUNCTION PANEL
ALT: 27 U/L (ref 0–53)
AST: 28 U/L (ref 0–37)
Albumin: 4 g/dL (ref 3.5–5.2)
Alkaline Phosphatase: 62 U/L (ref 39–117)
Bilirubin, Direct: 0.1 mg/dL (ref 0.0–0.3)
Total Bilirubin: 1.5 mg/dL — ABNORMAL HIGH (ref 0.2–1.2)
Total Protein: 6.5 g/dL (ref 6.0–8.3)

## 2014-03-02 LAB — LIPID PANEL
Cholesterol: 151 mg/dL (ref 0–200)
HDL: 50.2 mg/dL (ref >39.00)
LDL Cholesterol: 85 mg/dL (ref 0–99)
NonHDL: 100.8
Total CHOL/HDL Ratio: 3
Triglycerides: 79 mg/dL (ref 0.0–149.0)
VLDL: 15.8 mg/dL (ref 0.0–40.0)

## 2014-03-02 LAB — POCT INR: INR: 3

## 2014-03-04 ENCOUNTER — Other Ambulatory Visit: Payer: BC Managed Care – PPO

## 2014-03-12 ENCOUNTER — Other Ambulatory Visit: Payer: Self-pay | Admitting: *Deleted

## 2014-03-12 DIAGNOSIS — E785 Hyperlipidemia, unspecified: Secondary | ICD-10-CM

## 2014-03-12 MED ORDER — ATORVASTATIN CALCIUM 10 MG PO TABS: 10.0000 mg | ORAL_TABLET | Freq: Every day | ORAL | Status: DC

## 2014-04-02 ENCOUNTER — Ambulatory Visit (INDEPENDENT_AMBULATORY_CARE_PROVIDER_SITE_OTHER): Payer: BLUE CROSS/BLUE SHIELD | Admitting: *Deleted

## 2014-04-02 DIAGNOSIS — I359 Nonrheumatic aortic valve disorder, unspecified: Secondary | ICD-10-CM

## 2014-04-02 DIAGNOSIS — Z952 Presence of prosthetic heart valve: Secondary | ICD-10-CM

## 2014-04-02 DIAGNOSIS — Z954 Presence of other heart-valve replacement: Secondary | ICD-10-CM

## 2014-04-02 DIAGNOSIS — Z7901 Long term (current) use of anticoagulants: Secondary | ICD-10-CM

## 2014-04-02 DIAGNOSIS — I639 Cerebral infarction, unspecified: Secondary | ICD-10-CM

## 2014-04-02 DIAGNOSIS — I6350 Cerebral infarction due to unspecified occlusion or stenosis of unspecified cerebral artery: Secondary | ICD-10-CM

## 2014-04-02 LAB — POCT INR: INR: 2.3

## 2014-04-20 ENCOUNTER — Ambulatory Visit: Payer: BLUE CROSS/BLUE SHIELD | Admitting: Gastroenterology

## 2014-04-21 ENCOUNTER — Encounter: Payer: Self-pay | Admitting: Gastroenterology

## 2014-04-21 ENCOUNTER — Ambulatory Visit (INDEPENDENT_AMBULATORY_CARE_PROVIDER_SITE_OTHER): Payer: BLUE CROSS/BLUE SHIELD | Admitting: Gastroenterology

## 2014-04-21 VITALS — BP 102/58 | HR 68 | Ht 70.5 in | Wt 186.2 lb

## 2014-04-21 DIAGNOSIS — Z7901 Long term (current) use of anticoagulants: Secondary | ICD-10-CM

## 2014-04-21 DIAGNOSIS — Z1211 Encounter for screening for malignant neoplasm of colon: Secondary | ICD-10-CM | POA: Insufficient documentation

## 2014-04-21 NOTE — Progress Notes (Signed)
     04/21/2014 Robert [name] [id number] [birth year]   HISTORY OF PRESENT ILLNESS:  This is a pleasant 54 year old male who is here today for screening colonoscopy.  He is on coumadin for mechanical AVR and was last seen by Dr. Ena Dawley in 06/2013; follows with coumadin clinic.  He is actually already on the schedule with Dr. Carlean Purl on 05/05/2014.  He denies any GI complaints.     Past Medical History  Diagnosis Date  . DVT (deep venous thrombosis)   . S/P aortic valve replacement   . Subacute bacterial endocarditis (SBE)   . Migraine headache   . Renal stone   . CVA (cerebral infarction)   . Aneurysm of ascending aorta    Past Surgical History  Procedure Laterality Date  . Cardiac catheterization  01/2005  . Aortic valve replacement  2007    reports that he has never smoked. He has never used smokeless tobacco. He reports that he drinks about 1.2 oz of alcohol per week. He reports that he does not use illicit drugs. family history includes CVA (age of onset: 32) in his mother; Colon cancer in his paternal grandfather and paternal grandmother. There is no history of Diabetes, Kidney disease, Esophageal cancer, Gallbladder disease, or Heart disease. No Known Allergies    Outpatient Encounter Prescriptions as of 04/21/2014  Medication Sig  . atorvastatin (LIPITOR) 10 MG tablet Take 1 tablet (10 mg total) by mouth daily.  Marland Kitchen gabapentin (NEURONTIN) 800 MG tablet Take 1 tablet (800 mg total) by mouth 2 (two) times daily.  Marland Kitchen warfarin (COUMADIN) 5 MG tablet 1.5 pills everyday except 1 pill on Mondays, Wednesdays and Fridays. or as directed by coumadin clinic     REVIEW OF SYSTEMS  : All other systems reviewed and negative except where noted in the History of Present Illness.   PHYSICAL EXAM: BP 102/58 mmHg  Pulse 68  Ht 5' 10.5" (1.791 m)  Wt 186 lb 4 oz (84.482 kg)  BMI 26.34 kg/m2 General: Well developed white male in no acute distress Head: Normocephalic and  atraumatic Eyes:  Sclerae anicteric, conjunctiva pink. Ears: Normal auditory acuity Lungs: Clear throughout to auscultation Heart: Regular rate and rhythm.  Valve click noted. Abdomen: Soft, non-distended.  BS present.  Non-tender. Rectal:  Will be done at the time of his colonoscopy. Musculoskeletal: Symmetrical with no gross deformities  Skin: No lesions on visible extremities Extremities: No edema  Neurological: Alert oriented x 4, grossly non-focal Psychological:  Alert and cooperative. Normal mood and affect  ASSESSMENT AND PLAN: -Screening colonoscopy:  Already scheduled with Dr. Carlean Purl on 05/05/2014.  The risks, benefits, and alternatives were discussed with the patient and he consents to proceed.  -Chronic anti-coagulation:  Patient has a mechanical heart valve and is on coumadin.  Suspect that they will want to bridge him to lovenox, but we will contact his cardiologist, Dr. [name], for ok to hold coumadin.  The risks benefits and alternatives to a temporary hold of anti-coagulants/anti-platelets for the procedure were discussed with the patient he consents to proceed.

## 2014-04-21 NOTE — Patient Instructions (Signed)
You have been scheduled for a colonoscopy. Please follow written instructions given to you at your visit today.  Please pick up your prep kit at the pharmacy within the next 1-3 days. If you use inhalers (even only as needed), please bring them with you on the day of your procedure. Your physician has requested that you go to www.startemmi.com and enter the access code given to you at your visit today. This web site gives a general overview about your procedure. However, you should still follow specific instructions given to you by our office regarding your preparation for the procedure. You will be contaced by our office prior to your procedure for directions on holding your Coumadin/Warfarin.  If you do not hear from our office 1 week prior to your scheduled procedure, please call [phone] to discuss. CC:  Jill Alexanders MD

## 2014-04-26 NOTE — Progress Notes (Signed)
In general AVR is a low risk valve re: stroke and it should be acceptable to hold warfarin but await cardiology input.  Agree with Ms. Alphia Kava management.  Gatha Mayer, MD, [facility]

## 2014-04-27 ENCOUNTER — Telehealth: Payer: Self-pay | Admitting: Cardiology

## 2014-04-27 ENCOUNTER — Telehealth: Payer: Self-pay

## 2014-04-27 NOTE — Telephone Encounter (Signed)
Will route this message to Dr [name] for further review and recommendation of med clearance and route this information to Dr Owens Loffler through epic.

## 2014-04-27 NOTE — Telephone Encounter (Signed)
-----   Message from Barron Alvine, [place] sent at 04/21/2014  2:49 PM EST ----- Waiting on anti coag from Dr [name] 05/05/14 colon with gessner

## 2014-04-27 NOTE — Telephone Encounter (Signed)
Call placed to Dr [name] regarding coumadin hold and procedure scheduled for 05/05/14, I also resent letter to Dr [name].  Note sent to Dr [name] today per [facility].

## 2014-04-27 NOTE — Telephone Encounter (Signed)
New message      Request for surgical clearance:  1. What type of surgery is being performed? colonoscopy  2. When is this surgery scheduled? 05-05-14  Are there any medications that need to be held prior to surgery and how long? Hold coumadin 5 days prior 3. Name of physician performing surgery? Dr Owens Loffler  4. What is your office phone and fax number? Go thru epic

## 2014-04-28 ENCOUNTER — Ambulatory Visit (INDEPENDENT_AMBULATORY_CARE_PROVIDER_SITE_OTHER): Payer: BLUE CROSS/BLUE SHIELD | Admitting: *Deleted

## 2014-04-28 DIAGNOSIS — I359 Nonrheumatic aortic valve disorder, unspecified: Secondary | ICD-10-CM

## 2014-04-28 DIAGNOSIS — Z952 Presence of prosthetic heart valve: Secondary | ICD-10-CM

## 2014-04-28 DIAGNOSIS — I639 Cerebral infarction, unspecified: Secondary | ICD-10-CM

## 2014-04-28 DIAGNOSIS — Z7901 Long term (current) use of anticoagulants: Secondary | ICD-10-CM

## 2014-04-28 DIAGNOSIS — Z954 Presence of other heart-valve replacement: Secondary | ICD-10-CM

## 2014-04-28 DIAGNOSIS — I6350 Cerebral infarction due to unspecified occlusion or stenosis of unspecified cerebral artery: Secondary | ICD-10-CM

## 2014-04-28 LAB — POCT INR: INR: 2.8

## 2014-04-28 MED ORDER — ENOXAPARIN SODIUM 80 MG/0.8ML ~~LOC~~ SOLN: 80.0000 mg | Freq: Two times a day (BID) | SUBCUTANEOUS | Status: DC

## 2014-04-28 NOTE — Patient Instructions (Addendum)
2/4 Take your last dose of Coumadin.  2/5 Do not take Coumadin or Lovenox.   2/6 Start taking Lovenox 80 mg into the fatty abdominal tissue at 8am and 8pm; 2 inches away from the navel, rotate with each each injection.  2/7 Continue taking Lovenox 80 mg into the fatty abdominal tissue at 8am and 8pm; 2 inches away from the navel, rotate with each each injection.  2/8 Continue taking Lovenox 80 mg into the fatty abdominal tissue at 8am and 8pm; 2 inches away from the navel, rotate with each each injection.  2/9 Continue taking Lovenox 80 mg into the fatty abdominal tissue at 8am and 8pm; 2 inches away from the navel, rotate with each each injection.  2/10 Procedure Day-Do not take any Lovenox.  After procedure resume Coumadin when it is approved to do so by the physician. When resume coumadin take an extra 1/2 tablet for two days. Continue Lovenox injections twice a day-12 hours apart as instructed by physician. [place] with any question [phone]

## 2014-04-28 NOTE — Telephone Encounter (Signed)
Ok to hold coumadine and bridge with lovenox

## 2014-04-28 NOTE — Telephone Encounter (Signed)
Pt seen in clinic on 2/3 and Lovenox instructions given.

## 2014-04-29 ENCOUNTER — Telehealth: Payer: Self-pay

## 2014-04-29 ENCOUNTER — Ambulatory Visit: Payer: BC Managed Care – PPO | Admitting: Nurse Practitioner

## 2014-04-29 NOTE — Telephone Encounter (Signed)
Aris Georgia, RPH at 04/28/2014 9:52 AM     Status: Signed       Expand All Collapse All   Pt seen in clinic on 2/3 and Lovenox instructions given.             Dorothy Spark, MD at 04/28/2014 9:49 AM     Status: Signed       Expand All Collapse All   Ok to hold coumadine and bridge with lovenox            Nuala Alpha, LPN at 03/26/1550 08:02 AM     Status: Signed       Expand All Collapse All   Will route this message to Dr [name] for further review and recommendation of med clearance and route this information to Dr Owens Loffler through epic.            Chauncey Reading Price at 04/27/2014 11:18 AM     Status: Signed       Expand All Collapse All   New message      Request for surgical clearance:  1. What type of surgery is being performed? colonoscopy  2. When is this surgery scheduled? 05-05-14  Are there any medications that need to be held prior to surgery and how long? Hold coumadin 5 days prior 3. Name of physician performing surgery? Dr Owens Loffler  4. What is your office phone and fax number? Go thru epic               Visit Pharmacy     [id number] - [place], [address]       Contacts       Type Contact Phone    04/27/2014 11:18 AM Phone (Incoming) pattie @ dr Olena Heckle office [phone]        Routing History     Priority Sent On From To Message Type     04/28/2014 9:50 AM Dorothy Spark, MD Aris Georgia, [facility] Patient Calls     04/27/2014 11:40 AM Karlene Einstein Rockne Coons, LPN Dorothy Spark, MD Patient Calls     Comment: REVIEW AND ADVISE ON CLEARANCE     04/27/2014 11:22 AM Conley Simmonds, LPN Patient Calls      Created by     Earnestine Mealing on 04/27/2014 11:18 AM

## 2014-04-29 NOTE — Telephone Encounter (Signed)
Pt notified per Elberta Leatherwood

## 2014-05-05 ENCOUNTER — Encounter: Payer: Self-pay | Admitting: Internal Medicine

## 2014-05-05 ENCOUNTER — Ambulatory Visit (AMBULATORY_SURGERY_CENTER): Payer: BLUE CROSS/BLUE SHIELD | Admitting: Internal Medicine

## 2014-05-05 VITALS — BP 110/73 | HR 45 | Temp 96.1°F | Resp 10 | Ht 70.5 in | Wt 186.0 lb

## 2014-05-05 DIAGNOSIS — D123 Benign neoplasm of transverse colon: Secondary | ICD-10-CM

## 2014-05-05 DIAGNOSIS — D128 Benign neoplasm of rectum: Secondary | ICD-10-CM

## 2014-05-05 DIAGNOSIS — D122 Benign neoplasm of ascending colon: Secondary | ICD-10-CM

## 2014-05-05 DIAGNOSIS — D129 Benign neoplasm of anus and anal canal: Secondary | ICD-10-CM

## 2014-05-05 DIAGNOSIS — Z1211 Encounter for screening for malignant neoplasm of colon: Secondary | ICD-10-CM

## 2014-05-05 MED ORDER — SODIUM CHLORIDE 0.9 % IV SOLN: 500.0000 mL | INTRAVENOUS | Status: DC

## 2014-05-05 NOTE — Patient Instructions (Addendum)
YOU HAD AN ENDOSCOPIC PROCEDURE TODAY AT [facility]: Refer to the procedure report that was given to you for any specific questions about what was found during the examination.  If the procedure report does not answer your questions, please call your gastroenterologist to clarify.  If you requested that your care partner not be given the details of your procedure findings, then the procedure report has been included in a sealed envelope for you to review at your convenience later.  YOU SHOULD EXPECT: Some feelings of bloating in the abdomen. Passage of more gas than usual.  Walking can help get rid of the air that was put into your GI tract during the procedure and reduce the bloating. If you had a lower endoscopy (such as a colonoscopy or flexible sigmoidoscopy) you may notice spotting of blood in your stool or on the toilet paper. If you underwent a bowel prep for your procedure, then you may not have a normal bowel movement for a few days.  DIET: Your first meal following the procedure should be a light meal and then it is ok to progress to your normal diet.  A half-sandwich or bowl of soup is an example of a good first meal.  Heavy or fried foods are harder to digest and may make you feel nauseous or bloated.  Likewise meals heavy in dairy and vegetables can cause extra gas to form and this can also increase the bloating.  Drink plenty of fluids but you should avoid alcoholic beverages for 24 hours.  ACTIVITY: Your care partner should take you home directly after the procedure.  You should plan to take it easy, moving slowly for the rest of the day.  You can resume normal activity the day after the procedure however you should NOT DRIVE or use heavy machinery for 24 hours (because of the sedation medicines used during the test).    SYMPTOMS TO REPORT IMMEDIATELY: A gastroenterologist can be reached at any hour.  During normal business hours, 8:30 AM to 5:00 PM Monday through  Friday, call [phone].  After hours and on weekends, please call the GI answering service at [phone] who will take a message and have the physician on call contact you.   Following lower endoscopy (colonoscopy or flexible sigmoidoscopy):  Excessive amounts of blood in the stool  Significant tenderness or worsening of abdominal pains  Swelling of the abdomen that is new, acute  Fever of 100F or higher  FOLLOW UP: If any biopsies were taken you will be contacted by phone or by letter within the next 1-3 weeks.  Call your gastroenterologist if you have not heard about the biopsies in 3 weeks.  Our staff will call the home number listed on your records the next business day following your procedure to check on you and address any questions or concerns that you may have at that time regarding the information given to you following your procedure. This is a courtesy call and so if there is no answer at the home number and we have not heard from you through the emergency physician on call, we will assume that you have returned to your regular daily activities without incident.  SIGNATURES/CONFIDENTIALITY: You and/or your care partner have signed paperwork which will be entered into your electronic medical record.  These signatures attest to the fact that that the information above on your After Visit Summary has been reviewed and is understood.  Full responsibility of the confidentiality  of this discharge information lies with you and/or your care-partner.  I found and removed 4 very small polyps that look benign. Prostate exam was normal.  I will let you know pathology results and when to have another routine colonoscopy by mail.  Please resume Lovenox (today is ok) and warfarin (today is ok) as directed by anti-coagulation clinic.  I appreciate the opportunity to care for you. Gatha Mayer, MD, [facility]  Recommendations Discharge instructions given to patient and/or care  partner. Polyp handout provided.

## 2014-05-05 NOTE — Op Note (Signed)
[place]  [address] [place], [id number]   COLONOSCOPY PROCEDURE REPORT  PATIENT: Robert [name], Robert [name]  MR#: [id number] BIRTHDATE: 1960-05-25 , 53  yrs. old GENDER: male ENDOSCOPIST: Gatha Mayer, MD, [facility] PROCEDURE DATE:  05/05/2014 PROCEDURE:   Colonoscopy with snare polypectomy First Screening Colonoscopy - Avg.  risk and is 50 yrs.  old or older Yes.  Prior Negative Screening - Now for repeat screening. N/A  History of Adenoma - Now for follow-up colonoscopy & has been > or = to 3 yrs.  N/A  Polyps Removed Today? Yes. ASA CLASS:   Class III INDICATIONS:average risk patient for colorectal cancer. MEDICATIONS: Propofol 200 mg IV and Monitored anesthesia care  DESCRIPTION OF PROCEDURE:   After the risks benefits and alternatives of the procedure were thoroughly explained, informed consent was obtained.  The digital rectal exam revealed no abnormalities of the rectum, revealed no prostatic nodules, and revealed the prostate was not enlarged.   The LB CF-H180AL Loaner [id number]  endoscope was introduced through the anus and advanced to the cecum, which was identified by both the appendix and ileocecal valve. No adverse events experienced.   The quality of the prep was excellent, using MiraLax  The instrument was then slowly withdrawn as the colon was fully examined.      COLON FINDINGS: 1) Four diminutive polyps removed by cold snare and sent to pathology.  One ascending, 2 transverse and one rectal polyp.2) Otherwise normal colonoscopy with excellent prep - first screening.  Retroflexed views revealed no abnormalities. The time to cecum=1 minutes 44 seconds.  Withdrawal time=11 minutes 23 seconds.  The scope was withdrawn and the procedure completed. COMPLICATIONS: There were no immediate complications.  ENDOSCOPIC IMPRESSION: 1) Four diminutive polyps removed by cold snare and sent to pathology. 2) Otherwise normal colonoscopy with excellent prep -  first screening  RECOMMENDATIONS: 1.  Timing of repeat colonoscopy will be determined by pathology findings. 2.  Resume Lovenox and warfarin as instructed  eSigned:  Gatha Mayer, MD, [facility] 05/05/2014 9:36 AM   cc: Jill Alexanders, MD and The Patient

## 2014-05-05 NOTE — Progress Notes (Signed)
Report to PACU, RN, vss, BBS= Clear.  

## 2014-05-05 NOTE — Progress Notes (Signed)
Called to room to assist during endoscopic procedure.  Patient ID and intended procedure confirmed with present staff. Received instructions for my participation in the procedure from the performing physician.  

## 2014-05-06 ENCOUNTER — Telehealth: Payer: Self-pay | Admitting: *Deleted

## 2014-05-06 NOTE — Telephone Encounter (Signed)
  Follow up Call-  Call back number 05/05/2014  Post procedure Call Back phone  # [phone]  Permission to leave phone message Yes     Patient questions:  Do you have a fever, pain , or abdominal swelling? No. Pain Score  0 *  Have you tolerated food without any problems? Yes.    Have you been able to return to your normal activities? Yes.    Do you have any questions about your discharge instructions: Diet   No. Medications  No. Follow up visit  No.  Do you have questions or concerns about your Care? No.  Actions: * If pain score is 4 or above: No action needed, pain <4.

## 2014-05-07 ENCOUNTER — Encounter: Payer: Self-pay | Admitting: Nurse Practitioner

## 2014-05-11 ENCOUNTER — Encounter: Payer: Self-pay | Admitting: Internal Medicine

## 2014-05-11 ENCOUNTER — Ambulatory Visit (INDEPENDENT_AMBULATORY_CARE_PROVIDER_SITE_OTHER): Payer: BLUE CROSS/BLUE SHIELD | Admitting: *Deleted

## 2014-05-11 DIAGNOSIS — Z860101 Personal history of adenomatous and serrated colon polyps: Secondary | ICD-10-CM | POA: Insufficient documentation

## 2014-05-11 DIAGNOSIS — Z86010 Personal history of colonic polyps: Secondary | ICD-10-CM

## 2014-05-11 DIAGNOSIS — I6350 Cerebral infarction due to unspecified occlusion or stenosis of unspecified cerebral artery: Secondary | ICD-10-CM

## 2014-05-11 DIAGNOSIS — I639 Cerebral infarction, unspecified: Secondary | ICD-10-CM

## 2014-05-11 DIAGNOSIS — Z7901 Long term (current) use of anticoagulants: Secondary | ICD-10-CM

## 2014-05-11 DIAGNOSIS — Z952 Presence of prosthetic heart valve: Secondary | ICD-10-CM

## 2014-05-11 DIAGNOSIS — Z954 Presence of other heart-valve replacement: Secondary | ICD-10-CM

## 2014-05-11 DIAGNOSIS — I359 Nonrheumatic aortic valve disorder, unspecified: Secondary | ICD-10-CM

## 2014-05-11 HISTORY — DX: Personal history of adenomatous and serrated colon polyps: Z86.0101

## 2014-05-11 HISTORY — DX: Personal history of colonic polyps: Z86.010

## 2014-05-11 LAB — POCT INR: INR: 1.9

## 2014-05-11 NOTE — Progress Notes (Signed)
Quick Note:  Four diminutive adenomas - repeat colonoscopy 2019 ______

## 2014-05-17 ENCOUNTER — Other Ambulatory Visit: Payer: Self-pay | Admitting: Cardiology

## 2014-05-18 ENCOUNTER — Ambulatory Visit (INDEPENDENT_AMBULATORY_CARE_PROVIDER_SITE_OTHER): Payer: BLUE CROSS/BLUE SHIELD | Admitting: *Deleted

## 2014-05-18 DIAGNOSIS — Z7901 Long term (current) use of anticoagulants: Secondary | ICD-10-CM

## 2014-05-18 DIAGNOSIS — I639 Cerebral infarction, unspecified: Secondary | ICD-10-CM

## 2014-05-18 DIAGNOSIS — I359 Nonrheumatic aortic valve disorder, unspecified: Secondary | ICD-10-CM

## 2014-05-18 DIAGNOSIS — Z954 Presence of other heart-valve replacement: Secondary | ICD-10-CM

## 2014-05-18 DIAGNOSIS — I6350 Cerebral infarction due to unspecified occlusion or stenosis of unspecified cerebral artery: Secondary | ICD-10-CM

## 2014-05-18 DIAGNOSIS — Z952 Presence of prosthetic heart valve: Secondary | ICD-10-CM

## 2014-05-18 LAB — POCT INR: INR: 2.9

## 2014-05-21 ENCOUNTER — Encounter: Payer: Self-pay | Admitting: Medical

## 2014-05-21 ENCOUNTER — Ambulatory Visit (INDEPENDENT_AMBULATORY_CARE_PROVIDER_SITE_OTHER): Payer: BLUE CROSS/BLUE SHIELD | Admitting: Medical

## 2014-05-21 VITALS — BP 100/60 | HR 71 | Temp 97.7°F | Wt 182.0 lb

## 2014-05-21 DIAGNOSIS — R21 Rash and other nonspecific skin eruption: Secondary | ICD-10-CM | POA: Diagnosis not present

## 2014-05-21 DIAGNOSIS — L299 Pruritus, unspecified: Secondary | ICD-10-CM | POA: Diagnosis not present

## 2014-05-21 DIAGNOSIS — Z79899 Other long term (current) drug therapy: Secondary | ICD-10-CM

## 2014-05-21 MED ORDER — METHYLPREDNISOLONE (PAK) 4 MG PO TABS: ORAL_TABLET | ORAL | Status: DC

## 2014-05-21 MED ORDER — METHYLPREDNISOLONE ACETATE 80 MG/ML IJ SUSP
80.0000 mg | Freq: Once | INTRAMUSCULAR | Status: AC
  Administered 2014-05-21: 80 mg via INTRAMUSCULAR

## 2014-05-21 NOTE — Addendum Note (Signed)
Addended by: Armanda Magic on: 05/21/2014 02:50 PM   Modules accepted: Orders

## 2014-05-21 NOTE — Progress Notes (Signed)
Subjective: Here for rash.  He notes the day before yesterday he was [place] farm.  Took a sick rooster out, cut his head off an buried the chicken.   He had been out in his yard few days before.  He had not been handling the chicken prior.  After he carried the chicken he felt like something was crawling on his left face.   Since then has had itching throughout his body, skin a little flushed. He does take Coumadin. The only other change was that he took a prophylactic dose of amoxicillin for dental work Wednesday 2 days ago but no other amoxicillin no other new changes. Otherwise in usual state of health. No fever no sweats no belly pain and nausea no vomiting no diarrhea and no other symptoms. No other aggravating or relieving factors. No other complaint.  ROS as in subjective  Past Medical History  Diagnosis Date  . DVT (deep venous thrombosis)   . S/P aortic valve replacement   . Subacute bacterial endocarditis (SBE)   . Migraine headache   . Renal stone   . CVA (cerebral infarction)   . Aneurysm of ascending aorta   . Stroke   . Blood transfusion without reported diagnosis   . Cataract     BEGINNING  . Hyperlipidemia   . Hx of adenomatous colonic polyps 05/11/2014     Objective: BP 100/60 mmHg  Pulse 71  Temp(Src) 97.7 F (36.5 C) (Oral)  Wt 182 lb (82.555 kg) Gen: wd, wn, nad Skin:patch of pink/red flat area bilat supraspinatus area where suspenders touch, small similar patch of erythema at belt line behind where belt buckle would be located although he has no belt, skin seems faintly flushed/reticular in general, but no other distinct rash or urticaria   Assessment: Encounter Diagnoses  Name Primary?  . Pruritic dermatitis Yes  . Rash and nonspecific skin eruption   . High risk medication use    Plan: The possible trigger to this hypersensitivity reaction may be the single prophylactic dose of amoxicillin he had 2 days ago versus other random  exposure that we haven't pinpointed. No apparent sign of skin infection, he is not ill.  At this point begin Benadryl over-the-counter 2-3 times daily, Depo-Medrol 80 mg IM today in the office. In the event he gets much worse over the weekend he can begin Medrol Dosepak. However if any new symptom or change such as fever nausea vomiting filling ill, then recheck.

## 2014-06-15 ENCOUNTER — Ambulatory Visit (INDEPENDENT_AMBULATORY_CARE_PROVIDER_SITE_OTHER): Payer: BLUE CROSS/BLUE SHIELD | Admitting: Pharmacist

## 2014-06-15 DIAGNOSIS — I6930 Unspecified sequelae of cerebral infarction: Secondary | ICD-10-CM

## 2014-06-15 DIAGNOSIS — I359 Nonrheumatic aortic valve disorder, unspecified: Secondary | ICD-10-CM

## 2014-06-15 DIAGNOSIS — I639 Cerebral infarction, unspecified: Secondary | ICD-10-CM | POA: Diagnosis not present

## 2014-06-15 DIAGNOSIS — Z7901 Long term (current) use of anticoagulants: Secondary | ICD-10-CM

## 2014-06-15 DIAGNOSIS — I6350 Cerebral infarction due to unspecified occlusion or stenosis of unspecified cerebral artery: Secondary | ICD-10-CM

## 2014-06-15 DIAGNOSIS — Z954 Presence of other heart-valve replacement: Secondary | ICD-10-CM

## 2014-06-15 DIAGNOSIS — Z952 Presence of prosthetic heart valve: Secondary | ICD-10-CM

## 2014-06-15 LAB — POCT INR: INR: 2.8

## 2014-07-05 ENCOUNTER — Encounter: Payer: Self-pay | Admitting: Cardiology

## 2014-07-05 ENCOUNTER — Ambulatory Visit (INDEPENDENT_AMBULATORY_CARE_PROVIDER_SITE_OTHER): Payer: BLUE CROSS/BLUE SHIELD | Admitting: *Deleted

## 2014-07-05 ENCOUNTER — Ambulatory Visit (INDEPENDENT_AMBULATORY_CARE_PROVIDER_SITE_OTHER): Payer: BLUE CROSS/BLUE SHIELD | Admitting: Cardiology

## 2014-07-05 VITALS — BP 106/68 | HR 58 | Ht 70.5 in | Wt 183.4 lb

## 2014-07-05 DIAGNOSIS — Z8774 Personal history of (corrected) congenital malformations of heart and circulatory system: Secondary | ICD-10-CM | POA: Diagnosis not present

## 2014-07-05 DIAGNOSIS — I639 Cerebral infarction, unspecified: Secondary | ICD-10-CM

## 2014-07-05 DIAGNOSIS — Z954 Presence of other heart-valve replacement: Secondary | ICD-10-CM | POA: Diagnosis not present

## 2014-07-05 DIAGNOSIS — Z7901 Long term (current) use of anticoagulants: Secondary | ICD-10-CM

## 2014-07-05 DIAGNOSIS — Z952 Presence of prosthetic heart valve: Secondary | ICD-10-CM

## 2014-07-05 DIAGNOSIS — I6350 Cerebral infarction due to unspecified occlusion or stenosis of unspecified cerebral artery: Secondary | ICD-10-CM

## 2014-07-05 DIAGNOSIS — I359 Nonrheumatic aortic valve disorder, unspecified: Secondary | ICD-10-CM | POA: Diagnosis not present

## 2014-07-05 DIAGNOSIS — I6930 Unspecified sequelae of cerebral infarction: Secondary | ICD-10-CM

## 2014-07-05 LAB — POCT INR: INR: 2.2

## 2014-07-05 NOTE — Progress Notes (Signed)
Patient ID: Robert [name], male   DOB: 1960/12/04, 54 y.o.   MRN: [id number]    Patient Name: Robert [name] Date of Encounter: 07/05/2014  Primary Care Provider:  Wyatt Haste, MD Primary Cardiologist:  Dorothy Spark [facility] Dr. Verl Blalock)  Problem List   Past Medical History  Diagnosis Date  . DVT (deep venous thrombosis)   . S/P aortic valve replacement   . Subacute bacterial endocarditis (SBE)   . Migraine headache   . Renal stone   . CVA (cerebral infarction)   . Aneurysm of ascending aorta   . Stroke   . Blood transfusion without reported diagnosis   . Cataract     BEGINNING  . Hyperlipidemia   . Hx of adenomatous colonic polyps 05/11/2014   Past Surgical History  Procedure Laterality Date  . Cardiac catheterization  01/2005  . Aortic valve replacement  2007    Allergies  Allergies  Allergen Reactions  . Amoxicillin     Possible allergy, itching and faint rash 3 days after single prophylactic dose at dentist    HPI  54 year old gentleman with prior medical history of bicuspid aortic valve and dilated ascending aorta that underwent aortic valve and root replacement in 2007. 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  The patient used to be very active she works as a Land, and used to [employer]. 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   He denies any shortness of breath, chest pain, palpitations, lower extremity edema or syncope. He's very compliant with his Coumadin. Denies any bleeding, no palpitations or syncope.    Home Medications  Prior to Admission medications   Medication Sig Start Date End Date Taking? Authorizing Provider  gabapentin (NEURONTIN) 800 MG tablet  Take 1 tablet (800 mg total) by mouth 2 (two) times daily. 04/29/13  Yes Dennie Bible, NP  warfarin (COUMADIN) 5 MG tablet 6.25 mg alternate with 5 mg 02/09/13  Yes Dorothy Spark, MD    Family History  Family History  Problem Relation Age of Onset  . CVA Mother 57    hemorrhagic cva  . Colon cancer Paternal Grandfather   . Colon cancer Paternal Grandmother   . Diabetes Neg Hx   . Kidney disease Neg Hx   . Esophageal cancer Neg Hx   . Gallbladder disease Neg Hx   . Heart disease Neg Hx     Social History  History   Social History  . Marital Status: Married    Spouse Name: [name]  . Number of Children: 8  . Years of Education: BSEE   Occupational History  . Oncologist   Social History Main Topics  . Smoking status: Never Smoker   . Smokeless tobacco: Never Used  . Alcohol Use: 1.2 oz/week    2 Standard drinks or equivalent per week     Comment: Drinks 1 beer every 4 days  . Drug Use: No  . Sexual Activity: Not on file   Other Topics Concern  . Not on file   Social History Narrative   Patient lives at home with his wife [name].    Patient has 8 children. Boys   Patient is an  Art gallery manager.          Review of Systems, as per HPI, otherwise negative General:  No chills, fever, night sweats or weight changes.  Cardiovascular:  No chest pain, dyspnea on exertion, edema, orthopnea, palpitations, paroxysmal nocturnal dyspnea. Dermatological: No rash, lesions/masses Respiratory: No cough, dyspnea Urologic: No hematuria, dysuria Abdominal:   No nausea, vomiting, diarrhea, bright red blood per rectum, melena, or hematemesis Neurologic:  No visual changes, wkns, changes in mental status. All other systems reviewed and are otherwise negative except as noted above.  Physical Exam  Blood pressure 106/68, pulse 58, height 5' 10.5" (1.791 m), weight 183 lb 6.4 oz (83.19 kg).  General: Pleasant, NAD Psych:  Normal affect. Neuro: Alert and oriented X 3. Moves all extremities spontaneously. HEENT: Normal  Neck: Supple without bruits or JVD. Lungs:  Resp regular and unlabored, CTA. Heart: RRR no s3, s4, or murmurs. Loud clicks of mechanical valve. Abdomen: Soft, non-tender, non-distended, BS + x 4.  Extremities: No clubbing, cyanosis or edema. DP/PT/Radials 2+ and equal bilaterally.  Labs:  No results for input(s): CKTOTAL, CKMB, TROPONINI in the last 72 hours. Lab Results  Component Value Date   WBC 7.1 07/03/2013   HGB 15.2 07/03/2013   HCT 45.5 07/03/2013   MCV 88.6 07/03/2013   PLT 277.0 07/03/2013    Lab Results  Component Value Date   DDIMER * 06/07/2008    0.76        AT THE INHOUSE ESTABLISHED CUTOFF VALUE OF 0.48 ug/mL FEU, THIS ASSAY HAS BEEN DOCUMENTED IN THE LITERATURE TO HAVE A SENSITIVITY AND NEGATIVE PREDICTIVE VALUE OF AT LEAST 98 TO 99%.  THE TEST RESULT SHOULD BE CORRELATED WITH AN ASSESSMENT OF THE CLINICAL PROBABILITY OF DVT / VTE.   Invalid input(s): POCBNP    Component Value Date/Time   NA 138 03/02/2014 0800   K 4.1 03/02/2014 0800   CL 105 03/02/2014 0800   CO2 28 03/02/2014 0800   GLUCOSE 89 03/02/2014 0800   BUN 23 03/02/2014 0800   CREATININE 0.8 03/02/2014 0800   CALCIUM 8.7 03/02/2014 0800   PROT 6.5 03/02/2014 0800   ALBUMIN 4.0 03/02/2014 0800   AST 28 03/02/2014 0800   ALT 27 03/02/2014 0800   ALKPHOS 62 03/02/2014 0800   BILITOT 1.5* 03/02/2014 0800   GFRNONAA 96.10 08/13/2008 0000   GFRAA  07/10/2008 1250    >60        The eGFR has been calculated using the MDRD equation. This calculation has not been validated in all clinical situations. eGFR's persistently <60 mL/min signify possible Chronic Kidney Disease.   Lab Results  Component Value Date   CHOL 151 03/02/2014   HDL 50.20 03/02/2014   LDLCALC 85 03/02/2014   TRIG 79.0 03/02/2014   Accessory Clinical Findings  Echocardiogram 2015  - Left ventricle: The cavity  size was normal. There was mild focal basal hypertrophy of the septum. Systolic function was normal. The estimated ejection fraction was in the range of 60% to 65%. Wall motion was normal; there were no regional wall motion abnormalities. Features are consistent with a pseudonormal left ventricular filling pattern, with concomitant abnormal relaxation and increased filling pressure (grade 2 diastolic dysfunction).  - Aortic valve: A bioprosthesis was present. Trivial regurgitation. - Aortic root: The aortic root was mildly dilated. Impressions:  - Compared to 06/08/08, LV function remains normal; bioprosthetic aortic valve not well visualized but gradients are normal; if clinically indicated, TEE would have greater sensitivity for SOE.  ECG - normal sinus rhythm, 65 beats per minute normal EKG, new 1.AVB    Assessment & Plan  A pleasant 54 year old gentleman  1. History of bicuspid aortic valve and aortic root dilatation status post AVR and aortic root replacement in 2007 and aVR with mechanical bicuspid valve in 2010 for infection complicated by embolic events. Patient has chronic Coumadin and he is very compliant. Her last echocardiogram in 2015 shows well functioning AV prosthesis with normal transaortic gradients.   2. Blood pressure - controlled.  3. Lipids - all at goal in 02/2014.  The patient will followup in one year and he was advised what type of exercise he is allowed to cc encouraged to do a cardio exercise but avoid isometric exercise with his mechanical valve.   Dorothy Spark, MD, [facility] 07/05/2014, 9:36 AM

## 2014-07-05 NOTE — Patient Instructions (Signed)
Your physician recommends that you continue on your current medications as directed. Please refer to the Current Medication list given to you today.  Your physician wants you to follow-up in: 1 year with Dr. [name]. You will receive a reminder letter in the mail two months in advance. If you don't receive a letter, please call our office to schedule the follow-up appointment.

## 2014-07-26 ENCOUNTER — Ambulatory Visit (INDEPENDENT_AMBULATORY_CARE_PROVIDER_SITE_OTHER): Payer: BLUE CROSS/BLUE SHIELD | Admitting: *Deleted

## 2014-07-26 ENCOUNTER — Other Ambulatory Visit: Payer: Self-pay

## 2014-07-26 DIAGNOSIS — I359 Nonrheumatic aortic valve disorder, unspecified: Secondary | ICD-10-CM | POA: Diagnosis not present

## 2014-07-26 DIAGNOSIS — E785 Hyperlipidemia, unspecified: Secondary | ICD-10-CM

## 2014-07-26 DIAGNOSIS — Z952 Presence of prosthetic heart valve: Secondary | ICD-10-CM

## 2014-07-26 DIAGNOSIS — Z7901 Long term (current) use of anticoagulants: Secondary | ICD-10-CM | POA: Diagnosis not present

## 2014-07-26 DIAGNOSIS — I6350 Cerebral infarction due to unspecified occlusion or stenosis of unspecified cerebral artery: Secondary | ICD-10-CM

## 2014-07-26 DIAGNOSIS — I639 Cerebral infarction, unspecified: Secondary | ICD-10-CM

## 2014-07-26 DIAGNOSIS — Z954 Presence of other heart-valve replacement: Secondary | ICD-10-CM | POA: Diagnosis not present

## 2014-07-26 DIAGNOSIS — I6930 Unspecified sequelae of cerebral infarction: Secondary | ICD-10-CM

## 2014-07-26 LAB — POCT INR: INR: 1.7

## 2014-07-26 MED ORDER — ATORVASTATIN CALCIUM 10 MG PO TABS: 10.0000 mg | ORAL_TABLET | Freq: Every day | ORAL | Status: DC

## 2014-07-27 ENCOUNTER — Telehealth: Payer: Self-pay | Admitting: Family Medicine

## 2014-07-27 MED ORDER — GABAPENTIN 800 MG PO TABS: 800.0000 mg | ORAL_TABLET | Freq: Two times a day (BID) | ORAL | Status: DC

## 2014-07-27 NOTE — Telephone Encounter (Addendum)
Requesting refill on Gabapentin 800mg . Pt says he discussed Dr Redmond School refilling this med at his last appt with Dr Redmond School. Send [facility]

## 2014-08-09 ENCOUNTER — Ambulatory Visit (INDEPENDENT_AMBULATORY_CARE_PROVIDER_SITE_OTHER): Payer: BLUE CROSS/BLUE SHIELD | Admitting: *Deleted

## 2014-08-09 ENCOUNTER — Other Ambulatory Visit: Payer: Self-pay | Admitting: *Deleted

## 2014-08-09 DIAGNOSIS — I6350 Cerebral infarction due to unspecified occlusion or stenosis of unspecified cerebral artery: Secondary | ICD-10-CM

## 2014-08-09 DIAGNOSIS — Z7901 Long term (current) use of anticoagulants: Secondary | ICD-10-CM

## 2014-08-09 DIAGNOSIS — E785 Hyperlipidemia, unspecified: Secondary | ICD-10-CM

## 2014-08-09 DIAGNOSIS — I639 Cerebral infarction, unspecified: Secondary | ICD-10-CM | POA: Diagnosis not present

## 2014-08-09 DIAGNOSIS — Z954 Presence of other heart-valve replacement: Secondary | ICD-10-CM | POA: Diagnosis not present

## 2014-08-09 DIAGNOSIS — I359 Nonrheumatic aortic valve disorder, unspecified: Secondary | ICD-10-CM | POA: Diagnosis not present

## 2014-08-09 DIAGNOSIS — I6930 Unspecified sequelae of cerebral infarction: Secondary | ICD-10-CM | POA: Diagnosis not present

## 2014-08-09 DIAGNOSIS — Z952 Presence of prosthetic heart valve: Secondary | ICD-10-CM

## 2014-08-09 LAB — POCT INR: INR: 3.2

## 2014-08-09 MED ORDER — ATORVASTATIN CALCIUM 10 MG PO TABS: 10.0000 mg | ORAL_TABLET | Freq: Every day | ORAL | Status: DC

## 2014-08-30 ENCOUNTER — Ambulatory Visit (INDEPENDENT_AMBULATORY_CARE_PROVIDER_SITE_OTHER): Payer: BLUE CROSS/BLUE SHIELD

## 2014-08-30 DIAGNOSIS — I359 Nonrheumatic aortic valve disorder, unspecified: Secondary | ICD-10-CM

## 2014-08-30 DIAGNOSIS — Z954 Presence of other heart-valve replacement: Secondary | ICD-10-CM

## 2014-08-30 DIAGNOSIS — I639 Cerebral infarction, unspecified: Secondary | ICD-10-CM

## 2014-08-30 DIAGNOSIS — I6930 Unspecified sequelae of cerebral infarction: Secondary | ICD-10-CM | POA: Diagnosis not present

## 2014-08-30 DIAGNOSIS — Z7901 Long term (current) use of anticoagulants: Secondary | ICD-10-CM

## 2014-08-30 DIAGNOSIS — Z952 Presence of prosthetic heart valve: Secondary | ICD-10-CM

## 2014-08-30 DIAGNOSIS — I6350 Cerebral infarction due to unspecified occlusion or stenosis of unspecified cerebral artery: Secondary | ICD-10-CM

## 2014-08-30 LAB — POCT INR: INR: 3

## 2014-09-29 ENCOUNTER — Ambulatory Visit (INDEPENDENT_AMBULATORY_CARE_PROVIDER_SITE_OTHER): Payer: BLUE CROSS/BLUE SHIELD | Admitting: *Deleted

## 2014-09-29 DIAGNOSIS — Z954 Presence of other heart-valve replacement: Secondary | ICD-10-CM

## 2014-09-29 DIAGNOSIS — I6930 Unspecified sequelae of cerebral infarction: Secondary | ICD-10-CM | POA: Diagnosis not present

## 2014-09-29 DIAGNOSIS — I639 Cerebral infarction, unspecified: Secondary | ICD-10-CM | POA: Diagnosis not present

## 2014-09-29 DIAGNOSIS — Z7901 Long term (current) use of anticoagulants: Secondary | ICD-10-CM

## 2014-09-29 DIAGNOSIS — Z952 Presence of prosthetic heart valve: Secondary | ICD-10-CM

## 2014-09-29 DIAGNOSIS — I359 Nonrheumatic aortic valve disorder, unspecified: Secondary | ICD-10-CM | POA: Diagnosis not present

## 2014-09-29 DIAGNOSIS — I6350 Cerebral infarction due to unspecified occlusion or stenosis of unspecified cerebral artery: Secondary | ICD-10-CM

## 2014-09-29 LAB — POCT INR: INR: 2.8

## 2014-11-11 ENCOUNTER — Other Ambulatory Visit: Payer: Self-pay | Admitting: Cardiology

## 2015-01-22 ENCOUNTER — Other Ambulatory Visit: Payer: Self-pay | Admitting: Cardiology

## 2015-02-10 ENCOUNTER — Other Ambulatory Visit: Payer: Self-pay | Admitting: Cardiology

## 2015-02-10 DIAGNOSIS — E785 Hyperlipidemia, unspecified: Secondary | ICD-10-CM

## 2015-02-10 MED ORDER — ATORVASTATIN CALCIUM 10 MG PO TABS: 10.0000 mg | ORAL_TABLET | Freq: Every day | ORAL | Status: DC

## 2015-02-11 ENCOUNTER — Telehealth: Payer: Self-pay

## 2015-02-11 NOTE — Telephone Encounter (Signed)
Refill request for Gabapentin 800mg  #90 Refills: 4

## 2015-02-12 MED ORDER — GABAPENTIN 800 MG PO TABS: 800.0000 mg | ORAL_TABLET | Freq: Two times a day (BID) | ORAL | Status: DC

## 2015-02-12 NOTE — Telephone Encounter (Signed)
He needs an appointment for med check

## 2015-02-15 NOTE — Telephone Encounter (Signed)
Pt has appointment 02/23/15

## 2015-02-23 ENCOUNTER — Ambulatory Visit (INDEPENDENT_AMBULATORY_CARE_PROVIDER_SITE_OTHER): Payer: BLUE CROSS/BLUE SHIELD | Admitting: Family Medicine

## 2015-02-23 ENCOUNTER — Encounter: Payer: Self-pay | Admitting: Family Medicine

## 2015-02-23 VITALS — BP 100/60 | HR 70 | Ht 70.5 in | Wt 193.0 lb

## 2015-02-23 DIAGNOSIS — I6930 Unspecified sequelae of cerebral infarction: Secondary | ICD-10-CM

## 2015-02-23 DIAGNOSIS — G43009 Migraine without aura, not intractable, without status migrainosus: Secondary | ICD-10-CM

## 2015-02-23 DIAGNOSIS — Z87442 Personal history of urinary calculi: Secondary | ICD-10-CM

## 2015-02-23 DIAGNOSIS — Z79899 Other long term (current) drug therapy: Secondary | ICD-10-CM

## 2015-02-23 DIAGNOSIS — E785 Hyperlipidemia, unspecified: Secondary | ICD-10-CM | POA: Diagnosis not present

## 2015-02-23 DIAGNOSIS — H269 Unspecified cataract: Secondary | ICD-10-CM

## 2015-02-23 DIAGNOSIS — Z1159 Encounter for screening for other viral diseases: Secondary | ICD-10-CM | POA: Diagnosis not present

## 2015-02-23 DIAGNOSIS — Z23 Encounter for immunization: Secondary | ICD-10-CM | POA: Diagnosis not present

## 2015-02-23 DIAGNOSIS — Z954 Presence of other heart-valve replacement: Secondary | ICD-10-CM

## 2015-02-23 DIAGNOSIS — Z86010 Personal history of colonic polyps: Secondary | ICD-10-CM

## 2015-02-23 DIAGNOSIS — Z7901 Long term (current) use of anticoagulants: Secondary | ICD-10-CM

## 2015-02-23 DIAGNOSIS — Z952 Presence of prosthetic heart valve: Secondary | ICD-10-CM

## 2015-02-23 LAB — LIPID PANEL
Cholesterol: 154 mg/dL (ref 125–200)
HDL: 55 mg/dL (ref >=40)
LDL CALC: 83 mg/dL (ref <130)
Total CHOL/HDL Ratio: 2.8 Ratio (ref <=5.0)
Triglycerides: 82 mg/dL (ref <150)
VLDL: 16 mg/dL (ref <30)

## 2015-02-23 LAB — COMPREHENSIVE METABOLIC PANEL
ALT: 32 U/L (ref 9–46)
AST: 29 U/L (ref 10–35)
Albumin: 4.1 g/dL (ref 3.6–5.1)
Alkaline Phosphatase: 60 U/L (ref 40–115)
BUN: 17 mg/dL (ref 7–25)
CALCIUM: 9.2 mg/dL (ref 8.6–10.3)
CO2: 26 mmol/L (ref 20–31)
CREATININE: 0.75 mg/dL (ref 0.70–1.33)
Chloride: 103 mmol/L (ref 98–110)
GLUCOSE: 82 mg/dL (ref 65–99)
Potassium: 4.5 mmol/L (ref 3.5–5.3)
SODIUM: 138 mmol/L (ref 135–146)
Total Bilirubin: 1.4 mg/dL — ABNORMAL HIGH (ref 0.2–1.2)
Total Protein: 6.6 g/dL (ref 6.1–8.1)

## 2015-02-23 LAB — CBC WITH DIFFERENTIAL/PLATELET
Basophils Absolute: 0 10*3/uL (ref 0.0–0.1)
Basophils Relative: 0 % (ref 0–1)
EOS ABS: 0 10*3/uL (ref 0.0–0.7)
EOS PCT: 1 % (ref 0–5)
HEMATOCRIT: 46.2 % (ref 39.0–52.0)
Hemoglobin: 15.7 g/dL (ref 13.0–17.0)
LYMPHS ABS: 2.1 10*3/uL (ref 0.7–4.0)
LYMPHS PCT: 44 % (ref 12–46)
MCH: 29.5 pg (ref 26.0–34.0)
MCHC: 34 g/dL (ref 30.0–36.0)
MCV: 86.8 fL (ref 78.0–100.0)
MONOS PCT: 9 % (ref 3–12)
MPV: 9.3 fL (ref 8.6–12.4)
Monocytes Absolute: 0.4 10*3/uL (ref 0.1–1.0)
Neutro Abs: 2.2 10*3/uL (ref 1.7–7.7)
Neutrophils Relative %: 46 % (ref 43–77)
Platelets: 253 10*3/uL (ref 150–400)
RBC: 5.32 MIL/uL (ref 4.22–5.81)
RDW: 14 % (ref 11.5–15.5)
WBC: 4.7 10*3/uL (ref 4.0–10.5)

## 2015-02-23 MED ORDER — GABAPENTIN 800 MG PO TABS: 800.0000 mg | ORAL_TABLET | Freq: Two times a day (BID) | ORAL | Status: DC

## 2015-02-23 MED ORDER — ATORVASTATIN CALCIUM 10 MG PO TABS: 10.0000 mg | ORAL_TABLET | Freq: Every day | ORAL | Status: DC

## 2015-02-23 NOTE — Progress Notes (Signed)
Subjective:    Patient ID: Robert [name], male    DOB: 1960/07/05, 54 y.o.   MRN: [id number]  HPI He is here for a interval evaluation. He does have a history of aortic valve replacement and presently is on Coumadin. He is being monitored for this by cardiology. His last numbers look pretty good. He also recently had a colonoscopy which did show polyps. He has a previous history of CVA. He is scheduled for repeat in about 3 years. He does have a history of migraine headaches and is doing quite well with gabapentin. He does have an aura but rarely does it go to full blown headache. He has a history of cataracts and is being followed by ophthalmology for this. Continues on Lipitor for his hyperlipidemia. He has a remote history of renal stone but no symptoms recently. His work is going well. He has 3 children from his previous marriage and 5 in his present marriage. He is not complaining of chest pain, shortness of breath, abdominal issues. His physical activity has not been upped his normal standards and he has gained some weight. Social and family history as well as health maintenance and immunizations were reviewed.  Review of Systems  All other systems reviewed and are negative.      Objective:   Physical Exam BP 100/60 mmHg  Pulse 70  Ht 5' 10.5" (1.791 m)  Wt 193 lb (87.544 kg)  BMI 27.29 kg/m2  General Appearance:    Alert, cooperative, no distress, appears stated age  Head:    Normocephalic, without obvious abnormality, atraumatic  Eyes:    PERRL, conjunctiva/corneas clear, EOM's intact, fundi    benign  Ears:    Normal TM's and external ear canals  Nose:   Nares normal, mucosa normal, no drainage or sinus   tenderness  Throat:   Lips, mucosa, and tongue normal; teeth and gums normal  Neck:   Supple, no lymphadenopathy;  thyroid:  no   enlargement/tenderness/nodules; no carotid   bruit or JVD  Back:    Spine nontender, no curvature, ROM normal, no CVA     tenderness  Lungs:      Clear to auscultation bilaterally without wheezes, rales or     ronchi; respirations unlabored  Chest Wall:    No tenderness or deformity   Heart:    Regular rate and rhythm, S1 and S2 normal, no murmur, rub   or gallop  Breast Exam:    No chest wall tenderness, masses or gynecomastia  Abdomen:     Soft, non-tender, nondistended, normoactive bowel sounds,    no masses, no hepatosplenomegaly        Extremities:   No clubbing, cyanosis or edema  Pulses:   2+ and symmetric all extremities  Skin:   Skin color, texture, turgor normal, no rashes or lesions  Lymph nodes:   Cervical, supraclavicular, and axillary nodes normal  Neurologic:   CNII-XII intact, normal strength, sensation and gait; reflexes 2+ and symmetric throughout          Psych:   Normal mood, affect, hygiene and grooming.          Assessment & Plan:  History of aortic valve replacement - Plan: CBC with Differential/Platelet, Comprehensive metabolic panel, Lipid panel  High risk medication use - Plan: CBC with Differential/Platelet, Comprehensive metabolic panel  Hx of adenomatous colonic polyps  Hyperlipidemia LDL goal <100 - Plan: Lipid panel  Long term current use of anticoagulant therapy  Migraine without  aura and without status migrainosus, not intractable - Plan: gabapentin (NEURONTIN) 800 MG tablet  History of renal stone - Plan: Comprehensive metabolic panel  Cataract  Hyperlipidemia - Plan: Lipid panel, atorvastatin (LIPITOR) 10 MG tablet  Need for hepatitis C screening test - Plan: Hepatitis C antibody  Need for prophylactic vaccination and inoculation against influenza - Plan: Flu Vaccine QUAD 36+ mos IM  History of CVA with residual deficit  I encouraged him to become more physically active. His medications were reviewed. Life in general seems be going quite well for him. He will continue to be followed by cardiology, ophthalmology.

## 2015-02-24 LAB — HEPATITIS C ANTIBODY: HCV Ab: NEGATIVE

## 2015-04-23 ENCOUNTER — Other Ambulatory Visit: Payer: Self-pay | Admitting: Cardiology

## 2015-04-26 ENCOUNTER — Ambulatory Visit (INDEPENDENT_AMBULATORY_CARE_PROVIDER_SITE_OTHER): Payer: 59 | Admitting: *Deleted

## 2015-04-26 DIAGNOSIS — Z7901 Long term (current) use of anticoagulants: Secondary | ICD-10-CM | POA: Diagnosis not present

## 2015-04-26 DIAGNOSIS — Z954 Presence of other heart-valve replacement: Secondary | ICD-10-CM | POA: Diagnosis not present

## 2015-04-26 DIAGNOSIS — I6350 Cerebral infarction due to unspecified occlusion or stenosis of unspecified cerebral artery: Secondary | ICD-10-CM | POA: Diagnosis not present

## 2015-04-26 DIAGNOSIS — I359 Nonrheumatic aortic valve disorder, unspecified: Secondary | ICD-10-CM

## 2015-04-26 DIAGNOSIS — I6930 Unspecified sequelae of cerebral infarction: Secondary | ICD-10-CM | POA: Diagnosis not present

## 2015-04-26 DIAGNOSIS — Z952 Presence of prosthetic heart valve: Secondary | ICD-10-CM

## 2015-04-26 LAB — POCT INR: INR: 2.9

## 2015-06-07 ENCOUNTER — Ambulatory Visit (INDEPENDENT_AMBULATORY_CARE_PROVIDER_SITE_OTHER): Payer: 59 | Admitting: *Deleted

## 2015-06-07 DIAGNOSIS — I6350 Cerebral infarction due to unspecified occlusion or stenosis of unspecified cerebral artery: Secondary | ICD-10-CM

## 2015-06-07 DIAGNOSIS — I359 Nonrheumatic aortic valve disorder, unspecified: Secondary | ICD-10-CM

## 2015-06-07 DIAGNOSIS — Z7901 Long term (current) use of anticoagulants: Secondary | ICD-10-CM | POA: Diagnosis not present

## 2015-06-07 DIAGNOSIS — I6930 Unspecified sequelae of cerebral infarction: Secondary | ICD-10-CM | POA: Diagnosis not present

## 2015-06-07 DIAGNOSIS — Z954 Presence of other heart-valve replacement: Secondary | ICD-10-CM | POA: Diagnosis not present

## 2015-06-07 DIAGNOSIS — Z952 Presence of prosthetic heart valve: Secondary | ICD-10-CM

## 2015-06-07 LAB — POCT INR: INR: 3.7

## 2015-07-15 ENCOUNTER — Ambulatory Visit (INDEPENDENT_AMBULATORY_CARE_PROVIDER_SITE_OTHER): Payer: 59 | Admitting: Cardiology

## 2015-07-15 ENCOUNTER — Ambulatory Visit (INDEPENDENT_AMBULATORY_CARE_PROVIDER_SITE_OTHER): Payer: 59 | Admitting: *Deleted

## 2015-07-15 ENCOUNTER — Encounter: Payer: Self-pay | Admitting: Cardiology

## 2015-07-15 VITALS — BP 100/70 | HR 65 | Ht 71.0 in | Wt 189.4 lb

## 2015-07-15 DIAGNOSIS — Z954 Presence of other heart-valve replacement: Secondary | ICD-10-CM

## 2015-07-15 DIAGNOSIS — I6930 Unspecified sequelae of cerebral infarction: Secondary | ICD-10-CM

## 2015-07-15 DIAGNOSIS — E785 Hyperlipidemia, unspecified: Secondary | ICD-10-CM | POA: Diagnosis not present

## 2015-07-15 DIAGNOSIS — Z7901 Long term (current) use of anticoagulants: Secondary | ICD-10-CM

## 2015-07-15 DIAGNOSIS — R079 Chest pain, unspecified: Secondary | ICD-10-CM

## 2015-07-15 DIAGNOSIS — I359 Nonrheumatic aortic valve disorder, unspecified: Secondary | ICD-10-CM

## 2015-07-15 DIAGNOSIS — I6350 Cerebral infarction due to unspecified occlusion or stenosis of unspecified cerebral artery: Secondary | ICD-10-CM | POA: Diagnosis not present

## 2015-07-15 DIAGNOSIS — Z952 Presence of prosthetic heart valve: Secondary | ICD-10-CM

## 2015-07-15 LAB — POCT INR: INR: 2.5

## 2015-07-15 NOTE — Progress Notes (Signed)
Patient ID: HAROUN COTHAM, male   DOB: 1961-02-09, 55 y.o.   MRN: [id number]    Patient Name: Robert [name] Date of Encounter: 07/15/2015  Primary Care Provider:  Wyatt Haste, MD Primary Cardiologist:  Dorothy Spark [facility] Dr. Verl Blalock)  Problem List   Past Medical History  Diagnosis Date  . DVT (deep venous thrombosis) ([place])   . S/P aortic valve replacement   . Subacute bacterial endocarditis (SBE) ([place])   . Migraine headache   . Renal stone   . CVA (cerebral infarction)   . Aneurysm of ascending aorta ([facility])   . Stroke ([place])   . Blood transfusion without reported diagnosis   . Cataract     BEGINNING  . Hyperlipidemia   . Hx of adenomatous colonic polyps 05/11/2014   Past Surgical History  Procedure Laterality Date  . Cardiac catheterization  01/2005  . Aortic valve replacement  2007    Allergies  Allergies  Allergen Reactions  . Amoxicillin     Possible allergy, itching and faint rash 3 days after single prophylactic dose at dentist    HPI  55 year old gentleman with prior medical history of bicuspid aortic valve and dilated ascending aorta that underwent aortic valve and root replacement in 2007. 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  The patient is coming after one year, he continues to be very active even though lately he slowed down. He denies any palpitations or syncope. He is very compliant with his Coumadin and denies any bleeding. He denies any orthopnea, paroxysmal nocturnal dyspnea. One episode of chest pain while he was arguing with his wife otherwise no exertional chest pain or dyspnea on exertion. No claudications or lower extremity edema.   Home Medications  Prior to Admission medications   Medication Sig Start Date End Date Taking? Authorizing Provider    gabapentin (NEURONTIN) 800 MG tablet Take 1 tablet (800 mg total) by mouth 2 (two) times daily. 04/29/13  Yes Dennie Bible, NP  warfarin (COUMADIN) 5 MG tablet 6.25 mg alternate with 5 mg 02/09/13  Yes Dorothy Spark, MD   Family History  Family History  Problem Relation Age of Onset  . CVA Mother 69    hemorrhagic cva  . Colon cancer Paternal Grandfather   . Colon cancer Paternal Grandmother   . Diabetes Neg Hx   . Kidney disease Neg Hx   . Esophageal cancer Neg Hx   . Gallbladder disease Neg Hx   . Heart disease Neg Hx     Social History  Social History   Social History  . Marital Status: Married    Spouse Name: [name]  . Number of Children: 8  . Years of Education: BSEE   Occupational History  . Oncologist   Social History Main Topics  . Smoking status: Never Smoker   . Smokeless tobacco: Never Used  . Alcohol Use: 1.2 oz/week    2 Standard drinks or equivalent per week     Comment: Drinks 1 beer every 4 days  . Drug Use: No  . Sexual Activity: Not on file   Other Topics Concern  . Not on file   Social History Narrative   Patient lives at home with his wife [name].    Patient has 8 children. Boys   Patient  is an Art gallery manager.          Review of Systems, as per HPI, otherwise negative General:  No chills, fever, night sweats or weight changes.  Cardiovascular:  No chest pain, dyspnea on exertion, edema, orthopnea, palpitations, paroxysmal nocturnal dyspnea. Dermatological: No rash, lesions/masses Respiratory: No cough, dyspnea Urologic: No hematuria, dysuria Abdominal:   No nausea, vomiting, diarrhea, bright red blood per rectum, melena, or hematemesis Neurologic:  No visual changes, wkns, changes in mental status. All other systems reviewed and are otherwise negative except as noted above.  Physical Exam  Blood pressure 100/70, pulse 65, height '5\' 11"'  (1.803 m), weight 189 lb 6.4 oz (85.911  kg).  General: Pleasant, NAD Psych: Normal affect. Neuro: Alert and oriented X 3. Moves all extremities spontaneously. HEENT: Normal  Neck: Supple without bruits or JVD. Lungs:  Resp regular and unlabored, CTA. Heart: RRR no s3, s4, or murmurs. Loud clicks of mechanical valve. Abdomen: Soft, non-tender, non-distended, BS + x 4.  Extremities: No clubbing, cyanosis or edema. DP/PT/Radials 2+ and equal bilaterally.  Labs:  No results for input(s): CKTOTAL, CKMB, TROPONINI in the last 72 hours. Lab Results  Component Value Date   WBC 4.7 02/23/2015   HGB 15.7 02/23/2015   HCT 46.2 02/23/2015   MCV 86.8 02/23/2015   PLT 253 02/23/2015    Lab Results  Component Value Date   DDIMER * 06/07/2008    0.76        AT THE INHOUSE ESTABLISHED CUTOFF VALUE OF 0.48 ug/mL FEU, THIS ASSAY HAS BEEN DOCUMENTED IN THE LITERATURE TO HAVE A SENSITIVITY AND NEGATIVE PREDICTIVE VALUE OF AT LEAST 98 TO 99%.  THE TEST RESULT SHOULD BE CORRELATED WITH AN ASSESSMENT OF THE CLINICAL PROBABILITY OF DVT / VTE.   Invalid input(s): POCBNP    Component Value Date/Time   NA 138 02/23/2015 0001   K 4.5 02/23/2015 0001   CL 103 02/23/2015 0001   CO2 26 02/23/2015 0001   GLUCOSE 82 02/23/2015 0001   BUN 17 02/23/2015 0001   CREATININE 0.75 02/23/2015 0001   CREATININE 0.8 03/02/2014 0800   CALCIUM 9.2 02/23/2015 0001   PROT 6.6 02/23/2015 0001   ALBUMIN 4.1 02/23/2015 0001   AST 29 02/23/2015 0001   ALT 32 02/23/2015 0001   ALKPHOS 60 02/23/2015 0001   BILITOT 1.4* 02/23/2015 0001   GFRNONAA 96.10 08/13/2008 0000   GFRAA  07/10/2008 1250    >60        The eGFR has been calculated using the MDRD equation. This calculation has not been validated in all clinical situations. eGFR's persistently <60 mL/min signify possible Chronic Kidney Disease.   Lab Results  Component Value Date   CHOL 154 02/23/2015   HDL 55 02/23/2015   LDLCALC 83 02/23/2015   TRIG 82 02/23/2015   Accessory  Clinical Findings  Echocardiogram 2015  - Left ventricle: The cavity size was normal. There was mild focal basal hypertrophy of the septum. Systolic function was normal. The estimated ejection fraction was in the range of 60% to 65%. Wall motion was normal; there were no regional wall motion abnormalities. Features are consistent with a pseudonormal left ventricular filling pattern, with concomitant abnormal relaxation and increased filling pressure (grade 2 diastolic dysfunction).  - Aortic valve: A bioprosthesis was present. Trivial regurgitation. - Aortic root: The aortic root was mildly dilated. Impressions:  - Compared to 06/08/08, LV function remains normal; bioprosthetic aortic valve not well visualized but gradients are normal; if clinically  indicated, TEE would have greater sensitivity for SOE.  ECG - normal sinus rhythm, 65 beats per minute normal EKG, new 1.AVB    Assessment & Plan  A pleasant 55 year old gentleman  1. History of bicuspid aortic valve and aortic root dilatation status post AVR and aortic root replacement in 2007 and aVR with mechanical bicuspid valve in 2010 for infection complicated by embolic events. Patient has chronic Coumadin and he is very compliant. Her last echocardiogram in 2015 shows well functioning AV prosthesis with normal transaortic gradients. No recent change in symptoms, no echo indicated. Continue warfarin, no bleeding Hb 15.7.  2. Blood pressure - controlled.  3. Lipids - all at goal in 12/2014 - continue atorvastatin 10 mg po daily.  Again, the patient is asking about proper exercise, encouraged to do a cardio exercise but avoid isometric exercise with his mechanical valve.  Dorothy Spark, MD, [facility] 07/15/2015, 9:04 AM

## 2015-07-15 NOTE — Patient Instructions (Signed)
Medication Instructions:  None  Labwork: None  Testing/Procedures: None  Follow-Up: Your physician wants you to follow-up in: 1 year with Dr [name]. You will receive a reminder letter in the mail two months in advance. If you don't receive a letter, please call our office to schedule the follow-up appointment.   Any Other Special Instructions Will Be Listed Below (If Applicable).     If you need a refill on your cardiac medications before your next appointment, please call your pharmacy.

## 2015-08-15 ENCOUNTER — Other Ambulatory Visit: Payer: Self-pay | Admitting: Cardiology

## 2015-09-15 ENCOUNTER — Other Ambulatory Visit: Payer: Self-pay | Admitting: Cardiology

## 2015-09-15 ENCOUNTER — Ambulatory Visit (INDEPENDENT_AMBULATORY_CARE_PROVIDER_SITE_OTHER): Payer: 59 | Admitting: *Deleted

## 2015-09-15 DIAGNOSIS — Z7901 Long term (current) use of anticoagulants: Secondary | ICD-10-CM | POA: Diagnosis not present

## 2015-09-15 DIAGNOSIS — I6350 Cerebral infarction due to unspecified occlusion or stenosis of unspecified cerebral artery: Secondary | ICD-10-CM | POA: Diagnosis not present

## 2015-09-15 DIAGNOSIS — I359 Nonrheumatic aortic valve disorder, unspecified: Secondary | ICD-10-CM | POA: Diagnosis not present

## 2015-09-15 DIAGNOSIS — I6930 Unspecified sequelae of cerebral infarction: Secondary | ICD-10-CM

## 2015-09-15 DIAGNOSIS — Z954 Presence of other heart-valve replacement: Secondary | ICD-10-CM

## 2015-09-15 DIAGNOSIS — Z952 Presence of prosthetic heart valve: Secondary | ICD-10-CM

## 2015-09-15 LAB — POCT INR: INR: 2.3

## 2015-10-27 ENCOUNTER — Ambulatory Visit (INDEPENDENT_AMBULATORY_CARE_PROVIDER_SITE_OTHER): Payer: 59

## 2015-10-27 DIAGNOSIS — I359 Nonrheumatic aortic valve disorder, unspecified: Secondary | ICD-10-CM

## 2015-10-27 DIAGNOSIS — I6350 Cerebral infarction due to unspecified occlusion or stenosis of unspecified cerebral artery: Secondary | ICD-10-CM | POA: Diagnosis not present

## 2015-10-27 DIAGNOSIS — Z7901 Long term (current) use of anticoagulants: Secondary | ICD-10-CM | POA: Diagnosis not present

## 2015-10-27 DIAGNOSIS — I6930 Unspecified sequelae of cerebral infarction: Secondary | ICD-10-CM

## 2015-10-27 DIAGNOSIS — Z954 Presence of other heart-valve replacement: Secondary | ICD-10-CM

## 2015-10-27 DIAGNOSIS — Z952 Presence of prosthetic heart valve: Secondary | ICD-10-CM

## 2015-10-27 LAB — POCT INR: INR: 2.1

## 2015-11-30 ENCOUNTER — Ambulatory Visit (INDEPENDENT_AMBULATORY_CARE_PROVIDER_SITE_OTHER): Payer: 59 | Admitting: *Deleted

## 2015-11-30 DIAGNOSIS — Z952 Presence of prosthetic heart valve: Secondary | ICD-10-CM

## 2015-11-30 DIAGNOSIS — Z954 Presence of other heart-valve replacement: Secondary | ICD-10-CM | POA: Diagnosis not present

## 2015-11-30 DIAGNOSIS — I6350 Cerebral infarction due to unspecified occlusion or stenosis of unspecified cerebral artery: Secondary | ICD-10-CM

## 2015-11-30 DIAGNOSIS — I359 Nonrheumatic aortic valve disorder, unspecified: Secondary | ICD-10-CM

## 2015-11-30 DIAGNOSIS — I6930 Unspecified sequelae of cerebral infarction: Secondary | ICD-10-CM

## 2015-11-30 DIAGNOSIS — Z7901 Long term (current) use of anticoagulants: Secondary | ICD-10-CM | POA: Diagnosis not present

## 2015-11-30 LAB — POCT INR: INR: 3.9

## 2015-12-06 ENCOUNTER — Other Ambulatory Visit: Payer: Self-pay | Admitting: Cardiology

## 2015-12-21 ENCOUNTER — Ambulatory Visit (INDEPENDENT_AMBULATORY_CARE_PROVIDER_SITE_OTHER): Payer: 59 | Admitting: *Deleted

## 2015-12-21 DIAGNOSIS — Z7901 Long term (current) use of anticoagulants: Secondary | ICD-10-CM

## 2015-12-21 DIAGNOSIS — I6350 Cerebral infarction due to unspecified occlusion or stenosis of unspecified cerebral artery: Secondary | ICD-10-CM

## 2015-12-21 DIAGNOSIS — Z954 Presence of other heart-valve replacement: Secondary | ICD-10-CM

## 2015-12-21 DIAGNOSIS — I359 Nonrheumatic aortic valve disorder, unspecified: Secondary | ICD-10-CM

## 2015-12-21 DIAGNOSIS — Z952 Presence of prosthetic heart valve: Secondary | ICD-10-CM

## 2015-12-21 DIAGNOSIS — I6930 Unspecified sequelae of cerebral infarction: Secondary | ICD-10-CM

## 2015-12-21 LAB — POCT INR: INR: 3.1

## 2016-01-18 ENCOUNTER — Ambulatory Visit (INDEPENDENT_AMBULATORY_CARE_PROVIDER_SITE_OTHER): Payer: 59 | Admitting: *Deleted

## 2016-01-18 DIAGNOSIS — I6350 Cerebral infarction due to unspecified occlusion or stenosis of unspecified cerebral artery: Secondary | ICD-10-CM | POA: Diagnosis not present

## 2016-01-18 DIAGNOSIS — Z952 Presence of prosthetic heart valve: Secondary | ICD-10-CM

## 2016-01-18 DIAGNOSIS — Z7901 Long term (current) use of anticoagulants: Secondary | ICD-10-CM | POA: Diagnosis not present

## 2016-01-18 DIAGNOSIS — I6930 Unspecified sequelae of cerebral infarction: Secondary | ICD-10-CM

## 2016-01-18 DIAGNOSIS — I359 Nonrheumatic aortic valve disorder, unspecified: Secondary | ICD-10-CM | POA: Diagnosis not present

## 2016-01-18 LAB — POCT INR: INR: 3.4

## 2016-02-22 ENCOUNTER — Ambulatory Visit (INDEPENDENT_AMBULATORY_CARE_PROVIDER_SITE_OTHER): Payer: 59 | Admitting: *Deleted

## 2016-02-22 DIAGNOSIS — Z952 Presence of prosthetic heart valve: Secondary | ICD-10-CM | POA: Diagnosis not present

## 2016-02-22 DIAGNOSIS — I6930 Unspecified sequelae of cerebral infarction: Secondary | ICD-10-CM

## 2016-02-22 DIAGNOSIS — Z7901 Long term (current) use of anticoagulants: Secondary | ICD-10-CM

## 2016-02-22 DIAGNOSIS — I6350 Cerebral infarction due to unspecified occlusion or stenosis of unspecified cerebral artery: Secondary | ICD-10-CM

## 2016-02-22 DIAGNOSIS — I359 Nonrheumatic aortic valve disorder, unspecified: Secondary | ICD-10-CM | POA: Diagnosis not present

## 2016-02-22 LAB — POCT INR: INR: 2.9

## 2016-02-26 ENCOUNTER — Other Ambulatory Visit: Payer: Self-pay | Admitting: Family Medicine

## 2016-02-26 DIAGNOSIS — G43009 Migraine without aura, not intractable, without status migrainosus: Secondary | ICD-10-CM

## 2016-02-27 NOTE — Telephone Encounter (Signed)
Is this okay to refill? 

## 2016-04-04 ENCOUNTER — Ambulatory Visit (INDEPENDENT_AMBULATORY_CARE_PROVIDER_SITE_OTHER): Payer: 59 | Admitting: *Deleted

## 2016-04-04 ENCOUNTER — Encounter (INDEPENDENT_AMBULATORY_CARE_PROVIDER_SITE_OTHER): Payer: Self-pay

## 2016-04-04 DIAGNOSIS — I359 Nonrheumatic aortic valve disorder, unspecified: Secondary | ICD-10-CM | POA: Diagnosis not present

## 2016-04-04 DIAGNOSIS — Z952 Presence of prosthetic heart valve: Secondary | ICD-10-CM | POA: Diagnosis not present

## 2016-04-04 DIAGNOSIS — I6930 Unspecified sequelae of cerebral infarction: Secondary | ICD-10-CM

## 2016-04-04 DIAGNOSIS — I6350 Cerebral infarction due to unspecified occlusion or stenosis of unspecified cerebral artery: Secondary | ICD-10-CM

## 2016-04-04 DIAGNOSIS — Z7901 Long term (current) use of anticoagulants: Secondary | ICD-10-CM | POA: Diagnosis not present

## 2016-04-04 LAB — POCT INR: INR: 2.3

## 2016-04-06 ENCOUNTER — Other Ambulatory Visit: Payer: Self-pay | Admitting: Cardiology

## 2016-04-25 ENCOUNTER — Ambulatory Visit (INDEPENDENT_AMBULATORY_CARE_PROVIDER_SITE_OTHER): Payer: 59 | Admitting: *Deleted

## 2016-04-25 DIAGNOSIS — Z952 Presence of prosthetic heart valve: Secondary | ICD-10-CM

## 2016-04-25 DIAGNOSIS — I6350 Cerebral infarction due to unspecified occlusion or stenosis of unspecified cerebral artery: Secondary | ICD-10-CM

## 2016-04-25 DIAGNOSIS — Z7901 Long term (current) use of anticoagulants: Secondary | ICD-10-CM | POA: Diagnosis not present

## 2016-04-25 DIAGNOSIS — I359 Nonrheumatic aortic valve disorder, unspecified: Secondary | ICD-10-CM

## 2016-04-25 DIAGNOSIS — I6930 Unspecified sequelae of cerebral infarction: Secondary | ICD-10-CM

## 2016-04-25 LAB — POCT INR: INR: 2.4

## 2016-05-08 ENCOUNTER — Ambulatory Visit (INDEPENDENT_AMBULATORY_CARE_PROVIDER_SITE_OTHER): Payer: 59 | Admitting: *Deleted

## 2016-05-08 DIAGNOSIS — I6350 Cerebral infarction due to unspecified occlusion or stenosis of unspecified cerebral artery: Secondary | ICD-10-CM

## 2016-05-08 DIAGNOSIS — Z952 Presence of prosthetic heart valve: Secondary | ICD-10-CM

## 2016-05-08 DIAGNOSIS — I359 Nonrheumatic aortic valve disorder, unspecified: Secondary | ICD-10-CM | POA: Diagnosis not present

## 2016-05-08 DIAGNOSIS — Z7901 Long term (current) use of anticoagulants: Secondary | ICD-10-CM

## 2016-05-08 DIAGNOSIS — I6930 Unspecified sequelae of cerebral infarction: Secondary | ICD-10-CM

## 2016-05-08 LAB — POCT INR: INR: 2.8

## 2016-06-23 ENCOUNTER — Emergency Department (HOSPITAL_COMMUNITY): Payer: 59 | Admitting: Anesthesiology

## 2016-06-23 ENCOUNTER — Encounter (HOSPITAL_COMMUNITY): Payer: Self-pay | Admitting: Emergency Medicine

## 2016-06-23 ENCOUNTER — Encounter (HOSPITAL_COMMUNITY)
Admission: EM | Disposition: A | Discharge status: Home / Self Care | Payer: Self-pay | Source: Home / Self Care | Attending: Physician Assistant

## 2016-06-23 ENCOUNTER — Observation Stay (HOSPITAL_COMMUNITY)
Admission: EM | Admit: 2016-06-23 | Discharge: 2016-06-24 | Disposition: A | Discharge status: Home / Self Care | Payer: 59 | Attending: Orthopedic Surgery | Admitting: Orthopedic Surgery

## 2016-06-23 ENCOUNTER — Emergency Department (HOSPITAL_COMMUNITY): Payer: 59

## 2016-06-23 DIAGNOSIS — S96821A Laceration of other specified muscles and tendons at ankle and foot level, right foot, initial encounter: Secondary | ICD-10-CM | POA: Diagnosis not present

## 2016-06-23 DIAGNOSIS — E785 Hyperlipidemia, unspecified: Secondary | ICD-10-CM | POA: Diagnosis not present

## 2016-06-23 DIAGNOSIS — S96121A Laceration of muscle and tendon of long extensor muscle of toe at ankle and foot level, right foot, initial encounter: Principal | ICD-10-CM | POA: Insufficient documentation

## 2016-06-23 DIAGNOSIS — I69398 Other sequelae of cerebral infarction: Secondary | ICD-10-CM | POA: Insufficient documentation

## 2016-06-23 DIAGNOSIS — S91311A Laceration without foreign body, right foot, initial encounter: Secondary | ICD-10-CM

## 2016-06-23 DIAGNOSIS — S96921A Laceration of unspecified muscle and tendon at ankle and foot level, right foot, initial encounter: Secondary | ICD-10-CM

## 2016-06-23 DIAGNOSIS — Z7901 Long term (current) use of anticoagulants: Secondary | ICD-10-CM | POA: Insufficient documentation

## 2016-06-23 DIAGNOSIS — G629 Polyneuropathy, unspecified: Secondary | ICD-10-CM | POA: Insufficient documentation

## 2016-06-23 DIAGNOSIS — Z86718 Personal history of other venous thrombosis and embolism: Secondary | ICD-10-CM | POA: Insufficient documentation

## 2016-06-23 DIAGNOSIS — W293XXA Contact with powered garden and outdoor hand tools and machinery, initial encounter: Secondary | ICD-10-CM | POA: Insufficient documentation

## 2016-06-23 DIAGNOSIS — Z79899 Other long term (current) drug therapy: Secondary | ICD-10-CM | POA: Insufficient documentation

## 2016-06-23 DIAGNOSIS — Z952 Presence of prosthetic heart valve: Secondary | ICD-10-CM | POA: Insufficient documentation

## 2016-06-23 DIAGNOSIS — G43909 Migraine, unspecified, not intractable, without status migrainosus: Secondary | ICD-10-CM | POA: Diagnosis not present

## 2016-06-23 HISTORY — PX: I & D EXTREMITY: SHX5045

## 2016-06-23 HISTORY — PX: TENDON REPAIR: SHX5111

## 2016-06-23 LAB — CBC
HCT: 46.5 % (ref 39.0–52.0)
Hemoglobin: 15.4 g/dL (ref 13.0–17.0)
MCH: 29.2 pg (ref 26.0–34.0)
MCHC: 33.1 g/dL (ref 30.0–36.0)
MCV: 88.1 fL (ref 78.0–100.0)
PLATELETS: 244 10*3/uL (ref 150–400)
RBC: 5.28 MIL/uL (ref 4.22–5.81)
RDW: 13 % (ref 11.5–15.5)
WBC: 5.7 10*3/uL (ref 4.0–10.5)

## 2016-06-23 LAB — POCT I-STAT 4, (NA,K, GLUC, HGB,HCT)
GLUCOSE: 118 mg/dL — AB (ref 65–99)
HCT: 31 % — ABNORMAL LOW (ref 39.0–52.0)
Hemoglobin: 10.5 g/dL — ABNORMAL LOW (ref 13.0–17.0)
Potassium: 3.9 mmol/L (ref 3.5–5.1)
Sodium: 142 mmol/L (ref 135–145)

## 2016-06-23 LAB — BASIC METABOLIC PANEL
Anion gap: 7 (ref 5–15)
BUN: 17 mg/dL (ref 6–20)
CO2: 22 mmol/L (ref 22–32)
CREATININE: 0.88 mg/dL (ref 0.61–1.24)
Calcium: 7.7 mg/dL — ABNORMAL LOW (ref 8.9–10.3)
Chloride: 109 mmol/L (ref 101–111)
GFR calc Af Amer: >60 mL/min (ref >60)
Glucose, Bld: 123 mg/dL — ABNORMAL HIGH (ref 65–99)
POTASSIUM: 4.1 mmol/L (ref 3.5–5.1)
SODIUM: 138 mmol/L (ref 135–145)

## 2016-06-23 LAB — PROTIME-INR
INR: 2.75
PROTHROMBIN TIME: 29.6 s — AB (ref 11.4–15.2)

## 2016-06-23 LAB — TYPE AND SCREEN
ABO/RH(D): A POS
Antibody Screen: NEGATIVE

## 2016-06-23 LAB — ABO/RH: ABO/RH(D): A POS

## 2016-06-23 SURGERY — IRRIGATION AND DEBRIDEMENT EXTREMITY
Anesthesia: General | Site: Foot | Laterality: Right

## 2016-06-23 MED ORDER — METOCLOPRAMIDE HCL 5 MG/ML IJ SOLN: 5.0000 mg | Freq: Three times a day (TID) | INTRAMUSCULAR | Status: DC | PRN

## 2016-06-23 MED ORDER — OXYCODONE HCL 5 MG PO TABS
ORAL_TABLET | ORAL | Status: AC
  Filled 2016-06-23: qty 1

## 2016-06-23 MED ORDER — FERROUS SULFATE 325 (65 FE) MG PO TABS
325.0000 mg | ORAL_TABLET | Freq: Three times a day (TID) | ORAL | Status: DC
Start: 2016-06-23 — End: 2016-06-24
  Administered 2016-06-23 – 2016-06-24 (×4): 325 mg via ORAL
  Filled 2016-06-23 (×3): qty 1

## 2016-06-23 MED ORDER — FENTANYL CITRATE (PF) 100 MCG/2ML IJ SOLN
INTRAMUSCULAR | Status: DC | PRN
  Administered 2016-06-23: 50 ug via INTRAVENOUS
  Administered 2016-06-23: 25 ug via INTRAVENOUS
  Administered 2016-06-23: 50 ug via INTRAVENOUS
  Administered 2016-06-23 (×2): 25 ug via INTRAVENOUS

## 2016-06-23 MED ORDER — FENTANYL CITRATE (PF) 100 MCG/2ML IJ SOLN
50.0000 ug | Freq: Once | INTRAMUSCULAR | Status: AC
  Administered 2016-06-23: 50 ug via INTRAVENOUS

## 2016-06-23 MED ORDER — ONDANSETRON HCL 4 MG/2ML IJ SOLN
INTRAMUSCULAR | Status: AC
  Filled 2016-06-23: qty 2

## 2016-06-23 MED ORDER — SUGAMMADEX SODIUM 200 MG/2ML IV SOLN
INTRAVENOUS | Status: DC | PRN
  Administered 2016-06-23: 200 mg via INTRAVENOUS

## 2016-06-23 MED ORDER — SODIUM CHLORIDE 0.9 % IV SOLN
INTRAVENOUS | Status: DC | PRN
  Administered 2016-06-23: 17:00:00 via INTRAVENOUS

## 2016-06-23 MED ORDER — OXYCODONE HCL 5 MG PO TABS
5.0000 mg | ORAL_TABLET | Freq: Once | ORAL | Status: AC | PRN
  Administered 2016-06-23: 5 mg via ORAL

## 2016-06-23 MED ORDER — FENTANYL CITRATE (PF) 250 MCG/5ML IJ SOLN
INTRAMUSCULAR | Status: AC
  Filled 2016-06-23: qty 5

## 2016-06-23 MED ORDER — ONDANSETRON HCL 4 MG/2ML IJ SOLN: 4.0000 mg | Freq: Once | INTRAMUSCULAR | Status: DC | PRN

## 2016-06-23 MED ORDER — ALBUMIN HUMAN 5 % IV SOLN
INTRAVENOUS | Status: DC | PRN
  Administered 2016-06-23: 18:00:00 via INTRAVENOUS

## 2016-06-23 MED ORDER — POLYETHYLENE GLYCOL 3350 17 G PO PACK: 17.0000 g | PACK | Freq: Every day | ORAL | Status: DC | PRN

## 2016-06-23 MED ORDER — CEFAZOLIN SODIUM-DEXTROSE 2-4 GM/100ML-% IV SOLN
2.0000 g | Freq: Once | INTRAVENOUS | Status: AC
  Administered 2016-06-23: 2 g via INTRAVENOUS
  Filled 2016-06-23: qty 100

## 2016-06-23 MED ORDER — METOCLOPRAMIDE HCL 5 MG PO TABS: 5.0000 mg | ORAL_TABLET | Freq: Three times a day (TID) | ORAL | Status: DC | PRN

## 2016-06-23 MED ORDER — LIDOCAINE HCL (CARDIAC) 20 MG/ML IV SOLN
INTRAVENOUS | Status: DC | PRN
  Administered 2016-06-23: 40 mg via INTRAVENOUS

## 2016-06-23 MED ORDER — MIDAZOLAM HCL 2 MG/2ML IJ SOLN
INTRAMUSCULAR | Status: AC
  Filled 2016-06-23: qty 2

## 2016-06-23 MED ORDER — MIDAZOLAM HCL 5 MG/5ML IJ SOLN
INTRAMUSCULAR | Status: DC | PRN
  Administered 2016-06-23 (×2): 1 mg via INTRAVENOUS

## 2016-06-23 MED ORDER — SODIUM CHLORIDE 0.9 % IR SOLN
Status: DC | PRN
  Administered 2016-06-23: 3000 mL

## 2016-06-23 MED ORDER — WARFARIN SODIUM 7.5 MG PO TABS: 7.5000 mg | ORAL_TABLET | Freq: Once | ORAL | Status: DC

## 2016-06-23 MED ORDER — ROCURONIUM BROMIDE 50 MG/5ML IV SOSY
PREFILLED_SYRINGE | INTRAVENOUS | Status: AC
  Filled 2016-06-23: qty 5

## 2016-06-23 MED ORDER — EPHEDRINE 5 MG/ML INJ
INTRAVENOUS | Status: AC
  Filled 2016-06-23: qty 10

## 2016-06-23 MED ORDER — CEFAZOLIN SODIUM-DEXTROSE 2-4 GM/100ML-% IV SOLN
2.0000 g | Freq: Four times a day (QID) | INTRAVENOUS | Status: AC
  Administered 2016-06-23 – 2016-06-24 (×3): 2 g via INTRAVENOUS
  Filled 2016-06-23 (×3): qty 100

## 2016-06-23 MED ORDER — SODIUM CHLORIDE 0.9 % IV SOLN
INTRAVENOUS | Status: DC
  Administered 2016-06-23 – 2016-06-24 (×3): via INTRAVENOUS

## 2016-06-23 MED ORDER — GABAPENTIN 800 MG PO TABS
800.0000 mg | ORAL_TABLET | Freq: Two times a day (BID) | ORAL | Status: DC
  Filled 2016-06-23: qty 1

## 2016-06-23 MED ORDER — SUGAMMADEX SODIUM 200 MG/2ML IV SOLN
INTRAVENOUS | Status: AC
  Filled 2016-06-23: qty 2

## 2016-06-23 MED ORDER — LACTATED RINGERS IV SOLN
INTRAVENOUS | Status: DC | PRN
  Administered 2016-06-23 (×2): via INTRAVENOUS

## 2016-06-23 MED ORDER — ATORVASTATIN CALCIUM 10 MG PO TABS
10.0000 mg | ORAL_TABLET | Freq: Every day | ORAL | Status: DC
  Administered 2016-06-24: 10 mg via ORAL
  Filled 2016-06-23: qty 1

## 2016-06-23 MED ORDER — FENTANYL CITRATE (PF) 100 MCG/2ML IJ SOLN
25.0000 ug | INTRAMUSCULAR | Status: DC | PRN
  Administered 2016-06-23: 50 ug via INTRAVENOUS
  Administered 2016-06-23: 25 ug via INTRAVENOUS

## 2016-06-23 MED ORDER — PHENYLEPHRINE HCL 10 MG/ML IJ SOLN
INTRAVENOUS | Status: DC | PRN
  Administered 2016-06-23: 50 ug/min via INTRAVENOUS

## 2016-06-23 MED ORDER — OXYCODONE HCL 5 MG PO TABS
5.0000 mg | ORAL_TABLET | ORAL | Status: DC | PRN
  Administered 2016-06-23 (×2): 5 mg via ORAL
  Administered 2016-06-24 (×4): 10 mg via ORAL
  Filled 2016-06-23: qty 2
  Filled 2016-06-23 (×2): qty 1
  Filled 2016-06-23 (×3): qty 2

## 2016-06-23 MED ORDER — ACETAMINOPHEN 325 MG PO TABS: 650.0000 mg | ORAL_TABLET | Freq: Four times a day (QID) | ORAL | Status: DC | PRN

## 2016-06-23 MED ORDER — MORPHINE SULFATE (PF) 2 MG/ML IV SOLN
2.0000 mg | INTRAVENOUS | Status: DC | PRN
  Administered 2016-06-23 – 2016-06-24 (×4): 2 mg via INTRAVENOUS
  Filled 2016-06-23 (×4): qty 1

## 2016-06-23 MED ORDER — SODIUM CHLORIDE 0.9 % IV BOLUS (SEPSIS)
1000.0000 mL | Freq: Once | INTRAVENOUS | Status: AC
  Administered 2016-06-23: 1000 mL via INTRAVENOUS

## 2016-06-23 MED ORDER — ONDANSETRON HCL 4 MG PO TABS: 4.0000 mg | ORAL_TABLET | Freq: Four times a day (QID) | ORAL | Status: DC | PRN

## 2016-06-23 MED ORDER — 0.9 % SODIUM CHLORIDE (POUR BTL) OPTIME
TOPICAL | Status: DC | PRN
  Administered 2016-06-23: 1000 mL

## 2016-06-23 MED ORDER — BSS IO SOLN
INTRAOCULAR | Status: AC
  Filled 2016-06-23: qty 15

## 2016-06-23 MED ORDER — ONDANSETRON HCL 4 MG/2ML IJ SOLN
4.0000 mg | Freq: Four times a day (QID) | INTRAMUSCULAR | Status: DC | PRN
  Administered 2016-06-24: 4 mg via INTRAVENOUS
  Filled 2016-06-23: qty 2

## 2016-06-23 MED ORDER — ROCURONIUM BROMIDE 100 MG/10ML IV SOLN
INTRAVENOUS | Status: DC | PRN
  Administered 2016-06-23: 50 mg via INTRAVENOUS

## 2016-06-23 MED ORDER — TETANUS-DIPHTH-ACELL PERTUSSIS 5-2.5-18.5 LF-MCG/0.5 IM SUSP
0.5000 mL | Freq: Once | INTRAMUSCULAR | Status: AC
  Administered 2016-06-23: 0.5 mL via INTRAMUSCULAR
  Filled 2016-06-23: qty 0.5

## 2016-06-23 MED ORDER — PROPOFOL 10 MG/ML IV BOLUS
INTRAVENOUS | Status: DC | PRN
  Administered 2016-06-23: 130 mg via INTRAVENOUS

## 2016-06-23 MED ORDER — PHENYLEPHRINE 40 MCG/ML (10ML) SYRINGE FOR IV PUSH (FOR BLOOD PRESSURE SUPPORT)
PREFILLED_SYRINGE | INTRAVENOUS | Status: AC
  Filled 2016-06-23: qty 10

## 2016-06-23 MED ORDER — FENTANYL CITRATE (PF) 100 MCG/2ML IJ SOLN
INTRAMUSCULAR | Status: AC
  Administered 2016-06-23: 25 ug via INTRAVENOUS
  Filled 2016-06-23: qty 2

## 2016-06-23 MED ORDER — ACETAMINOPHEN 650 MG RE SUPP: 650.0000 mg | Freq: Four times a day (QID) | RECTAL | Status: DC | PRN

## 2016-06-23 MED ORDER — FENTANYL CITRATE (PF) 100 MCG/2ML IJ SOLN
INTRAMUSCULAR | Status: AC
  Filled 2016-06-23: qty 2

## 2016-06-23 MED ORDER — PROPOFOL 10 MG/ML IV BOLUS
INTRAVENOUS | Status: AC
  Filled 2016-06-23: qty 20

## 2016-06-23 MED ORDER — DOCUSATE SODIUM 100 MG PO CAPS
100.0000 mg | ORAL_CAPSULE | Freq: Two times a day (BID) | ORAL | Status: DC
  Administered 2016-06-23 – 2016-06-24 (×2): 100 mg via ORAL
  Filled 2016-06-23 (×2): qty 1

## 2016-06-23 MED ORDER — ONDANSETRON HCL 4 MG/2ML IJ SOLN
INTRAMUSCULAR | Status: DC | PRN
  Administered 2016-06-23: 4 mg via INTRAVENOUS

## 2016-06-23 MED ORDER — LIDOCAINE 2% (20 MG/ML) 5 ML SYRINGE
INTRAMUSCULAR | Status: AC
  Filled 2016-06-23: qty 5

## 2016-06-23 MED ORDER — CHLORHEXIDINE GLUCONATE 4 % EX LIQD: 60.0000 mL | Freq: Once | CUTANEOUS | Status: DC

## 2016-06-23 MED ORDER — GENTAMICIN SULFATE 40 MG/ML IJ SOLN
2.0000 mg/kg | Freq: Once | INTRAMUSCULAR | Status: AC
  Administered 2016-06-23: 170 mg via INTRAVENOUS
  Filled 2016-06-23: qty 4.25

## 2016-06-23 MED ORDER — AMOXICILLIN 500 MG PO CAPS: 2000.0000 mg | ORAL_CAPSULE | ORAL | Status: DC

## 2016-06-23 MED ORDER — OXYCODONE HCL 5 MG/5ML PO SOLN: 5.0000 mg | Freq: Once | ORAL | Status: AC | PRN

## 2016-06-23 MED ORDER — ACETAMINOPHEN 325 MG PO TABS: 325.0000 mg | ORAL_TABLET | Freq: Four times a day (QID) | ORAL | Status: DC | PRN

## 2016-06-23 SURGICAL SUPPLY — 52 items
BANDAGE ACE 4X5 VEL STRL LF (GAUZE/BANDAGES/DRESSINGS) ×1 IMPLANT
BNDG COHESIVE 4X5 TAN STRL (GAUZE/BANDAGES/DRESSINGS) ×3 IMPLANT
BNDG GAUZE ELAST 4 BULKY (GAUZE/BANDAGES/DRESSINGS) ×5 IMPLANT
BRUSH SCRUB DISP (MISCELLANEOUS) ×3 IMPLANT
COVER SURGICAL LIGHT HANDLE (MISCELLANEOUS) ×3 IMPLANT
CUFF TOURNIQUET SINGLE 18IN (TOURNIQUET CUFF) ×3 IMPLANT
CUFF TOURNIQUET SINGLE 24IN (TOURNIQUET CUFF) IMPLANT
CUFF TOURNIQUET SINGLE 34IN LL (TOURNIQUET CUFF) IMPLANT
DRAPE IMP U-DRAPE 54X76 (DRAPES) ×3 IMPLANT
DRAPE U-SHAPE 47X51 STRL (DRAPES) ×3 IMPLANT
DRSG ADAPTIC 3X8 NADH LF (GAUZE/BANDAGES/DRESSINGS) ×3 IMPLANT
DRSG PAD ABDOMINAL 8X10 ST (GAUZE/BANDAGES/DRESSINGS) ×8 IMPLANT
DURAPREP 26ML APPLICATOR (WOUND CARE) ×1 IMPLANT
ELECT REM PT RETURN 9FT ADLT (ELECTROSURGICAL) ×3
ELECTRODE REM PT RTRN 9FT ADLT (ELECTROSURGICAL) IMPLANT
FACESHIELD WRAPAROUND (MASK) IMPLANT
FACESHIELD WRAPAROUND OR TEAM (MASK) ×1 IMPLANT
GAUZE SPONGE 4X4 12PLY STRL (GAUZE/BANDAGES/DRESSINGS) ×3 IMPLANT
GLOVE BIOGEL PI ORTHO PRO 7.5 (GLOVE) ×2
GLOVE BIOGEL PI ORTHO PRO SZ8 (GLOVE) ×2
GLOVE ORTHO TXT STRL SZ7.5 (GLOVE) ×1 IMPLANT
GLOVE PI ORTHO PRO STRL 7.5 (GLOVE) ×1 IMPLANT
GLOVE PI ORTHO PRO STRL SZ8 (GLOVE) ×1 IMPLANT
GLOVE SURG ORTHO 8.5 STRL (GLOVE) ×3 IMPLANT
GOWN STRL REUS W/ TWL LRG LVL3 (GOWN DISPOSABLE) ×1 IMPLANT
GOWN STRL REUS W/ TWL XL LVL3 (GOWN DISPOSABLE) ×2 IMPLANT
GOWN STRL REUS W/TWL LRG LVL3 (GOWN DISPOSABLE) ×3
GOWN STRL REUS W/TWL XL LVL3 (GOWN DISPOSABLE) ×3
HANDPIECE INTERPULSE COAX TIP (DISPOSABLE) ×3
KIT BASIN OR (CUSTOM PROCEDURE TRAY) ×3 IMPLANT
KIT ROOM TURNOVER OR (KITS) ×3 IMPLANT
MANIFOLD NEPTUNE II (INSTRUMENTS) ×3 IMPLANT
NS IRRIG 1000ML POUR BTL (IV SOLUTION) ×3 IMPLANT
PACK ORTHO EXTREMITY (CUSTOM PROCEDURE TRAY) ×3 IMPLANT
PAD ARMBOARD 7.5X6 YLW CONV (MISCELLANEOUS) ×6 IMPLANT
PENCIL BUTTON HOLSTER BLD 10FT (ELECTRODE) IMPLANT
SET HNDPC FAN SPRY TIP SCT (DISPOSABLE) IMPLANT
SPONGE LAP 18X18 X RAY DECT (DISPOSABLE) ×3 IMPLANT
SPONGE LAP 4X18 X RAY DECT (DISPOSABLE) ×3 IMPLANT
STOCKINETTE IMPERVIOUS 9X36 MD (GAUZE/BANDAGES/DRESSINGS) ×3 IMPLANT
SUT ETHILON 2 0 FS 18 (SUTURE) ×2 IMPLANT
SUT PROLENE 3 0 PS 1 (SUTURE) ×2 IMPLANT
SUT VIC AB 2-0 CT1 27 (SUTURE) ×3
SUT VIC AB 2-0 CT1 TAPERPNT 27 (SUTURE) IMPLANT
TOWEL OR 17X24 6PK STRL BLUE (TOWEL DISPOSABLE) ×3 IMPLANT
TOWEL OR 17X26 10 PK STRL BLUE (TOWEL DISPOSABLE) ×3 IMPLANT
TUBE ANAEROBIC SPECIMEN COL (MISCELLANEOUS) IMPLANT
TUBE CONNECTING 12'X1/4 (SUCTIONS) ×1
TUBE CONNECTING 12X1/4 (SUCTIONS) ×2 IMPLANT
UNDERPAD 30X30 (UNDERPADS AND DIAPERS) ×5 IMPLANT
WATER STERILE IRR 1000ML POUR (IV SOLUTION) ×3 IMPLANT
YANKAUER SUCT BULB TIP NO VENT (SUCTIONS) ×3 IMPLANT

## 2016-06-23 NOTE — Brief Op Note (Signed)
06/23/2016  6:44 PM  PATIENT:  Robert [name]  56 y.o. male  PRE-OPERATIVE DIAGNOSIS:  chain saw injury with tendon and bone involvement right foot  POST-OPERATIVE DIAGNOSIS:  chain saw injury with tendon and bone involvement.right foot  PROCEDURE:  Irrigation and debridement of right foot chainsaw injury  SURGEON:  Surgeon(s) and Role:    * Netta Cedars, MD - Primary  PHYSICIAN ASSISTANT:   ASSISTANTS: none   ANESTHESIA:   general  EBL:  Total I/O In: 2450 [I.V.:2100; IV Piggyback:350] Out: 330 [Urine:230; Blood:100]  BLOOD ADMINISTERED:none  DRAINS: none   LOCAL MEDICATIONS USED:  NONE  SPECIMEN:  No Specimen  DISPOSITION OF SPECIMEN:  N/A  COUNTS:  YES  TOURNIQUET:   Total Tourniquet Time Documented: Calf (Right) - 42 minutes Total: Calf (Right) - 42 minutes   DICTATION: .Other Dictation: Dictation Number [id number]  PLAN OF CARE: Admit for overnight observation  PATIENT DISPOSITION:  PACU - hemodynamically stable.   Delay start of Pharmacological VTE agent (>24hrs) due to surgical blood loss or risk of bleeding: not applicable

## 2016-06-23 NOTE — Interval H&P Note (Signed)
History and Physical Interval Note:  06/23/2016 5:13 PM  Robert [name]  has presented today for surgery, with the diagnosis of tendon repair   The various methods of treatment have been discussed with the patient and family. After consideration of risks, benefits and other options for treatment, the patient has consented to  Procedure(s): IRRIGATION AND DEBRIDEMENT FOOT (Right) TENDON REPAIR (Right) as a surgical intervention .  The patient's history has been reviewed, patient examined, no change in status, stable for surgery.  I have reviewed the patient's chart and labs.  Questions were answered to the patient's satisfaction.     [name],STEVEN R

## 2016-06-23 NOTE — Consult Note (Signed)
Robert [name] is an 56 y.o. male.    Chief Complaint: right foot pain and laceration  HPI: 56 y/o male with hx of aortic valve replacement and recent INR of 2.75 c/o right foot laceration s/p chainsaw injury. Pt cutting a tree and felt like the tree was going to hit him and he dropped the chainsaw on his left foot. Sustained laceration and probable tendon damage. Bleeding slowed and pt presented to the emergency department. Denies any other injuries. 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  PCP:  Wyatt Haste, MD  PMH: Past Medical History:  Diagnosis Date  . Aneurysm of ascending aorta (HCC)   . Blood transfusion without reported diagnosis   . Cataract    BEGINNING  . CVA (cerebral infarction)   . DVT (deep venous thrombosis) ([place])   . Hx of adenomatous colonic polyps 05/11/2014  . Hyperlipidemia   . Migraine headache   . Renal stone   . S/P aortic valve replacement   . Stroke ([place])   . Subacute bacterial endocarditis (SBE)     PSH: Past Surgical History:  Procedure Laterality Date  . AORTIC VALVE REPLACEMENT  2007  . CARDIAC CATHETERIZATION  01/2005    Social History:  reports that he has never smoked. He has never used smokeless tobacco. He reports that he drinks about 1.2 oz of alcohol per week . He reports that he does not use drugs.  Allergies:  No Active Allergies  Medications: Current Facility-Administered Medications  Medication Dose Route Frequency Provider Last Rate Last Dose  . fentaNYL (SUBLIMAZE) 100 MCG/2ML injection            Current Outpatient Prescriptions  Medication Sig Dispense Refill  . atorvastatin (LIPITOR) 10 MG tablet Take 1 tablet (10 mg total) by mouth daily. 90 tablet 0  . atorvastatin (LIPITOR) 10 MG tablet TAKE 1 TABLET (10 MG TOTAL) BY MOUTH DAILY. 90 tablet 2  . gabapentin (NEURONTIN) 800 MG tablet TAKE 1 TABLET TWICE A DAY 180 tablet 2  . warfarin (COUMADIN) 5 MG tablet TAKE AS DIRECTED BY  COUMADIN CLINIC 150 tablet 0  . warfarin (COUMADIN) 5 MG tablet TAKE 1.5 TABLETS BY MOUTH DAILY OR AS DIRECTED BY COUMADIN CLINIC 45 tablet 3    Results for orders placed or performed during the hospital encounter of 06/23/16 (from the past 48 hour(s))  Type and screen [place]     Status: None   Collection Time: 06/23/16  2:54 PM  Result Value Ref Range   ABO/RH(D) A POS    Antibody Screen NEG    Sample Expiration 06/26/2016   Protime-INR     Status: Abnormal   Collection Time: 06/23/16  2:54 PM  Result Value Ref Range   Prothrombin Time 29.6 (H) 11.4 - 15.2 seconds   INR 2.75   CBC     Status: None   Collection Time: 06/23/16  2:54 PM  Result Value Ref Range   WBC 5.7 4.0 - 10.5 K/uL   RBC 5.28 4.22 - 5.81 MIL/uL   Hemoglobin 15.4 13.0 - 17.0 g/dL   HCT 46.5 39.0 - 52.0 %   MCV 88.1 78.0 - 100.0 fL   MCH 29.2 26.0 - 34.0 pg   MCHC 33.1 30.0 - 36.0 g/dL   RDW 13.0 11.5 - 15.5 %   Platelets 244 150 - 400 K/uL  ABO/Rh     Status: None (Preliminary result)   Collection Time:  06/23/16  2:54 PM  Result Value Ref Range   ABO/RH(D) A POS    Dg Foot Complete Right  Result Date: 06/23/2016 CLINICAL DATA:  Pt lacerated right foot with chainsaw today while cutting down a tree. EXAM: RIGHT FOOT COMPLETE - 3+ VIEW COMPARISON:  None. FINDINGS: There is irregular disruption of the dorsal cortex of the mid shaft of the first metatarsal with overlying soft tissue air, swelling and multiple small radiopaque bone fragments. No convincing metallic foreign body. No complete fracture. The joints are normally spaced and aligned. IMPRESSION: 1. Chainsaw injury with disruption along the dorsal cortex of the midshaft of the first metatarsal without a complete fracture. There is an associated soft tissue laceration with soft tissue air and multiple small soft tissue bone fragments. Electronically Signed   By: Lajean Manes M.D.   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 Bleeding laceration to right foot Unable to extend 1st and 2nd toes on the right foot  Physical Exam: Alert and appropriate 56 y/o male in no acute distress So signs of injury to upper extremities Right foot: actively bleeding laceration to dorsal foot with exposed tendon edges Pt able to flex all of his toes and has good sensation distally Antalgic gait  Physical Exam   Assessment/Plan Assessment: right foot laceration with extensor tendon damage  Plan: Plan to admit pt for surgical management of the right foot with possible extensor repair Pt has been NPO since early this morning Keep NPO Pt has had ancef and will have gentamycin in the OR Tetanus up to date

## 2016-06-23 NOTE — Anesthesia Procedure Notes (Signed)
Procedure Name: Intubation Date/Time: 06/23/2016 5:29 PM Performed by: Suzy Bouchard Pre-anesthesia Checklist: Patient identified, Emergency Drugs available, Timeout performed, Patient being monitored and Suction available Patient Re-evaluated:Patient Re-evaluated prior to inductionOxygen Delivery Method: Circle system utilized Preoxygenation: Pre-oxygenation with 100% oxygen Intubation Type: IV induction Ventilation: Mask ventilation without difficulty Laryngoscope Size: Miller and 2 Grade View: Grade I Tube type: Oral Tube size: 7.5 mm Number of attempts: 1 Airway Equipment and Method: Stylet Placement Confirmation: ETT inserted through vocal cords under direct vision,  positive ETCO2 and breath sounds checked- equal and bilateral Secured at: 21 cm Tube secured with: Tape Dental Injury: Teeth and Oropharynx as per pre-operative assessment

## 2016-06-23 NOTE — Progress Notes (Signed)
Warfarin per Pharmacy -Pt took warfarin this morning -Hold warfarin tonight -Daily INR  Robert [name]  06/23/2016 8:44 PM

## 2016-06-23 NOTE — ED Provider Notes (Signed)
[facility] DEPT Provider Note   CSN: [id number] Arrival date & time: 06/23/16  1436     History   Chief Complaint Chief Complaint  Patient presents with  . Laceration    HPI Robert [name] is a 56 y.o. male.  HPI   Patient is a 56 year old male on Coumadin for an aortic valve, mechanical. Patient was using a chainsaw got distracted and chainsawed through his right foot.  Pt unable to move right toes.   Past Medical History:  Diagnosis Date  . Aneurysm of ascending aorta (HCC)   . Blood transfusion without reported diagnosis   . Cataract    BEGINNING  . CVA (cerebral infarction)   . DVT (deep venous thrombosis) ([place])   . Hx of adenomatous colonic polyps 05/11/2014  . Hyperlipidemia   . Migraine headache   . Renal stone   . S/P aortic valve replacement   . Stroke ([place])   . Subacute bacterial endocarditis (SBE)     Patient Active Problem List   Diagnosis Date Noted  . Cataract 02/23/2015  . Hx of adenomatous colonic polyps 05/11/2014  . Hyperlipidemia LDL goal <100 02/15/2014  . Migraine without aura and without status migrainosus, not intractable 02/15/2014  . Unspecified late effects of cerebrovascular disease 04/29/2013  . Long term current use of anticoagulant therapy 06/27/2010  . History of CVA with residual deficit 07/30/2008  . History of aortic valve replacement 04/17/2005    Past Surgical History:  Procedure Laterality Date  . AORTIC VALVE REPLACEMENT  2007  . CARDIAC CATHETERIZATION  01/2005       Home Medications    Prior to Admission medications   Medication Sig Start Date End Date Taking? Authorizing Provider  atorvastatin (LIPITOR) 10 MG tablet Take 1 tablet (10 mg total) by mouth daily. 02/23/15   [name], MD  atorvastatin (LIPITOR) 10 MG tablet TAKE 1 TABLET (10 MG TOTAL) BY MOUTH DAILY. 08/15/15   Dorothy Spark, MD  gabapentin (NEURONTIN) 800 MG tablet TAKE 1 TABLET TWICE A DAY 02/27/16   [name], MD  warfarin  (COUMADIN) 5 MG tablet TAKE AS DIRECTED BY COUMADIN CLINIC 04/26/15   Dorothy Spark, MD  warfarin (COUMADIN) 5 MG tablet TAKE 1.5 TABLETS BY MOUTH DAILY OR AS DIRECTED BY COUMADIN CLINIC 04/06/16   Dorothy Spark, MD    Family History Family History  Problem Relation Age of Onset  . CVA Mother 25    hemorrhagic cva  . Colon cancer Paternal Grandfather   . Colon cancer Paternal Grandmother   . Diabetes Neg Hx   . Kidney disease Neg Hx   . Esophageal cancer Neg Hx   . Gallbladder disease Neg Hx   . Heart disease Neg Hx     Social History Social History  Substance Use Topics  . Smoking status: Never Smoker  . Smokeless tobacco: Never Used  . Alcohol use 1.2 oz/week    2 Standard drinks or equivalent per week     Comment: Drinks 1 beer every 4 days     Allergies   Amoxicillin   Review of Systems Review of Systems  Respiratory: Negative for shortness of breath.   Cardiovascular: Negative for chest pain.  Gastrointestinal: Negative for abdominal pain.  All other systems reviewed and are negative.    Physical Exam Updated Vital Signs BP 113/70   Pulse 68   Resp 18   Ht 5\' 11"  (1.803 m)   Wt 189 lb (85.7 kg)  SpO2 99%   BMI 26.36 kg/m   Physical Exam  Constitutional: He is oriented to person, place, and time. He appears well-nourished.  HENT:  Head: Normocephalic.  Eyes: Conjunctivae are normal.  Cardiovascular: Normal rate.   Pulmonary/Chest: Effort normal.  Musculoskeletal:  14 x 10 macerated tissue. Patient's extensor tendons are visible, partially macerated. Inability to flex or extend his toes at this time.  Neurological: He is oriented to person, place, and time.  Skin: Skin is warm and dry. He is not diaphoretic.  Psychiatric: He has a normal mood and affect. His behavior is normal.     ED Treatments / Results  Labs (all labs ordered are listed, but only abnormal results are displayed) Labs Reviewed  PROTIME-INR  CBC  COMPREHENSIVE  METABOLIC PANEL  TYPE AND SCREEN    EKG  EKG Interpretation None       Radiology No results found.  Procedures Procedures (including critical care time)  Medications Ordered in ED Medications  fentaNYL (SUBLIMAZE) 100 MCG/2ML injection (not administered)  ceFAZolin (ANCEF) IVPB 2g/100 mL premix (2 g Intravenous New Bag/Given 06/23/16 1503)  fentaNYL (SUBLIMAZE) injection 50 mcg (50 mcg Intravenous Given 06/23/16 1459)  sodium chloride 0.9 % bolus 1,000 mL (1,000 mLs Intravenous New Bag/Given 06/23/16 1459)  Tdap (BOOSTRIX) injection 0.5 mL (0.5 mLs Intramuscular Given 06/23/16 1503)     Initial Impression / Assessment and Plan / ED Course  I have reviewed the triage vital signs and the nursing notes.  Pertinent labs & imaging results that were available during my care of the patient were reviewed by me and considered in my medical decision making (see chart for details).     Patient is a 56 year old male presenting with chainsaw injury to the dorsal surface of the right foot. Patient has large area of macerated tissue in his damaged his flexor tendons. Bone exposed.   Patient on Coumadin for mechanical valve.  Patient had extensive bleeding on arrival. Several figure of 8 sutures were placed in order to control bleeding.  LACERATION REPAIR Performed by: Gardiner Sleeper Authorized by: Gardiner Sleeper Consent: Verbal consent obtained. Risks and benefits: risks, benefits and alternatives were discussed Consent given by: patient Patient identity confirmed: provided demographic data Prepped and Draped in normal sterile fashion Wound explored  Macerated tissue.  3 figure of 8, 3-0 Nylon placed to control bleeding.  Patient tolerance: Patient tolerated the procedure well with no immediate complications.  CRITICAL CARE Performed by: Gardiner Sleeper Total critical care time: 30 minutes Critical care time was exclusive of separately billable procedures and  treating other patients. Critical care was necessary to treat or prevent imminent or life-threatening deterioration. Critical care was time spent personally by me on the following activities: development of treatment plan with patient and/or surrogate as well as nursing, discussions with consultants, evaluation of patient's response to treatment, examination of patient, obtaining history from patient or surrogate, ordering and performing treatments and interventions, ordering and review of laboratory studies, ordering and review of radiographic studies, pulse oximetry and re-evaluation of patient's condition.   3:43 PM Discussed with Ortho, who will come see patient.  Final Clinical Impressions(s) / ED Diagnoses   Final diagnoses:  None    New Prescriptions New Prescriptions   No medications on file     Tkai Large Julio Alm, MD 06/23/16 1544

## 2016-06-23 NOTE — ED Triage Notes (Signed)
Pt here from home with a lac to the right foot from a chainsaw , pt is on blood thinners

## 2016-06-23 NOTE — Anesthesia Postprocedure Evaluation (Addendum)
Anesthesia Post Note  Patient: Robert [name]  Procedure(s) Performed: Procedure(s) (LRB): IRRIGATION AND DEBRIDEMENT FOOT (Right) TENDON REPAIR (Right)  Patient location during evaluation: PACU Anesthesia Type: General Level of consciousness: awake, awake and alert and oriented Pain management: pain level controlled Vital Signs Assessment: post-procedure vital signs reviewed and stable Respiratory status: spontaneous breathing, nonlabored ventilation and respiratory function stable Cardiovascular status: blood pressure returned to baseline Anesthetic complications: no       Last Vitals:  Vitals:   06/23/16 1932 06/23/16 2024  BP: 119/69 118/66  Pulse:  81  Resp:    Temp:  36.8 C    Last Pain:  Vitals:   06/23/16 2024  TempSrc: Oral  PainSc:                  Rendi Mapel COKER

## 2016-06-23 NOTE — Anesthesia Preprocedure Evaluation (Signed)
Anesthesia Evaluation  Patient identified by MRN, date of birth, ID band Patient awake    Reviewed: Allergy & Precautions, NPO status , Patient's Chart, lab work & pertinent test results  Airway Mallampati: II  TM Distance: >3 FB Neck ROM: Full    Dental  (+) Teeth Intact, Dental Advisory Given   Pulmonary    breath sounds clear to auscultation       Cardiovascular  Rhythm:Regular Rate:Normal + Systolic Click    Neuro/Psych    GI/Hepatic   Endo/Other    Renal/GU      Musculoskeletal   Abdominal   Peds  Hematology   Anesthesia Other Findings   Reproductive/Obstetrics                             Anesthesia Physical Anesthesia Plan  ASA: III and emergent  Anesthesia Plan: General   Post-op Pain Management:    Induction: Intravenous  Airway Management Planned: Oral ETT  Additional Equipment:   Intra-op Plan:   Post-operative Plan: Extubation in OR  Informed Consent: I have reviewed the patients History and Physical, chart, labs and discussed the procedure including the risks, benefits and alternatives for the proposed anesthesia with the patient or authorized representative who has indicated his/her understanding and acceptance.   Dental advisory given  Plan Discussed with: CRNA and Anesthesiologist  Anesthesia Plan Comments:         Anesthesia Quick Evaluation

## 2016-06-23 NOTE — ED Notes (Signed)
Report given to Robby RN , pt transported to OR holding

## 2016-06-23 NOTE — Transfer of Care (Signed)
Immediate Anesthesia Transfer of Care Note  Patient: Robert [name]  Procedure(s) Performed: Procedure(s): IRRIGATION AND DEBRIDEMENT FOOT (Right) TENDON REPAIR (Right)  Patient Location: PACU  Anesthesia Type:General  Level of Consciousness: awake, alert  and oriented  Airway & Oxygen Therapy: Patient Spontanous Breathing  Post-op Assessment: Report given to RN, Post -op Vital signs reviewed and stable and Patient moving all extremities  Post vital signs: Reviewed and stable  Last Vitals:  Vitals:   06/23/16 1833 06/23/16 1836  BP: 118/77   Pulse: 86   Resp: (!) 51   Temp:  36.6 C    Last Pain:  Vitals:   06/23/16 1836  PainSc: 6          Complications: No apparent anesthesia complications

## 2016-06-23 NOTE — H&P (View-Only) (Signed)
Robert [name] is an 56 y.o. male.    Chief Complaint: right foot pain and laceration  HPI: 56 y/o male with hx of aortic valve replacement and recent INR of 2.75 c/o right foot laceration s/p chainsaw injury. Pt cutting a tree and felt like the tree was going to hit him and he dropped the chainsaw on his left foot. Sustained laceration and probable tendon damage. Bleeding slowed and pt presented to the emergency department. Denies any other injuries. 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  PCP:  Wyatt Haste, MD  PMH: Past Medical History:  Diagnosis Date  . Aneurysm of ascending aorta (HCC)   . Blood transfusion without reported diagnosis   . Cataract    BEGINNING  . CVA (cerebral infarction)   . DVT (deep venous thrombosis) ([place])   . Hx of adenomatous colonic polyps 05/11/2014  . Hyperlipidemia   . Migraine headache   . Renal stone   . S/P aortic valve replacement   . Stroke ([place])   . Subacute bacterial endocarditis (SBE)     PSH: Past Surgical History:  Procedure Laterality Date  . AORTIC VALVE REPLACEMENT  2007  . CARDIAC CATHETERIZATION  01/2005    Social History:  reports that he has never smoked. He has never used smokeless tobacco. He reports that he drinks about 1.2 oz of alcohol per week . He reports that he does not use drugs.  Allergies:  No Active Allergies  Medications: Current Facility-Administered Medications  Medication Dose Route Frequency Provider Last Rate Last Dose  . fentaNYL (SUBLIMAZE) 100 MCG/2ML injection            Current Outpatient Prescriptions  Medication Sig Dispense Refill  . atorvastatin (LIPITOR) 10 MG tablet Take 1 tablet (10 mg total) by mouth daily. 90 tablet 0  . atorvastatin (LIPITOR) 10 MG tablet TAKE 1 TABLET (10 MG TOTAL) BY MOUTH DAILY. 90 tablet 2  . gabapentin (NEURONTIN) 800 MG tablet TAKE 1 TABLET TWICE A DAY 180 tablet 2  . warfarin (COUMADIN) 5 MG tablet TAKE AS DIRECTED BY  COUMADIN CLINIC 150 tablet 0  . warfarin (COUMADIN) 5 MG tablet TAKE 1.5 TABLETS BY MOUTH DAILY OR AS DIRECTED BY COUMADIN CLINIC 45 tablet 3    Results for orders placed or performed during the hospital encounter of 06/23/16 (from the past 48 hour(s))  Type and screen [place]     Status: None   Collection Time: 06/23/16  2:54 PM  Result Value Ref Range   ABO/RH(D) A POS    Antibody Screen NEG    Sample Expiration 06/26/2016   Protime-INR     Status: Abnormal   Collection Time: 06/23/16  2:54 PM  Result Value Ref Range   Prothrombin Time 29.6 (H) 11.4 - 15.2 seconds   INR 2.75   CBC     Status: None   Collection Time: 06/23/16  2:54 PM  Result Value Ref Range   WBC 5.7 4.0 - 10.5 K/uL   RBC 5.28 4.22 - 5.81 MIL/uL   Hemoglobin 15.4 13.0 - 17.0 g/dL   HCT 46.5 39.0 - 52.0 %   MCV 88.1 78.0 - 100.0 fL   MCH 29.2 26.0 - 34.0 pg   MCHC 33.1 30.0 - 36.0 g/dL   RDW 13.0 11.5 - 15.5 %   Platelets 244 150 - 400 K/uL  ABO/Rh     Status: None (Preliminary result)   Collection Time:  06/23/16  2:54 PM  Result Value Ref Range   ABO/RH(D) A POS    Dg Foot Complete Right  Result Date: 06/23/2016 CLINICAL DATA:  Pt lacerated right foot with chainsaw today while cutting down a tree. EXAM: RIGHT FOOT COMPLETE - 3+ VIEW COMPARISON:  None. FINDINGS: There is irregular disruption of the dorsal cortex of the mid shaft of the first metatarsal with overlying soft tissue air, swelling and multiple small radiopaque bone fragments. No convincing metallic foreign body. No complete fracture. The joints are normally spaced and aligned. IMPRESSION: 1. Chainsaw injury with disruption along the dorsal cortex of the midshaft of the first metatarsal without a complete fracture. There is an associated soft tissue laceration with soft tissue air and multiple small soft tissue bone fragments. Electronically Signed   By: Lajean Manes M.D.   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 Bleeding laceration to right foot Unable to extend 1st and 2nd toes on the right foot  Physical Exam: Alert and appropriate 56 y/o male in no acute distress So signs of injury to upper extremities Right foot: actively bleeding laceration to dorsal foot with exposed tendon edges Pt able to flex all of his toes and has good sensation distally Antalgic gait  Physical Exam   Assessment/Plan Assessment: right foot laceration with extensor tendon damage  Plan: Plan to admit pt for surgical management of the right foot with possible extensor repair Pt has been NPO since early this morning Keep NPO Pt has had ancef and will have gentamycin in the OR Tetanus up to date

## 2016-06-23 NOTE — Progress Notes (Signed)
Pt admitted to 5N-14. Accompanied by PACU RNs. Pt stable at time of admission. Wife now at bedside.

## 2016-06-24 ENCOUNTER — Encounter (HOSPITAL_COMMUNITY): Payer: Self-pay | Admitting: Orthopedic Surgery

## 2016-06-24 LAB — PROTIME-INR
INR: 3.81
Prothrombin Time: 38.5 seconds — ABNORMAL HIGH (ref 11.4–15.2)

## 2016-06-24 MED ORDER — DOCUSATE SODIUM 100 MG PO CAPS: 100.0000 mg | ORAL_CAPSULE | Freq: Two times a day (BID) | ORAL | 0 refills | Status: DC

## 2016-06-24 MED ORDER — GABAPENTIN 400 MG PO CAPS
800.0000 mg | ORAL_CAPSULE | Freq: Two times a day (BID) | ORAL | Status: DC
  Administered 2016-06-24: 800 mg via ORAL
  Filled 2016-06-24: qty 2

## 2016-06-24 MED ORDER — AMOXICILLIN-POT CLAVULANATE 875-125 MG PO TABS: 1.0000 | ORAL_TABLET | Freq: Two times a day (BID) | ORAL | 0 refills | Status: DC

## 2016-06-24 MED ORDER — WARFARIN - PHARMACIST DOSING INPATIENT: Freq: Every day | Status: DC

## 2016-06-24 MED ORDER — OXYCODONE HCL 5 MG PO TABS: 5.0000 mg | ORAL_TABLET | ORAL | 0 refills | Status: DC | PRN

## 2016-06-24 NOTE — Care Management Note (Signed)
Case Management Note  Patient Details  Name: IZAYAH MINER MRN: [id number] Date of Birth: [birth year]  Subjective/Objective:      Laceration of extensor tendon of right foot              Action/Plan: Discharge Planning: Referral for RW. Contacted [facility] DME rep for RW for home.   PCP [name] MD  Expected Discharge Date:  06/24/16               Expected Discharge Plan:  Home/Self Care  In-House Referral:  NA  Discharge planning Services  CM Consult  Post Acute Care Choice:  NA Choice offered to:  NA  DME Arranged:  [place] DME Agency:  [place]:  NA [place] Agency:  NA  Status of Service:  Completed, signed off  If discussed at [place] of Stay Meetings, dates discussed:    Additional Comments:  Erenest Rasher, RN 06/24/2016, 6:17 PM

## 2016-06-24 NOTE — Progress Notes (Signed)
   Subjective:  Patient reports pain as mild.  No c/o.  Objective:   VITALS:   Vitals:   06/23/16 1932 06/23/16 1939 06/23/16 2024 06/24/16 0700  BP: 119/69  118/66 (!) 105/57  Pulse:   81 71  Resp:      Temp:   98.2 F (36.8 C) 98.3 F (36.8 C)  TempSrc:   Oral Oral  SpO2:   100% 97%  Weight:  85.7 kg (189 lb)    Height:  5\' 11"  (1.803 m)      NAD ABD soft Incision: dressing C/D/I BCR toes No active extension as expected  Lab Results  Component Value Date   WBC 5.7 06/23/2016   HGB 10.5 (L) 06/23/2016   HCT 31.0 (L) 06/23/2016   MCV 88.1 06/23/2016   PLT 244 06/23/2016   BMET    Component Value Date/Time   NA 138 06/23/2016 2048   K 4.1 06/23/2016 2048   CL 109 06/23/2016 2048   CO2 22 06/23/2016 2048   GLUCOSE 123 (H) 06/23/2016 2048   BUN 17 06/23/2016 2048   CREATININE 0.88 06/23/2016 2048   CREATININE 0.75 02/23/2015 0001   CALCIUM 7.7 (L) 06/23/2016 2048   GFRNONAA >60 06/23/2016 2048   GFRAA >60 06/23/2016 2048     Assessment/Plan: 1 Day Post-Op   Active Problems:   Laceration of extensor tendon of right foot   D/C home today with PO abx F/U with Dr. Doran Durand tomorrow   Keyaira Clapham, Tarun Patchell 06/24/2016, 9:34 AM   Rod Can, MD Cell [phone]

## 2016-06-24 NOTE — Evaluation (Signed)
Physical Therapy Evaluation/Discharge Patient Details Name: Robert [name] MRN: [id number] DOB: 11/02/1960 Today's Date: 06/24/2016   History of Present Illness  Pt is a 56 yo male admitted through ED on 06/23/16 following an accident where he dropped a chainsaw on his left foot.  Pt sustained tendon lacerations and went in for a surgical debridement on 06/23/16 and will undergo another surgery to repair tendons. PMH significant for aortic valve replacement 2007, CVA, DVT, subacute bacterial endocarditis and peripheral neuropathy.   Clinical Impression  Pt is POD 1 following the above procedure. Prior to admission, pt was completely independent and working full time as an Art gallery manager. Lives with his wife and their 8 boys. Pt is able to perform all mobility at a min guard to supervision level and is expected to improve with mobility as he becomes more comfortable with RW. Pt is to see his surgeon again tomorrow and have another surgery on Thursday. All education was complete and questions were answered this session. No further acute therapy services required at this time. Please re-order if any changes are noted prior to discharge.     Follow Up Recommendations No PT follow up    Equipment Recommendations  Rolling walker with 5" wheels    Recommendations for Other Services       Precautions / Restrictions Precautions Precautions: Fall Restrictions Weight Bearing Restrictions: Yes RLE Weight Bearing: Non weight bearing Other Position/Activity Restrictions: none listed in orders, kept pt NWB      Mobility  Bed Mobility Overal bed mobility: Independent;Needs Assistance Bed Mobility: Supine to Sit;Sit to Supine     Supine to sit: Min guard Sit to supine: Independent   General bed mobility comments: Min guard to initally sit EOB, but able to get back into bed without assistance  Transfers Overall transfer level: Needs assistance Equipment used: Crutches;Rolling walker (2  wheeled) Transfers: Sit to/from Stand Sit to Stand: Min guard         General transfer comment: Min guard for safety and cues for hand placement  Ambulation/Gait Ambulation/Gait assistance: Modified independent (Device/Increase time) Ambulation Distance (Feet): 150 Feet Assistive device: Rolling walker (2 wheeled) Gait Pattern/deviations: Step-to pattern Gait velocity: decreased Gait velocity interpretation: Below normal speed for age/gender General Gait Details: good step length with LLE, becomes fatigued after gait. Cues for sequencing. attempted gait with crutches initally and then downgraded to RW where pt was much more comfortable.   Stairs            Wheelchair Mobility    Modified Rankin (Stroke Patients Only)       Balance Overall balance assessment: Needs assistance Sitting-balance support: No upper extremity supported;Feet unsupported Sitting balance-Leahy Scale: Normal     Standing balance support: Single extremity supported;During functional activity Standing balance-Leahy Scale: Fair                               Pertinent Vitals/Pain Pain Assessment: 0-10 Pain Score: 3  Pain Location: right foot Pain Descriptors / Indicators: Aching;Pressure;Throbbing;Sharp;Shooting Pain Intervention(s): Monitored during session;Premedicated before session    [place] expects to be discharged to:: Private residence Living Arrangements: Spouse/significant other Available Help at Discharge: Family;Available 24 hours/day Type of Home: House Home Access: Stairs to enter Entrance Stairs-Rails: None Entrance Stairs-Number of Steps: 1 Home Layout: One level Home Equipment: Crutches      Prior Function Level of Independence: Independent  Comments: was cutting down a tree and droppped the chainsaw on his foot     Hand Dominance   Dominant Hand: Right    Extremity/Trunk Assessment   Upper Extremity Assessment Upper  Extremity Assessment: Overall WFL for tasks assessed    Lower Extremity Assessment Lower Extremity Assessment: RLE deficits/detail RLE Deficits / Details: overall 4/5 knee and hip. Did not assess ankle RLE: Unable to fully assess due to pain;Unable to fully assess due to immobilization       Communication   Communication: No difficulties  Cognition Arousal/Alertness: Awake/alert Behavior During Therapy: WFL for tasks assessed/performed Overall Cognitive Status: Within Functional Limits for tasks assessed                                        General Comments      Exercises     Assessment/Plan    PT Assessment Patent does not need any further PT services  PT Problem List         PT Treatment Interventions      PT Goals (Current goals can be found in the Care Plan section)  Acute Rehab PT Goals Patient Stated Goal: to get back on his feet PT Goal Formulation: With patient/family Time For Goal Achievement: 07/01/16 Potential to Achieve Goals: Good    Frequency     Barriers to discharge        Co-evaluation               End of Session Equipment Utilized During Treatment: Gait belt Activity Tolerance: Patient tolerated treatment well Patient left: in bed;with call bell/phone within reach;with family/visitor present Nurse Communication: Mobility status PT Visit Diagnosis: Difficulty in walking, not elsewhere classified (R26.2)    Time: 7893-8101 PT Time Calculation (min) (ACUTE ONLY): 29 min   Charges:   PT Evaluation $PT Eval Moderate Complexity: 1 Procedure PT Treatments $Gait Training: 8-22 mins   PT G Codes:   PT G-Codes **NOT FOR INPATIENT CLASS** Functional Assessment Tool Used: AM-PAC 6 Clicks Basic Mobility;Clinical judgement Functional Limitation: Mobility: Walking and moving around Mobility: Walking and Moving Around Current Status (B5102): At least 20 percent but less than 40 percent impaired, limited or  restricted Mobility: Walking and Moving Around Goal Status [phone]): At least 20 percent but less than 40 percent impaired, limited or restricted Mobility: Walking and Moving Around Discharge Status [phone]): At least 20 percent but less than 40 percent impaired, limited or restricted    Scheryl Marten PT, DPT  [phone]   Shanon Rosser 06/24/2016, 3:52 PM

## 2016-06-24 NOTE — Progress Notes (Signed)
D/C instructions provided to patient and spouse.  Patient denies question/concerns at this time. Patient transport to front entrance via [place] by this RN, tol well.

## 2016-06-24 NOTE — Progress Notes (Signed)
ANTICOAGULATION CONSULT NOTE - Initial Consult  Pharmacy Consult for Coumadin Indication: atrial fibrillation  Allergies  Allergen Reactions  . Tape Rash and Other (See Comments)    Inflammation and TEARS OFF THE SKIN!!    Patient Measurements: Height: 5\' 11"  (180.3 cm) Weight: 189 lb (85.7 kg) IBW/kg (Calculated) : 75.3  Vital Signs: Temp: 98.3 F (36.8 C) (04/01 0700) Temp Source: Oral (04/01 0700) BP: 105/57 (04/01 0700) Pulse Rate: 71 (04/01 0700)  Assessment: 56 yo M presents on Coumadin 7.5mg  daily exc 5mg  on Mon for AFib and AVR. INR goal 2.5-3.5 per anticoag notes. Last dose was taken on 3/31 at home. INR on admit was 2.75. INR today elevated at 3.81. Hgb 10.5, plts wnl.  Goal of Therapy:  INR 2.5-3.5 Monitor platelets by anticoagulation protocol: Yes   Plan:  Hold Coumadin today Monitor daily INR, CBC, s/s of bleed  Logann Whitebread J 06/24/2016,8:22 AM

## 2016-06-24 NOTE — Discharge Summary (Signed)
Physician Discharge Summary  Patient ID: Robert [name] MRN: [id number] DOB/AGE: 55-01-1961 55 y.o.  Admit date: 06/23/2016 Discharge date: 06/24/2016  Admission Diagnoses:  Laceration of extensor tendon of right foot  Discharge Diagnoses:  Principal Problem:   Laceration of extensor tendon of right foot   Past Medical History:  Diagnosis Date  . Aneurysm of ascending aorta (HCC)   . Blood transfusion without reported diagnosis   . Cataract    BEGINNING  . CVA (cerebral infarction)   . DVT (deep venous thrombosis) ([place])   . Hx of adenomatous colonic polyps 05/11/2014  . Hyperlipidemia   . Migraine headache   . Renal stone   . S/P aortic valve replacement   . Stroke ([place])   . Subacute bacterial endocarditis ([facility])     Surgeries: Procedure(s): IRRIGATION AND DEBRIDEMENT FOOT TENDON REPAIR on 06/23/2016   Consultants (if any):   Discharged Condition: Improved  Hospital Course: DAEVEON ZWEBER is an 56 y.o. male who was admitted 06/23/2016 with a diagnosis of Laceration of extensor tendon of right foot and went to the operating room on 06/23/2016 and underwent the above named procedures.    He was given perioperative antibiotics:  Anti-infectives    Start     Dose/Rate Route Frequency Ordered Stop   06/24/16 0000  amoxicillin-clavulanate (AUGMENTIN) 875-125 MG tablet     1 tablet Oral 2 times daily 06/24/16 0938     06/23/16 2000  ceFAZolin (ANCEF) IVPB 2g/100 mL premix     2 g 200 mL/hr over 30 Minutes Intravenous Every 6 hours 06/23/16 1955 06/24/16 1359   06/23/16 1955  amoxicillin (AMOXIL) capsule 2,000 mg  Status:  Discontinued    Comments:  ONE HOUR PRIOR TO DENTAL APPOINTMENTS AS A "PRE-TREATMENT"     2,000 mg Oral See admin instructions 06/23/16 1955 06/23/16 1959   06/23/16 1700  gentamicin (GARAMYCIN) 170 mg in dextrose 5 % 50 mL IVPB     2 mg/kg  85.7 kg 108.5 mL/hr over 30 Minutes Intravenous  Once 06/23/16 1639 06/23/16 1839   06/23/16 1500  ceFAZolin (ANCEF) IVPB  2g/100 mL premix     2 g 200 mL/hr over 30 Minutes Intravenous  Once 06/23/16 1457 06/23/16 1555    .  He was given sequential compression devices, early ambulation, and coumadin for DVT prophylaxis.  He benefited maximally from the hospital stay and there were no complications.    Recent vital signs:  Vitals:   06/23/16 2024 06/24/16 0700  BP: 118/66 (!) 105/57  Pulse: 81 71  Resp:    Temp: 98.2 F (36.8 C) 98.3 F (36.8 C)    Recent laboratory studies:  Lab Results  Component Value Date   HGB 10.5 (L) 06/23/2016   HGB 15.4 06/23/2016   HGB 15.7 02/23/2015   Lab Results  Component Value Date   WBC 5.7 06/23/2016   PLT 244 06/23/2016   Lab Results  Component Value Date   INR 3.81 06/24/2016   Lab Results  Component Value Date   NA 138 06/23/2016   K 4.1 06/23/2016   CL 109 06/23/2016   CO2 22 06/23/2016   BUN 17 06/23/2016   CREATININE 0.88 06/23/2016   GLUCOSE 123 (H) 06/23/2016    Discharge Medications:   Allergies as of 06/24/2016      Reactions   Tape Rash, Other (See Comments)   Inflammation and TEARS OFF THE SKIN!!      Medication List    STOP taking these  medications   warfarin 5 MG tablet Commonly known as:  COUMADIN     TAKE these medications   acetaminophen 325 MG tablet Commonly known as:  TYLENOL Take 325-650 mg by mouth every 6 (six) hours as needed (for pain or headaches).   amoxicillin 500 MG capsule Commonly known as:  AMOXIL Take 2,000 mg by mouth See admin instructions. ONE HOUR PRIOR TO DENTAL APPOINTMENTS AS A "PRE-TREATMENT"   amoxicillin-clavulanate 875-125 MG tablet Commonly known as:  AUGMENTIN Take 1 tablet by mouth 2 (two) times daily.   atorvastatin 10 MG tablet Commonly known as:  LIPITOR Take 1 tablet (10 mg total) by mouth daily.   atorvastatin 10 MG tablet Commonly known as:  LIPITOR TAKE 1 TABLET (10 MG TOTAL) BY MOUTH DAILY.   docusate sodium 100 MG capsule Commonly known as:  COLACE Take 1 capsule  (100 mg total) by mouth 2 (two) times daily.   gabapentin 800 MG tablet Commonly known as:  NEURONTIN TAKE 1 TABLET TWICE A DAY What changed:  See the new instructions.   oxyCODONE 5 MG immediate release tablet Commonly known as:  Oxy IR/ROXICODONE Take 1-2 tablets (5-10 mg total) by mouth every 3 (three) hours as needed for breakthrough pain.       Diagnostic Studies: Dg Foot Complete Right  Result Date: 06/23/2016 CLINICAL DATA:  Pt lacerated right foot with chainsaw today while cutting down a tree. EXAM: RIGHT FOOT COMPLETE - 3+ VIEW COMPARISON:  None. FINDINGS: There is irregular disruption of the dorsal cortex of the mid shaft of the first metatarsal with overlying soft tissue air, swelling and multiple small radiopaque bone fragments. No convincing metallic foreign body. No complete fracture. The joints are normally spaced and aligned. IMPRESSION: 1. Chainsaw injury with disruption along the dorsal cortex of the midshaft of the first metatarsal without a complete fracture. There is an associated soft tissue laceration with soft tissue air and multiple small soft tissue bone fragments. Electronically Signed   By: Lajean Manes M.D.   On: 06/23/2016 15:32    Disposition: 01-Home or Self Care  Discharge Instructions    Call MD / Call 911    Complete by:  As directed    If you experience chest pain or shortness of breath, CALL 911 and be transported to the hospital emergency room.  If you develope a fever above 101 F, pus (white drainage) or increased drainage or redness at the wound, or calf pain, call your surgeon's office.   Constipation Prevention    Complete by:  As directed    Drink plenty of fluids.  Prune juice may be helpful.  You may use a stool softener, such as Colace (over the counter) 100 mg twice a day.  Use MiraLax (over the counter) for constipation as needed.   Diet - low sodium heart healthy    Complete by:  As directed    Driving restrictions    Complete by:  As  directed    No driving   Increase activity slowly as tolerated    Complete by:  As directed    Lifting restrictions    Complete by:  As directed    No lifting      Follow-up Information    HEWITT, JOHN, MD. Schedule an appointment as soon as possible for a visit in 1 day.   Specialty:  Orthopedic Surgery Why:  Monday with Dr Doran Durand.  please call [phone] Monday at 8 am to get time to get  in Contact information: [address] [place] [address] [phone]            Signed: Demar, Shad 06/24/2016, 9:39 AM

## 2016-06-25 ENCOUNTER — Telehealth: Payer: Self-pay | Admitting: Cardiology

## 2016-06-25 ENCOUNTER — Ambulatory Visit (INDEPENDENT_AMBULATORY_CARE_PROVIDER_SITE_OTHER): Payer: 59 | Admitting: *Deleted

## 2016-06-25 DIAGNOSIS — I6930 Unspecified sequelae of cerebral infarction: Secondary | ICD-10-CM | POA: Diagnosis not present

## 2016-06-25 DIAGNOSIS — I359 Nonrheumatic aortic valve disorder, unspecified: Secondary | ICD-10-CM | POA: Diagnosis not present

## 2016-06-25 DIAGNOSIS — Z952 Presence of prosthetic heart valve: Secondary | ICD-10-CM

## 2016-06-25 DIAGNOSIS — I6350 Cerebral infarction due to unspecified occlusion or stenosis of unspecified cerebral artery: Secondary | ICD-10-CM | POA: Diagnosis not present

## 2016-06-25 DIAGNOSIS — Z7901 Long term (current) use of anticoagulants: Secondary | ICD-10-CM

## 2016-06-25 LAB — POCT INR: INR: 2.5

## 2016-06-25 MED ORDER — ENOXAPARIN SODIUM 80 MG/0.8ML ~~LOC~~ SOLN: 80.0000 mg | Freq: Two times a day (BID) | SUBCUTANEOUS | 1 refills | Status: DC

## 2016-06-25 NOTE — Telephone Encounter (Signed)
Pt states that he was advised by hospital to stop coumadin 5 days ahead of procedure on Thursday to reattach tendons in foot. He states he does not currently have a Psychologist, sport and exercise nor has he seen a Psychologist, sport and exercise in consultation for this surgery. He states he is pursing surgery at [facility] and with Dr. Doran Durand through [facility]. He did stop his coumadin yesterday.   He has a history of stroke and should be bridged with lovenox any time he comes off for procedure.   Of note pt overdue for follow up with Coumadin clinic. Appt scheduled for this afternoon.

## 2016-06-25 NOTE — Telephone Encounter (Signed)
New message    Pt is calling asking for lovenox because he quit taking his coumadin yesterday. He discontinued it because he is going to have surgery possibly Thursday on his foot.

## 2016-06-25 NOTE — Telephone Encounter (Signed)
Will forward to CVRR. 

## 2016-06-25 NOTE — Op Note (Signed)
NAME:  ,                                 ACCOUNT NO.:  MEDICAL RECORD NO.:  [id number]  LOCATION:                                 FACILITY:  PHYSICIAN:  Doran Heater. Veverly Fells, M.D.      DATE OF BIRTH:  DATE OF PROCEDURE:  06/23/2016 DATE OF DISCHARGE:                              OPERATIVE REPORT   PREOPERATIVE DIAGNOSIS:  Chainsaw injury with tendon and bone involvement, right foot.  POSTOPERATIVE DIAGNOSIS:  Chainsaw injury with tendon and bone involvement, right foot.  PROCEDURE PERFORMED:  Irrigation and debridement of right foot chainsaw injury.  ATTENDING SURGEON:  Doran Heater. Veverly Fells, MD.  ASSISTANT:  None.  ANESTHESIA:  General anesthesia was used.  ESTIMATED BLOOD LOSS:  100 mL.  FLUID REPLACEMENT:  1000 mL crystalloid as well as 500 mL albumin.  INSTRUMENT COUNTS:  Correct.  COMPLICATIONS:  None.  ANTIBIOTICS:  Perioperative antibiotics were given.  INDICATIONS:  The patient is a 56 year old male suffered a chainsaw injury to the dorsal aspect of his right foot involving complex laceration over a 5 x 10 cm area as well as involvement of tendon and bone.  There was obvious severe bleeding coming from the foot.  After initial brief evaluation in emergency department, we determined that the patient need to go to the operating room for better control including tourniquet and irrigation debridement, possible tendon repair based on what we saw.  Risks and benefits of surgery were discussed in detail with the patient.  Informed consent obtained.  DESCRIPTION OF PROCEDURE:  After an adequate level of anesthesia achieved, the patient was placed supine on the operating room table. Nonsterile tourniquet placed on the proximal calf.  We elevated the leg and exsanguinated the leg just with elevation and after a time-out was called, we went ahead and elevated the tourniquet even prior to sterile prep and drape, just to control bleeding.  Once we had the tourniquet elevated,  we sterilely prepped and draped the lower leg.  There was clear complex laceration involving a 5 x 10 cm aspect of the dorsum of the foot involving tendon and bone.  Once we had our sterile prep and drape performed, we irrigated using pulse irrigation, 3 L normal saline. We removed multiple bone fragments.  The dorsal aspect of the great toe metatarsal head out of it with some exposed marrow; however, the bone, itself, was intact.  There was also bone missing from the dorsal aspect of the second metatarsal.  The EHL was definitely torn with the proximal extents retracted past the ankle retinaculum.  He was able to use the [employer] and with multiple passes not able to retrieve the tendon, which had retracted proximally.  The second toe extensor tendon was also lacerated as was there was a partial laceration of the third toe extensor tendon.  We were able to tag all those with Prolene, was able to remove all debris from the wound.  There really was not much debris at all just some organized hematoma.  Once we had everything clean with 3 L normal saline irrigation and  the tendons tagged, I realized this would be a complex tendon repair situation, best left for a board-certified and fellowship trained foot and ankle specialist and at this point, we went ahead and did a primary skin repair with interrupted nylon suture, trying to decrease the size of the wound.  We were able to get this shrunk down to about a 2 x 4 cm or 2 x 3 cm wound with just side-to-side repairs with the nylon.  We had dropped the tourniquet prior to our closing to make sure we had good hemostasis. Multiple venous bleeders had been ligated.  We definitely felt like we had the bleeding much better controlled and then applied an Adaptic dressing and then a 4x4s, ABD, and Kerlix bandage with Coban on top. The patient was transported to the recovery room in stable condition. We will be doing prophylactic  antibiotics for 24 hours.     Doran Heater. Veverly Fells, M.D.     SRN/MEDQ  D:  06/23/2016  T:  06/23/2016  Job:  [id number]

## 2016-06-26 DIAGNOSIS — S96129D Laceration of muscle and tendon of long extensor muscle of toe at ankle and foot level, unspecified foot, subsequent encounter: Secondary | ICD-10-CM | POA: Diagnosis not present

## 2016-06-26 DIAGNOSIS — S91311A Laceration without foreign body, right foot, initial encounter: Secondary | ICD-10-CM | POA: Diagnosis not present

## 2016-06-26 NOTE — Patient Instructions (Signed)
After several phone calls with both patient and potential surgeon's office date for procedure still undetermined. Pt would prefer to "bridge" with lovenox in case procedure later this week as resuming warfarin may postpone any procedure. He will begin lovenox 80mg  Q12H this evening as he has now been off Coumadin on Sunday and yesterday. Pt aware that this could mean he will be on lovenox for several weeks. He has done lovenox in past and verbalized instructions for injections.   LM for Dr. Baruch Merl Scheduler Rosann Auerbach [place] [phone] to call with procedure date. Pt also aware to call as soon as date determined.

## 2016-06-26 NOTE — Telephone Encounter (Signed)
Yesterday called and confirmed that pt did NOT have appt with Dr. Doran Durand for surgery on Thursday. Per pt during appt that meant that he has been set up with referral to [facility] for more immediate surgery, pt stated he was going to call us once he arrived home to confirm surgery details as patient.

## 2016-06-27 NOTE — Telephone Encounter (Signed)
Spoke to Robert [name] about Dr. Andree Elk recommendation to go back on Coumadin. Will restart warfarin this evening with 2 tablets today and tomorrow then resume usual dose. Remain on Lovenox until INR check Monday since he has missed 3 days of warfarin at this point. Pt states understanding and will continue both until Monday.

## 2016-06-27 NOTE — Telephone Encounter (Signed)
Rosann Auerbach called from [place] (Dr. Andree Elk orthopedic surgeon's office) about procedure. She states that it will be at least a few weeks before the patient is scheduled for procedure and that per Dr. Andree Elk patient should resume coumadin in the meantime.

## 2016-06-28 ENCOUNTER — Telehealth: Payer: Self-pay | Admitting: Pharmacist

## 2016-06-28 DIAGNOSIS — S91311D Laceration without foreign body, right foot, subsequent encounter: Secondary | ICD-10-CM | POA: Diagnosis not present

## 2016-06-28 NOTE — Telephone Encounter (Signed)
Pt's wife called about lovenox and coumadin. She states she was unsure if he should remain on both. Advised to continue both until appt on Monday. She stated that she only gave him 7.5mg  last night despite being told to give him 10mg . Advised for him to take 10mg  tonight as directed to hopefully boost INR into range. She states understanding and appreciation.

## 2016-07-02 ENCOUNTER — Ambulatory Visit (INDEPENDENT_AMBULATORY_CARE_PROVIDER_SITE_OTHER): Payer: 59 | Admitting: *Deleted

## 2016-07-02 DIAGNOSIS — Z7901 Long term (current) use of anticoagulants: Secondary | ICD-10-CM | POA: Diagnosis not present

## 2016-07-02 DIAGNOSIS — I359 Nonrheumatic aortic valve disorder, unspecified: Secondary | ICD-10-CM

## 2016-07-02 DIAGNOSIS — I6930 Unspecified sequelae of cerebral infarction: Secondary | ICD-10-CM

## 2016-07-02 DIAGNOSIS — Z952 Presence of prosthetic heart valve: Secondary | ICD-10-CM | POA: Diagnosis not present

## 2016-07-02 DIAGNOSIS — I6350 Cerebral infarction due to unspecified occlusion or stenosis of unspecified cerebral artery: Secondary | ICD-10-CM | POA: Diagnosis not present

## 2016-07-02 LAB — POCT INR: INR: 2.1

## 2016-07-03 DIAGNOSIS — S91311D Laceration without foreign body, right foot, subsequent encounter: Secondary | ICD-10-CM | POA: Diagnosis not present

## 2016-07-03 DIAGNOSIS — S96129D Laceration of muscle and tendon of long extensor muscle of toe at ankle and foot level, unspecified foot, subsequent encounter: Secondary | ICD-10-CM | POA: Diagnosis not present

## 2016-07-05 DIAGNOSIS — S91301A Unspecified open wound, right foot, initial encounter: Secondary | ICD-10-CM | POA: Insufficient documentation

## 2016-07-06 ENCOUNTER — Telehealth: Payer: Self-pay

## 2016-07-06 DIAGNOSIS — S96129D Laceration of muscle and tendon of long extensor muscle of toe at ankle and foot level, unspecified foot, subsequent encounter: Secondary | ICD-10-CM | POA: Insufficient documentation

## 2016-07-06 NOTE — Telephone Encounter (Signed)
Pt called states he is scheduled for surgery at [facility] on 07/11/16.  Pt has hx of CVA and will need to be bridged with Lovenox while holding Coumadin prior to surgery per previous phone notes 06/28/16 in [place].  Pt will need to hold Coumadin x 5 days prior to surgery, last dosage of Coumadin 07/05/16.    07/05/16: Last dose of Coumadin.  07/06/16: No Coumadin or Lovenox.  07/07/16: Inject Lovenox 80mg  in the fatty abdominal tissue at least 2 inches from the belly button twice a day about 12 hours apart, 8am and 8pm rotate sites. No Coumadin.  07/08/16: Inject Lovenox in the fatty tissue every 12 hours, 8am and 8pm. No Coumadin.  07/09/16: Inject Lovenox in the fatty tissue every 12 hours, 8am and 8pm. No Coumadin.  07/10/16: Inject Lovenox in the fatty tissue in the morning at 8 am (No PM dose). No Coumadin.  07/11/16: Procedure Day - No Lovenox - Resume Coumadin in the evening or as directed by doctor (take an extra half tablet with usual dose for 2 days then resume normal dose).  07/12/16: Resume Lovenox inject in the fatty tissue every 12 hours and take Coumadin.  07/13/16: Inject Lovenox in the fatty tissue every 12 hours and take Coumadin.  07/14/16: Inject Lovenox in the fatty tissue every 12 hours and take Coumadin.  07/15/16: Inject Lovenox in the fatty tissue every 12 hours and take Coumadin.  07/16/16: Inject Lovenox in the fatty tissue every 12 hours and take Coumadin.  07/17/16: Coumadin appt to check INR.  Called pt gave verbal instructions as outlined above, rescheduled pt's appt from 07/09/16 to 07/17/16.  Pt verbalized understanding, will call back if any questions arise.

## 2016-07-09 DIAGNOSIS — S96129D Laceration of muscle and tendon of long extensor muscle of toe at ankle and foot level, unspecified foot, subsequent encounter: Secondary | ICD-10-CM | POA: Diagnosis not present

## 2016-07-09 DIAGNOSIS — R001 Bradycardia, unspecified: Secondary | ICD-10-CM | POA: Diagnosis not present

## 2016-07-11 DIAGNOSIS — S91311A Laceration without foreign body, right foot, initial encounter: Secondary | ICD-10-CM | POA: Diagnosis not present

## 2016-07-11 DIAGNOSIS — W293XXD Contact with powered garden and outdoor hand tools and machinery, subsequent encounter: Secondary | ICD-10-CM | POA: Diagnosis not present

## 2016-07-11 DIAGNOSIS — S96121A Laceration of muscle and tendon of long extensor muscle of toe at ankle and foot level, right foot, initial encounter: Secondary | ICD-10-CM | POA: Diagnosis not present

## 2016-07-11 DIAGNOSIS — S91311D Laceration without foreign body, right foot, subsequent encounter: Secondary | ICD-10-CM | POA: Diagnosis not present

## 2016-07-11 DIAGNOSIS — S96821A Laceration of other specified muscles and tendons at ankle and foot level, right foot, initial encounter: Secondary | ICD-10-CM | POA: Diagnosis not present

## 2016-07-17 DIAGNOSIS — I38 Endocarditis, valve unspecified: Secondary | ICD-10-CM | POA: Diagnosis not present

## 2016-07-17 DIAGNOSIS — S91301D Unspecified open wound, right foot, subsequent encounter: Secondary | ICD-10-CM | POA: Diagnosis not present

## 2016-07-18 ENCOUNTER — Ambulatory Visit (INDEPENDENT_AMBULATORY_CARE_PROVIDER_SITE_OTHER): Payer: 59 | Admitting: *Deleted

## 2016-07-18 DIAGNOSIS — Z7901 Long term (current) use of anticoagulants: Secondary | ICD-10-CM

## 2016-07-18 DIAGNOSIS — I359 Nonrheumatic aortic valve disorder, unspecified: Secondary | ICD-10-CM | POA: Diagnosis not present

## 2016-07-18 DIAGNOSIS — Z952 Presence of prosthetic heart valve: Secondary | ICD-10-CM

## 2016-07-18 DIAGNOSIS — I6930 Unspecified sequelae of cerebral infarction: Secondary | ICD-10-CM

## 2016-07-18 DIAGNOSIS — I6350 Cerebral infarction due to unspecified occlusion or stenosis of unspecified cerebral artery: Secondary | ICD-10-CM | POA: Diagnosis not present

## 2016-07-18 LAB — POCT INR: INR: 1.9

## 2016-07-25 ENCOUNTER — Ambulatory Visit (INDEPENDENT_AMBULATORY_CARE_PROVIDER_SITE_OTHER): Payer: 59 | Admitting: *Deleted

## 2016-07-25 DIAGNOSIS — Z952 Presence of prosthetic heart valve: Secondary | ICD-10-CM

## 2016-07-25 DIAGNOSIS — I359 Nonrheumatic aortic valve disorder, unspecified: Secondary | ICD-10-CM | POA: Diagnosis not present

## 2016-07-25 DIAGNOSIS — I6930 Unspecified sequelae of cerebral infarction: Secondary | ICD-10-CM | POA: Diagnosis not present

## 2016-07-25 DIAGNOSIS — Z7901 Long term (current) use of anticoagulants: Secondary | ICD-10-CM

## 2016-07-25 DIAGNOSIS — I6350 Cerebral infarction due to unspecified occlusion or stenosis of unspecified cerebral artery: Secondary | ICD-10-CM | POA: Diagnosis not present

## 2016-07-25 LAB — POCT INR: INR: 2.9

## 2016-08-07 ENCOUNTER — Other Ambulatory Visit: Payer: Self-pay | Admitting: Cardiology

## 2016-08-15 ENCOUNTER — Ambulatory Visit (INDEPENDENT_AMBULATORY_CARE_PROVIDER_SITE_OTHER): Payer: 59 | Admitting: *Deleted

## 2016-08-15 DIAGNOSIS — I6350 Cerebral infarction due to unspecified occlusion or stenosis of unspecified cerebral artery: Secondary | ICD-10-CM | POA: Diagnosis not present

## 2016-08-15 DIAGNOSIS — I359 Nonrheumatic aortic valve disorder, unspecified: Secondary | ICD-10-CM | POA: Diagnosis not present

## 2016-08-15 DIAGNOSIS — Z952 Presence of prosthetic heart valve: Secondary | ICD-10-CM | POA: Diagnosis not present

## 2016-08-15 DIAGNOSIS — Z7901 Long term (current) use of anticoagulants: Secondary | ICD-10-CM

## 2016-08-15 DIAGNOSIS — I6930 Unspecified sequelae of cerebral infarction: Secondary | ICD-10-CM | POA: Diagnosis not present

## 2016-08-15 LAB — POCT INR: INR: 3.9

## 2016-08-21 ENCOUNTER — Ambulatory Visit (INDEPENDENT_AMBULATORY_CARE_PROVIDER_SITE_OTHER): Payer: 59 | Admitting: Pharmacist

## 2016-08-21 DIAGNOSIS — Z952 Presence of prosthetic heart valve: Secondary | ICD-10-CM

## 2016-08-21 DIAGNOSIS — Z7901 Long term (current) use of anticoagulants: Secondary | ICD-10-CM | POA: Diagnosis not present

## 2016-08-21 DIAGNOSIS — I6350 Cerebral infarction due to unspecified occlusion or stenosis of unspecified cerebral artery: Secondary | ICD-10-CM

## 2016-08-21 DIAGNOSIS — I359 Nonrheumatic aortic valve disorder, unspecified: Secondary | ICD-10-CM

## 2016-08-21 LAB — POCT INR: INR: 2.8

## 2016-08-21 MED ORDER — ENOXAPARIN SODIUM 80 MG/0.8ML ~~LOC~~ SOLN: 80.0000 mg | Freq: Two times a day (BID) | SUBCUTANEOUS | 0 refills | Status: DC

## 2016-08-21 NOTE — Patient Instructions (Addendum)
Current dose: 1 and  tablets every day (7.5mg ), except 1 tablet on Fridays (5mg ).  08/21/16: Last dose of Coumadin.   08/22/16: No Coumadin or Lovenox.   08/23/16: Inject Lovenox 80mg  in the fatty abdominal tissue at least 2 inches from the belly button twice a day about 12 hours apart, 8am and 8pm rotate sites. No Coumadin.   08/24/16: Inject Lovenox 80mg  every 12 hours, 8am and 8pm. No Coumadin.   08/25/16: Inject Lovenox 80mg  every 12 hours, 8am and 8pm. No Coumadin.   08/26/16: Inject Lovenox in the morning at 8 am (No dose in the evening). No Coumadin.   08/27/16: Procedure Day - No Lovenox - Resume Coumadin in the evening or as directed by doctor. Take 2 tablets (10mg ) of Coumadin.   08/28/16: Resume Lovenox inject in the fatty tissue every 12 hours and take 2 tablets (10mg ) of Coumadin.   08/29/16: Inject Lovenox in the fatty tissue every 12 hours and take 1 and  tablets of Coumadin.   08/30/16: Inject Lovenox in the fatty tissue every 12 hours and take 1 and  tablets of Coumadin.   08/31/16: Inject Lovenox in the fatty tissue every 12 hours and take 1 tablet of Coumadin.   09/01/16: Inject Lovenox in the fatty tissue every 12 hours and take 1 and  tablets of Coumadin.   09/02/16: Inject Lovenox in the fatty tissue every 12 hours and take 1 and  tablets of Coumadin.  09/03/16: Coumadin appt to check INR.   Call if there are any issues or if you have any questions: [phone].

## 2016-08-27 DIAGNOSIS — S91311D Laceration without foreign body, right foot, subsequent encounter: Secondary | ICD-10-CM | POA: Diagnosis not present

## 2016-08-27 DIAGNOSIS — Z86718 Personal history of other venous thrombosis and embolism: Secondary | ICD-10-CM | POA: Diagnosis not present

## 2016-08-27 DIAGNOSIS — S91301D Unspecified open wound, right foot, subsequent encounter: Secondary | ICD-10-CM | POA: Diagnosis not present

## 2016-08-27 DIAGNOSIS — I69354 Hemiplegia and hemiparesis following cerebral infarction affecting left non-dominant side: Secondary | ICD-10-CM | POA: Diagnosis not present

## 2016-09-03 ENCOUNTER — Ambulatory Visit (INDEPENDENT_AMBULATORY_CARE_PROVIDER_SITE_OTHER): Payer: 59 | Admitting: *Deleted

## 2016-09-03 DIAGNOSIS — Z952 Presence of prosthetic heart valve: Secondary | ICD-10-CM | POA: Diagnosis not present

## 2016-09-03 DIAGNOSIS — I6930 Unspecified sequelae of cerebral infarction: Secondary | ICD-10-CM

## 2016-09-03 DIAGNOSIS — I6350 Cerebral infarction due to unspecified occlusion or stenosis of unspecified cerebral artery: Secondary | ICD-10-CM | POA: Diagnosis not present

## 2016-09-03 DIAGNOSIS — I359 Nonrheumatic aortic valve disorder, unspecified: Secondary | ICD-10-CM | POA: Diagnosis not present

## 2016-09-03 DIAGNOSIS — Z7901 Long term (current) use of anticoagulants: Secondary | ICD-10-CM

## 2016-09-03 LAB — POCT INR: INR: 2.9

## 2016-09-04 ENCOUNTER — Other Ambulatory Visit: Payer: Self-pay | Admitting: Cardiology

## 2016-09-05 ENCOUNTER — Encounter: Payer: Self-pay | Admitting: Cardiology

## 2016-09-05 ENCOUNTER — Ambulatory Visit (INDEPENDENT_AMBULATORY_CARE_PROVIDER_SITE_OTHER): Payer: 59 | Admitting: Cardiology

## 2016-09-05 ENCOUNTER — Encounter (INDEPENDENT_AMBULATORY_CARE_PROVIDER_SITE_OTHER): Payer: Self-pay

## 2016-09-05 ENCOUNTER — Ambulatory Visit: Payer: 59 | Admitting: Cardiology

## 2016-09-05 VITALS — BP 132/74 | HR 69 | Ht 71.0 in | Wt 197.0 lb

## 2016-09-05 DIAGNOSIS — I10 Essential (primary) hypertension: Secondary | ICD-10-CM

## 2016-09-05 DIAGNOSIS — Z7901 Long term (current) use of anticoagulants: Secondary | ICD-10-CM | POA: Diagnosis not present

## 2016-09-05 DIAGNOSIS — Z952 Presence of prosthetic heart valve: Secondary | ICD-10-CM

## 2016-09-05 DIAGNOSIS — I359 Nonrheumatic aortic valve disorder, unspecified: Secondary | ICD-10-CM | POA: Diagnosis not present

## 2016-09-05 DIAGNOSIS — E782 Mixed hyperlipidemia: Secondary | ICD-10-CM

## 2016-09-05 MED ORDER — ALPRAZOLAM 0.5 MG PO TABS: 0.5000 mg | ORAL_TABLET | ORAL | 0 refills | Status: DC | PRN

## 2016-09-05 NOTE — Patient Instructions (Signed)
Medication Instructions:   DR [name] HAS PRESCRIBED YOU XANAX 0.5 MG TAKE ONE BY MOUTH EVERY 12 HOURS AS NEEDED FOR ANXIETY     Testing/Procedures:  Your physician has requested that you have an echocardiogram. Echocardiography is a painless test that uses sound waves to create images of your heart. It provides your doctor with information about the size and shape of your heart and how well your heart's chambers and valves are working. This procedure takes approximately one hour. There are no restrictions for this procedure.  PLEASE SCHEDULE ECHO ON SAME DAY HE COMES IN FOR COUMADIN CHECK     Follow-Up:  Your physician wants you to follow-up in: [place] will receive a reminder letter in the mail two months in advance. If you don't receive a letter, please call our office to schedule the follow-up appointment.        If you need a refill on your cardiac medications before your next appointment, please call your pharmacy.

## 2016-09-05 NOTE — Progress Notes (Signed)
Patient ID: Robert [name], male   DOB: 10/21/1960, 56 y.o.   MRN: [id number]    Patient Name: Robert [name] Date of Encounter: 09/05/2016  Primary Care Provider:  [name], MD Primary Cardiologist:  Ena Dawley [facility] Dr. Verl Blalock)  Chief complaint: 6 months follow-up  Problem List   Past Medical History:  Diagnosis Date  . Aneurysm of ascending aorta (HCC)   . Blood transfusion without reported diagnosis   . Cataract    BEGINNING  . CVA (cerebral infarction)   . DVT (deep venous thrombosis) ([place])   . Hx of adenomatous colonic polyps 05/11/2014  . Hyperlipidemia   . Migraine headache   . Renal stone   . S/P aortic valve replacement   . Stroke ([place])   . Subacute bacterial endocarditis (SBE)    Past Surgical History:  Procedure Laterality Date  . AORTIC VALVE REPLACEMENT  2007  . CARDIAC CATHETERIZATION  01/2005  . I&D EXTREMITY Right 06/23/2016   Procedure: IRRIGATION AND DEBRIDEMENT FOOT;  Surgeon: Netta Cedars, MD;  Location: [facility];  Service: Orthopedics;  Laterality: Right;  . TENDON REPAIR Right 06/23/2016   Procedure: TENDON REPAIR;  Surgeon: Netta Cedars, MD;  Location: [facility];  Service: Orthopedics;  Laterality: Right;    Allergies  Allergies  Allergen Reactions  . Tape Rash and Other (See Comments)    Other reaction(s): Other (See Comments) Makes skin raw Inflammation and TEARS OFF THE SKIN!!    HPI  56 year old gentleman with prior medical history of bicuspid aortic valve and dilated ascending aorta that underwent aortic valve and root replacement in 2007. 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  The patient is coming after one year, he continues to be very active even though lately he slowed down. He denies any palpitations or syncope. He is very compliant with his  Coumadin and denies any bleeding. He denies any orthopnea, paroxysmal nocturnal dyspnea. One episode of chest pain while he was arguing with his wife otherwise no exertional chest pain or dyspnea on exertion. No claudications or lower extremity edema.   09/05/2016, the patient is coming after 6 months, he has been doing well from cardiac standpoint, however he has had accident with a chain saw hurting his her right foot, ever since then he has underwent 3 surgeries including tendon repair. He has been in excruciating pain leading to anxiety and nights. He is otherwise in great spirits and tries to exercise in order to not to lose muscle strengths. He denies any chest pain, shortness of breath no lower extremity edema no orthopnea no palpitations dizziness or syncope. He has been compliant with his meds and the only side effect is bruising from Lovenox. His INR has been always above 2.  Home Medications  Prior to Admission medications   Medication Sig Start Date End Date Taking? Authorizing Provider  gabapentin (NEURONTIN) 800 MG tablet Take 1 tablet (800 mg total) by mouth 2 (two) times daily. 04/29/13  Yes Dennie Bible, NP  warfarin (COUMADIN) 5 MG tablet 6.25 mg alternate with 5 mg 02/09/13  Yes Dorothy Spark, MD   Family History  Family History  Problem Relation Age of Onset  . CVA Mother 16       hemorrhagic cva  . Colon cancer Paternal Grandfather   . Colon cancer Paternal Grandmother   .  Diabetes Neg Hx   . Kidney disease Neg Hx   . Esophageal cancer Neg Hx   . Gallbladder disease Neg Hx   . Heart disease Neg Hx     Social History  Social History   Social History  . Marital status: Married    Spouse name: [name]  . Number of children: 8  . Years of education: B[employer]   Occupational History  . Oncologist   Social History Main Topics  . Smoking status: Never Smoker  . Smokeless tobacco: Never Used  . Alcohol use 1.2 oz/week     2 Standard drinks or equivalent per week     Comment: Drinks 1 beer every 4 days  . Drug use: No  . Sexual activity: Not on file   Other Topics Concern  . Not on file   Social History Narrative   Patient lives at home with his wife [name].    Patient has 8 children. Boys   Patient is an Art gallery manager.          Review of Systems, as per HPI, otherwise negative General:  No chills, fever, night sweats or weight changes.  Cardiovascular:  No chest pain, dyspnea on exertion, edema, orthopnea, palpitations, paroxysmal nocturnal dyspnea. Dermatological: No rash, lesions/masses Respiratory: No cough, dyspnea Urologic: No hematuria, dysuria Abdominal:   No nausea, vomiting, diarrhea, bright red blood per rectum, melena, or hematemesis Neurologic:  No visual changes, wkns, changes in mental status. All other systems reviewed and are otherwise negative except as noted above.  Physical Exam  Blood pressure 132/74, pulse 69, height 5\' 11"  (1.803 m), weight 197 lb (89.4 kg).  General: Pleasant, NAD Psych: Normal affect. Neuro: Alert and oriented X 3. Moves all extremities spontaneously. HEENT: Normal  Neck: Supple without bruits or JVD. Lungs:  Resp regular and unlabored, CTA. Heart: RRR no s3, s4, or murmurs. Loud clicks of mechanical valve. Abdomen: Soft, non-tender, non-distended, BS + x 4.  Extremities: No clubbing, cyanosis or edema. DP/PT/Radials 2+ and equal bilaterally.  Labs:  No results for input(s): CKTOTAL, CKMB, TROPONINI in the last 72 hours. Lab Results  Component Value Date   WBC 5.7 06/23/2016   HGB 10.5 (L) 06/23/2016   HCT 31.0 (L) 06/23/2016   MCV 88.1 06/23/2016   PLT 244 06/23/2016    Lab Results  Component Value Date   DDIMER (H) 06/07/2008    0.76        AT THE INHOUSE ESTABLISHED CUTOFF VALUE OF 0.48 ug/mL FEU, THIS ASSAY HAS BEEN DOCUMENTED IN THE LITERATURE TO HAVE A SENSITIVITY AND NEGATIVE PREDICTIVE VALUE OF AT LEAST 98 TO 99%.   THE TEST RESULT SHOULD BE CORRELATED WITH AN ASSESSMENT OF THE CLINICAL PROBABILITY OF DVT / VTE.   Invalid input(s): POCBNP    Component Value Date/Time   NA 138 06/23/2016 2048   K 4.1 06/23/2016 2048   CL 109 06/23/2016 2048   CO2 22 06/23/2016 2048   GLUCOSE 123 (H) 06/23/2016 2048   BUN 17 06/23/2016 2048   CREATININE 0.88 06/23/2016 2048   CREATININE 0.75 02/23/2015 0001   CALCIUM 7.7 (L) 06/23/2016 2048   PROT 6.6 02/23/2015 0001   ALBUMIN 4.1 02/23/2015 0001   AST 29 02/23/2015 0001   ALT 32 02/23/2015 0001   ALKPHOS 60 02/23/2015 0001   BILITOT 1.4 (H) 02/23/2015 0001   GFRNONAA >60 06/23/2016 2048   GFRAA >60 06/23/2016 2048   Lab Results  Component Value Date   CHOL 154 02/23/2015   HDL 55 02/23/2015   LDLCALC 83 02/23/2015   TRIG 82 02/23/2015   Accessory Clinical Findings  Echocardiogram 2015  - Left ventricle: The cavity size was normal. There was mild focal basal hypertrophy of the septum. Systolic function was normal. The estimated ejection fraction was in the range of 60% to 65%. Wall motion was normal; there were no regional wall motion abnormalities. Features are consistent with a pseudonormal left ventricular filling pattern, with concomitant abnormal relaxation and increased filling pressure (grade 2 diastolic dysfunction).  - Aortic valve: A bioprosthesis was present. Trivial regurgitation. - Aortic root: The aortic root was mildly dilated. Impressions:  - Compared to 06/08/08, LV function remains normal; bioprosthetic aortic valve not well visualized but gradients are normal; if clinically indicated, TEE would have greater sensitivity for SOE.  ECG - performed today 09/05/2016 shows normal sinus rhythm, first-degree AV block is resolved, normal EKG.    Assessment & Plan   1. History of bicuspid aortic valve and aortic root dilatation status post AVR and aortic root replacement in 2007 and AVR with  mechanical bicuspid valve in 2010 for infection complicated by embolic events. Patient has chronic Coumadin and he is very compliant. Her last echocardiogram in 2015 shows well functioning AV prosthesis with normal transaortic gradients. No recent change in symptoms, no echo indicated. Continue warfarin, his hemoglobin is stable, INR therapeutic. Lovenox for bridging. He has no significant murmur however he hasn't had any echocardiogram in the last 3 years we'll repeat to reevaluate his transaortic gradients.  2. Blood pressure - controlled.  3. Lipids - all at goal in 12/2014 - continue atorvastatin 10 mg po daily.  4. Foot surgery with pain anxiety, will prescribe limited Xanax 0.5 mg by mouth when necessary 10 pills total no refills. He's advised not to take it long-term. He is also advised that he has any signs of sepsis she should contact us for antibiotics to prevent endocarditis.  Follow-up in 6 months.  Ena Dawley, MD, [facility] 09/05/2016, 9:53 AM

## 2016-09-07 ENCOUNTER — Emergency Department (HOSPITAL_BASED_OUTPATIENT_CLINIC_OR_DEPARTMENT_OTHER): Payer: 59

## 2016-09-07 ENCOUNTER — Observation Stay (HOSPITAL_COMMUNITY): Payer: 59

## 2016-09-07 ENCOUNTER — Ambulatory Visit (HOSPITAL_COMMUNITY): Payer: 59

## 2016-09-07 ENCOUNTER — Other Ambulatory Visit: Payer: Self-pay | Admitting: Orthopedic Surgery

## 2016-09-07 ENCOUNTER — Encounter (HOSPITAL_BASED_OUTPATIENT_CLINIC_OR_DEPARTMENT_OTHER): Payer: Self-pay | Admitting: *Deleted

## 2016-09-07 ENCOUNTER — Observation Stay (HOSPITAL_BASED_OUTPATIENT_CLINIC_OR_DEPARTMENT_OTHER)
Admission: EM | Admit: 2016-09-07 | Discharge: 2016-09-09 | Disposition: A | Discharge status: Home / Self Care | Payer: 59 | Attending: Internal Medicine | Admitting: Internal Medicine

## 2016-09-07 ENCOUNTER — Other Ambulatory Visit (HOSPITAL_COMMUNITY): Payer: Self-pay | Admitting: Orthopedic Surgery

## 2016-09-07 DIAGNOSIS — Z79899 Other long term (current) drug therapy: Secondary | ICD-10-CM | POA: Insufficient documentation

## 2016-09-07 DIAGNOSIS — M7981 Nontraumatic hematoma of soft tissue: Secondary | ICD-10-CM | POA: Diagnosis not present

## 2016-09-07 DIAGNOSIS — F419 Anxiety disorder, unspecified: Secondary | ICD-10-CM | POA: Diagnosis not present

## 2016-09-07 DIAGNOSIS — R1030 Lower abdominal pain, unspecified: Secondary | ICD-10-CM | POA: Diagnosis not present

## 2016-09-07 DIAGNOSIS — Z87442 Personal history of urinary calculi: Secondary | ICD-10-CM | POA: Diagnosis not present

## 2016-09-07 DIAGNOSIS — D6832 Hemorrhagic disorder due to extrinsic circulating anticoagulants: Principal | ICD-10-CM | POA: Insufficient documentation

## 2016-09-07 DIAGNOSIS — T148XXA Other injury of unspecified body region, initial encounter: Secondary | ICD-10-CM | POA: Diagnosis not present

## 2016-09-07 DIAGNOSIS — I712 Thoracic aortic aneurysm, without rupture: Secondary | ICD-10-CM | POA: Diagnosis not present

## 2016-09-07 DIAGNOSIS — E785 Hyperlipidemia, unspecified: Secondary | ICD-10-CM | POA: Diagnosis present

## 2016-09-07 DIAGNOSIS — Z7901 Long term (current) use of anticoagulants: Secondary | ICD-10-CM | POA: Diagnosis not present

## 2016-09-07 DIAGNOSIS — I350 Nonrheumatic aortic (valve) stenosis: Secondary | ICD-10-CM | POA: Diagnosis not present

## 2016-09-07 DIAGNOSIS — Z9889 Other specified postprocedural states: Secondary | ICD-10-CM | POA: Diagnosis not present

## 2016-09-07 DIAGNOSIS — W293XXA Contact with powered garden and outdoor hand tools and machinery, initial encounter: Secondary | ICD-10-CM | POA: Insufficient documentation

## 2016-09-07 DIAGNOSIS — T45515A Adverse effect of anticoagulants, initial encounter: Secondary | ICD-10-CM | POA: Diagnosis not present

## 2016-09-07 DIAGNOSIS — Z91048 Other nonmedicinal substance allergy status: Secondary | ICD-10-CM | POA: Insufficient documentation

## 2016-09-07 DIAGNOSIS — S96129D Laceration of muscle and tendon of long extensor muscle of toe at ankle and foot level, unspecified foot, subsequent encounter: Secondary | ICD-10-CM | POA: Diagnosis present

## 2016-09-07 DIAGNOSIS — M7989 Other specified soft tissue disorders: Principal | ICD-10-CM

## 2016-09-07 DIAGNOSIS — Z952 Presence of prosthetic heart valve: Secondary | ICD-10-CM | POA: Diagnosis not present

## 2016-09-07 DIAGNOSIS — Z86718 Personal history of other venous thrombosis and embolism: Secondary | ICD-10-CM | POA: Insufficient documentation

## 2016-09-07 DIAGNOSIS — Z8673 Personal history of transient ischemic attack (TIA), and cerebral infarction without residual deficits: Secondary | ICD-10-CM

## 2016-09-07 DIAGNOSIS — Z800 Family history of malignant neoplasm of digestive organs: Secondary | ICD-10-CM | POA: Insufficient documentation

## 2016-09-07 DIAGNOSIS — Z888 Allergy status to other drugs, medicaments and biological substances status: Secondary | ICD-10-CM | POA: Insufficient documentation

## 2016-09-07 DIAGNOSIS — S91311A Laceration without foreign body, right foot, initial encounter: Secondary | ICD-10-CM | POA: Insufficient documentation

## 2016-09-07 DIAGNOSIS — M79661 Pain in right lower leg: Secondary | ICD-10-CM

## 2016-09-07 DIAGNOSIS — Z8679 Personal history of other diseases of the circulatory system: Secondary | ICD-10-CM | POA: Diagnosis not present

## 2016-09-07 DIAGNOSIS — M79604 Pain in right leg: Secondary | ICD-10-CM

## 2016-09-07 DIAGNOSIS — Z823 Family history of stroke: Secondary | ICD-10-CM | POA: Insufficient documentation

## 2016-09-07 DIAGNOSIS — R609 Edema, unspecified: Secondary | ICD-10-CM

## 2016-09-07 LAB — CBC
HCT: 37.3 % — ABNORMAL LOW (ref 39.0–52.0)
HEMOGLOBIN: 12.3 g/dL — AB (ref 13.0–17.0)
MCH: 27 pg (ref 26.0–34.0)
MCHC: 33 g/dL (ref 30.0–36.0)
MCV: 81.8 fL (ref 78.0–100.0)
PLATELETS: 334 10*3/uL (ref 150–400)
RBC: 4.56 MIL/uL (ref 4.22–5.81)
RDW: 13.5 % (ref 11.5–15.5)
WBC: 7.9 10*3/uL (ref 4.0–10.5)

## 2016-09-07 LAB — COMPREHENSIVE METABOLIC PANEL
ALT: 45 U/L (ref 17–63)
ANION GAP: 6 (ref 5–15)
AST: 50 U/L — ABNORMAL HIGH (ref 15–41)
Albumin: 4.1 g/dL (ref 3.5–5.0)
Alkaline Phosphatase: 64 U/L (ref 38–126)
BUN: 15 mg/dL (ref 6–20)
CHLORIDE: 101 mmol/L (ref 101–111)
CO2: 28 mmol/L (ref 22–32)
CREATININE: 0.85 mg/dL (ref 0.61–1.24)
Calcium: 8.8 mg/dL — ABNORMAL LOW (ref 8.9–10.3)
Glucose, Bld: 107 mg/dL — ABNORMAL HIGH (ref 65–99)
POTASSIUM: 3.8 mmol/L (ref 3.5–5.1)
Sodium: 135 mmol/L (ref 135–145)
Total Bilirubin: 1.5 mg/dL — ABNORMAL HIGH (ref 0.3–1.2)
Total Protein: 7.1 g/dL (ref 6.5–8.1)

## 2016-09-07 LAB — CBC WITH DIFFERENTIAL/PLATELET
Basophils Absolute: 0 10*3/uL (ref 0.0–0.1)
Basophils Relative: 0 %
EOS ABS: 0 10*3/uL (ref 0.0–0.7)
Eosinophils Relative: 0 %
HCT: 40.2 % (ref 39.0–52.0)
HEMOGLOBIN: 13.3 g/dL (ref 13.0–17.0)
LYMPHS PCT: 21 %
Lymphs Abs: 1.7 10*3/uL (ref 0.7–4.0)
MCH: 27.7 pg (ref 26.0–34.0)
MCHC: 33.1 g/dL (ref 30.0–36.0)
MCV: 83.6 fL (ref 78.0–100.0)
Monocytes Absolute: 0.8 10*3/uL (ref 0.1–1.0)
Monocytes Relative: 9 %
NEUTROS PCT: 70 %
Neutro Abs: 5.8 10*3/uL (ref 1.7–7.7)
Platelets: 315 10*3/uL (ref 150–400)
RBC: 4.81 MIL/uL (ref 4.22–5.81)
RDW: 13.8 % (ref 11.5–15.5)
WBC: 8.3 10*3/uL (ref 4.0–10.5)

## 2016-09-07 LAB — PROTIME-INR
INR: 3.94
PROTHROMBIN TIME: 39.5 s — AB (ref 11.4–15.2)

## 2016-09-07 LAB — ABO/RH: ABO/RH(D): A POS

## 2016-09-07 LAB — TYPE AND SCREEN
ABO/RH(D): A POS
ANTIBODY SCREEN: NEGATIVE

## 2016-09-07 MED ORDER — HYDROMORPHONE HCL 1 MG/ML IJ SOLN
0.5000 mg | Freq: Once | INTRAMUSCULAR | Status: AC
  Administered 2016-09-07: 0.5 mg via INTRAVENOUS
  Filled 2016-09-07: qty 1

## 2016-09-07 MED ORDER — IOPAMIDOL (ISOVUE-300) INJECTION 61%
100.0000 mL | Freq: Once | INTRAVENOUS | Status: AC | PRN
  Administered 2016-09-07: 100 mL via INTRAVENOUS

## 2016-09-07 MED ORDER — ONDANSETRON HCL 4 MG/2ML IJ SOLN: 4.0000 mg | Freq: Four times a day (QID) | INTRAMUSCULAR | Status: DC | PRN

## 2016-09-07 MED ORDER — SENNA 8.6 MG PO TABS
1.0000 | ORAL_TABLET | Freq: Two times a day (BID) | ORAL | Status: DC
  Administered 2016-09-07 – 2016-09-09 (×4): 8.6 mg via ORAL
  Filled 2016-09-07 (×4): qty 1

## 2016-09-07 MED ORDER — POLYETHYLENE GLYCOL 3350 17 G PO PACK: 17.0000 g | PACK | Freq: Every day | ORAL | Status: DC | PRN

## 2016-09-07 MED ORDER — CYCLOBENZAPRINE HCL 5 MG PO TABS
5.0000 mg | ORAL_TABLET | Freq: Every day | ORAL | Status: DC
  Administered 2016-09-08: 5 mg via ORAL
  Filled 2016-09-07: qty 1

## 2016-09-07 MED ORDER — ATORVASTATIN CALCIUM 10 MG PO TABS
10.0000 mg | ORAL_TABLET | Freq: Every day | ORAL | Status: DC
  Administered 2016-09-08: 10 mg via ORAL
  Filled 2016-09-07: qty 1

## 2016-09-07 MED ORDER — ACETAMINOPHEN 325 MG PO TABS
650.0000 mg | ORAL_TABLET | Freq: Four times a day (QID) | ORAL | Status: DC | PRN
  Administered 2016-09-08: 650 mg via ORAL
  Filled 2016-09-07: qty 2

## 2016-09-07 MED ORDER — OXYCODONE HCL 5 MG PO TABS
5.0000 mg | ORAL_TABLET | ORAL | Status: DC | PRN
  Administered 2016-09-08 (×6): 5 mg via ORAL
  Filled 2016-09-07 (×6): qty 1

## 2016-09-07 MED ORDER — ALPRAZOLAM 0.5 MG PO TABS: 0.5000 mg | ORAL_TABLET | Freq: Two times a day (BID) | ORAL | Status: DC | PRN

## 2016-09-07 MED ORDER — ONDANSETRON HCL 4 MG PO TABS: 4.0000 mg | ORAL_TABLET | Freq: Four times a day (QID) | ORAL | Status: DC | PRN

## 2016-09-07 MED ORDER — ACETAMINOPHEN 650 MG RE SUPP: 650.0000 mg | Freq: Four times a day (QID) | RECTAL | Status: DC | PRN

## 2016-09-07 MED ORDER — GABAPENTIN 800 MG PO TABS
800.0000 mg | ORAL_TABLET | Freq: Two times a day (BID) | ORAL | Status: DC
  Filled 2016-09-07: qty 1

## 2016-09-07 MED ORDER — GABAPENTIN 400 MG PO CAPS
800.0000 mg | ORAL_CAPSULE | Freq: Two times a day (BID) | ORAL | Status: DC
  Administered 2016-09-07 – 2016-09-09 (×4): 800 mg via ORAL
  Filled 2016-09-07 (×4): qty 2

## 2016-09-07 NOTE — H&P (Addendum)
Robert [name] EPP:[id number] DOB: Sep 01, 1960 DOA: 09/07/2016     PCP: [name], MD   Outpatient Specialists: Ena Dawley    Patient coming from:  home Lives  With family -  Chief Complaint: leg pain   HPI: Robert [name] is a 56 y.o. male with medical history significant of status post aortic valve replacement in 2007 and bacterial endocarditis resulting AVR 2010 as per patient with mechanical valve, hx of DVT, CVA,HLD, subacute bacterial endocarditis, ascending aorta aneurysm    Presented with right upper leg pain and swelling for the past 3 days patient had had recent right leg surgery for chainsaw injury in March 2018 to the right lower extremity. Requiring skin graft. He's been laying down in antigravity chair most of the day but reports he gets off for 2 hours a day and becomes very active he gets on a scooter bike and moves around aggressively. Few days ago after this aggressive activity he developed groin pain since then Moving  right hip is very  uncomfortable. He was seen for this by orthopedics yesterday who did plain films which were negative. Sent to [facility] for father evaluation ultrasound was obtained negative for DVT . CT worrisome for intramuscular hematoma.    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 requiring replacement with bileaflet aortic valve patient states it was mechanical.  On chronic Coumadin followed by cardiology  Last echogram 2015  mild focal basal hypertrophy of the septum, EF 60-65%  IN ER:  Temp (24hrs), Avg:98.6 F (37 C), Min:98.4 F (36.9 C), Max:98.8 F (37.1 C)     on arrival  ED Triage Vitals  Enc Vitals Group   BP 09/07/16 1033 137/89     Pulse Rate 09/07/16 1033 83     Resp 09/07/16 1033 16     Temp 09/07/16 1033 98.8 F (37.1 C)     Temp Source 09/07/16 1033 Oral     SpO2 09/07/16 1033 100 %     Weight 09/07/16 1035 197 lb (89.4 kg)     Height 09/07/16 1035 5\' 11"  (1.803 m)     Head Circumference --      Peak Flow --      Pain Score 09/07/16 1041 8     Pain Loc --      Pain Edu? --      Excl. in [place]? --   Currently   RR 18 satting 100% HR 89 BP 125/89 Sodium 135 potassium 3.8 BUN 15 creatinine 0.85 WBC 8.3 hemoglobin 13.3 platelets 315 INR 3.94  CT Elaine moderate right sided adductor muscle strain is septal intramuscular hemorrhage and edema Following Medications were ordered in ER: Medications  iopamidol (ISOVUE-300) 61 % injection 100 mL (100 mLs Intravenous Contrast Given 09/07/16 1254)  HYDROmorphone (DILAUDID) injection 0.5 mg (0.5 mg Intravenous Given 09/07/16 1438)     ER provider discussed case with: Dr. Marla Roe. surgery who recommended admission to medicine  Hospitalist was called for admission for Intramuscular  hematoma in the setting of hypercoagulation secondary to Coumadin  Review of Systems:    Pertinent positives include: right leg pain  Constitutional:  No weight loss, night sweats, Fevers, chills, fatigue, weight loss  HEENT:  No  headaches, Difficulty swallowing,Tooth/dental problems,Sore throat,  No sneezing, itching, ear ache, nasal congestion, post nasal drip,  Cardio-vascular:  No chest pain, Orthopnea, PND, anasarca, dizziness, palpitations.no Bilateral lower extremity swelling  GI:  No heartburn, indigestion, abdominal pain, nausea, vomiting, diarrhea, change in bowel habits, loss of appetite, melena, blood in stool, hematemesis Resp:  no shortness of breath at rest. No dyspnea on exertion, No excess mucus, no productive cough, No non-productive cough, No coughing up of blood.No change in color of mucus.No wheezing. Skin:  no rash or lesions. No  jaundice GU:  no dysuria, change in color of urine, no urgency or frequency. No straining to urinate.  No flank pain.  Musculoskeletal:  No joint pain or no joint swelling. No decreased range of motion. No back pain.  Psych:  No change in mood or affect. No depression or anxiety. No memory loss.  Neuro: no localizing neurological complaints, no tingling, no weakness, no double vision, no gait abnormality, no slurred speech, no confusion  As per HPI otherwise 10 point review of systems negative.   Past Medical History: Past Medical History:  Diagnosis Date  . Aneurysm of ascending aorta (HCC)   . Blood transfusion without reported diagnosis   . Cataract    BEGINNING  . CVA (cerebral infarction)   . DVT (deep venous thrombosis) ([place])   . Hx of adenomatous colonic polyps 05/11/2014  . Hyperlipidemia   . Migraine headache   . Renal stone   . S/P aortic valve replacement   . Stroke ([place])   . Subacute bacterial endocarditis (SBE)    Past Surgical History:  Procedure Laterality Date  . ABDOMINAL AORTIC ANEURYSM REPAIR    . AORTIC VALVE REPLACEMENT  2007  . CARDIAC CATHETERIZATION  01/2005  . I&D EXTREMITY Right 06/23/2016   Procedure: IRRIGATION AND DEBRIDEMENT FOOT;  Surgeon: Netta Cedars, MD;  Location: [facility];  Service: Orthopedics;  Laterality: Right;  . TENDON REPAIR Right 06/23/2016   Procedure: TENDON REPAIR;  Surgeon: Netta Cedars, MD;  Location: [facility];  Service: Orthopedics;  Laterality: Right;     Social History:  Ambulatory  Independently     reports that he has never smoked. He has never used smokeless tobacco. He reports that he drinks about 1.2 oz of alcohol per week . He reports that he does not use drugs.  Allergies:   Allergies  Allergen Reactions  . Tape Rash and Other (See Comments)    Other reaction(s): Other (See Comments) Makes skin raw Inflammation and TEARS OFF THE SKIN!!  . Tapentadol Other (See Comments) and Rash    Other reaction(s): Other (See  Comments) Makes skin raw Inflammation and TEARS OFF THE SKIN!! Inflammation and TEARS OFF THE SKIN!!       Family History:   Family History  Problem Relation Age of Onset  . CVA Mother 56       hemorrhagic cva  . Colon cancer Paternal Grandfather   . Colon cancer Paternal Grandmother   . Diabetes Neg Hx   . Kidney disease Neg Hx   . Esophageal cancer Neg Hx   . Gallbladder disease Neg Hx   . Heart disease Neg Hx     Medications: Prior to Admission medications   Medication Sig Start Date End Date Taking? Authorizing Provider  acetaminophen (TYLENOL) 500 MG tablet Take 1,000 mg by mouth 4 (four) times daily as needed for pain.    Yes [provider]  ALPRAZolam Duanne Moron) 0.5 MG tablet Take 1 tablet (  0.5 mg total) by mouth as needed for anxiety (every 12 hours). 09/05/16  Yes Dorothy Spark, MD  atorvastatin (LIPITOR) 10 MG tablet TAKE 1 TABLET (10 MG TOTAL) BY MOUTH DAILY. 08/15/15  Yes Dorothy Spark, MD  cyclobenzaprine (FLEXERIL) 5 MG tablet Take 5 mg by mouth daily. 09/06/16  Yes [provider]  gabapentin (NEURONTIN) 800 MG tablet TAKE 1 TABLET TWICE A DAY Patient taking differently: take 400mg  in the morning and 800mg  before bedtime 02/27/16  Yes [name], MD  oxyCODONE (OXY IR/ROXICODONE) 5 MG immediate release tablet Take 1-2 tablets (5-10 mg total) by mouth every 3 (three) hours as needed for breakthrough pain. 06/24/16  Yes Swinteck, Aaron Edelman, MD  warfarin (COUMADIN) 5 MG tablet Take as directed by coumadin clinic Patient taking differently: Take 5-7.5 mg by mouth See admin instructions. Take 7.5mg  by mouth once daily every day except for Fridays. Take 5mg  on Fridays 08/07/16  Yes Dorothy Spark, MD    Physical Exam: Patient Vitals for the past 24 hrs:  BP Temp Temp src Pulse Resp SpO2 Height Weight  09/07/16 1811 121/88 98.4 F (36.9 C) Oral 86 18 97 % - -  09/07/16 1530 125/89 - - 89 18 100 % - -  09/07/16 1500 139/89 - - 89 16 100 % - -    09/07/16 1430 130/88 - - 82 16 97 % - -  09/07/16 1400 128/80 - - 80 14 98 % - -  09/07/16 1330 139/76 - - 72 13 100 % - -  09/07/16 1239 - - - 85 18 96 % - -  09/07/16 1230 (!) 140/91 - - 80 19 97 % - -  09/07/16 1200 138/85 - - 81 (!) 23 98 % - -  09/07/16 1146 (!) 133/92 - - 79 11 98 % - -  09/07/16 1049 133/87 - - 85 15 98 % - -  09/07/16 1035 - - - - - - 5\' 11"  (1.803 m) 89.4 kg (197 lb)  09/07/16 1033 137/89 98.8 F (37.1 C) Oral 83 16 100 % - -    1. General:  in No Acute distress 2. Psychological: Alert and   Oriented 3. Head/ENT:     Dry Mucous Membranes                          Head Non traumatic, neck supple                          Normal   Dentition 4. SKIN:   decreased Skin turgor,  Skin clean Dry and intact no rash, slight bruising over right thigh, right lower ext sp repair 5. Heart: Regular rate and rhythm  Systolic Murmur, Rub or gallop 6. Lungs: Clear to auscultation bilaterally, no wheezes or crackles   7. Abdomen: Soft,  non-tender, Non distended 8. Lower extremities: no clubbing, cyanosis, or edema 9. Neurologically Grossly intact, moving all 4 extremities equally   No parasthesia noted 10. MSK: Normal range of motion except right hip   body mass index is 27.48 kg/m.  Labs on Admission:   Labs on Admission: I have personally reviewed following labs and imaging studies  CBC:  Recent Labs Lab 09/07/16 1044  WBC 8.3  NEUTROABS 5.8  HGB 13.3  HCT 40.2  MCV 83.6  PLT 315   Basic Metabolic Panel:  Recent Labs Lab 09/07/16 1044  NA 135  K 3.8  CL 101  CO2 28  GLUCOSE 107*  BUN 15  CREATININE 0.85  CALCIUM 8.8*   GFR: Estimated Creatinine Clearance: 104.6 mL/min (by C-G formula based on SCr of 0.85 mg/dL). Liver Function Tests:  Recent Labs Lab 09/07/16 1044  AST 50*  ALT 45  ALKPHOS 64  BILITOT 1.5*  PROT 7.1  ALBUMIN 4.1   No results for input(s): LIPASE, AMYLASE in the last 168 hours. No results for input(s): AMMONIA in  the last 168 hours. Coagulation Profile:  Recent Labs Lab 09/03/16 1351 09/07/16 1044  INR 2.9 3.94   Cardiac Enzymes: No results for input(s): CKTOTAL, CKMB, CKMBINDEX, TROPONINI in the last 168 hours. BNP (last 3 results) No results for input(s): PROBNP in the last 8760 hours. HbA1C: No results for input(s): HGBA1C in the last 72 hours. CBG: No results for input(s): GLUCAP in the last 168 hours. Lipid Profile: No results for input(s): CHOL, HDL, LDLCALC, TRIG, CHOLHDL, LDLDIRECT in the last 72 hours. Thyroid Function Tests: No results for input(s): TSH, T4TOTAL, FREET4, T3FREE, THYROIDAB in the last 72 hours. Anemia Panel: No results for input(s): VITAMINB12, FOLATE, FERRITIN, TIBC, IRON, RETICCTPCT in the last 72 hours. Urine analysis: No results found for: COLORURINE, APPEARANCEUR, LABSPEC, PHURINE, GLUCOSEU, HGBUR, BILIRUBINUR, KETONESUR, PROTEINUR, UROBILINOGEN, NITRITE, LEUKOCYTESUR Sepsis Labs: @LABRCNTIP (procalcitonin:4,lacticidven:4) )No results found for this or any previous visit (from the past 240 hour(s)).    UA not ordered  No results found for: HGBA1C  Estimated Creatinine Clearance: 104.6 mL/min (by C-G formula based on SCr of 0.85 mg/dL).  BNP (last 3 results) No results for input(s): PROBNP in the last 8760 hours.   ECG REPORT Not ordered   Filed Weights   09/07/16 1035  Weight: 89.4 kg (197 lb)     Cultures:    Component Value Date/Time   SDES BLOOD RIGHT ARM 06/16/2008 2330   SPECREQUEST BOTTLES DRAWN AEROBIC AND ANAEROBIC 10CC 06/16/2008 2330   CULT NO GROWTH 5 DAYS 06/16/2008 2330   REPTSTATUS 06/23/2008 FINAL 06/16/2008 2330     Radiological Exams on Admission: Ct Pelvis W Contrast  Result Date: 09/07/2016 CLINICAL DATA:  Right groin pain, erythema superior to the mid thighs since Tuesday. Evaluate for hematoma. EXAM: CT PELVIS WITH CONTRAST TECHNIQUE: Multidetector CT imaging of the pelvis was performed using the standard protocol  following the bolus administration of intravenous contrast. CONTRAST:  175mL ISOVUE-300 IOPAMIDOL (ISOVUE-300) INJECTION 61% COMPARISON:  None. FINDINGS: Urinary Tract:  No abnormality visualized. Bowel:  Unremarkable visualized pelvic bowel loops. Vascular/Lymphatic: No pathologically enlarged lymph nodes. No significant vascular abnormality seen. Reproductive:  No mass or other significant abnormality Other:  None. Musculoskeletal: Edematous appearance of the adductor muscles of the right thigh with subtle area of intra muscular hyperdensity consistent with intramuscular strain and slight hemorrhage, series 5, image 117. The bony pelvis, included lumbar spine, hip joints and proximal femora appear intact without acute nor suspicious osseous abnormalities. IMPRESSION: 1. Findings consistent with moderate right-sided adductor muscle strain with subtle intramuscular hemorrhage and edema noted within. 2. No acute nor suspicious osseous abnormality. Electronically Signed   By: Ashley Royalty M.D.   On: 09/07/2016 13:44   US Venous Img Lower Unilateral Right  Result Date: 09/07/2016 CLINICAL DATA:  56 year old male with right medial thigh pain redness and swelling for 4 days. Prior right lower extremity surgeries due to chainsaw accident, most recent surgery was earlier this month. EXAM: RIGHT LOWER EXTREMITY VENOUS DOPPLER ULTRASOUND TECHNIQUE: Gray-scale sonography with graded compression, as  well as color Doppler and duplex ultrasound were performed to evaluate the lower extremity deep venous systems from the level of the common femoral vein and including the common femoral, femoral, profunda femoral, popliteal and calf veins including the posterior tibial, peroneal and gastrocnemius veins when visible. The superficial great saphenous vein was also interrogated. Spectral Doppler was utilized to evaluate flow at rest and with distal augmentation maneuvers in the common femoral, femoral and popliteal veins.  COMPARISON:  Right foot radiographs 06/23/2016 FINDINGS: Contralateral Common Femoral Vein: Respiratory phasicity is normal and symmetric with the symptomatic side. No evidence of thrombus. Normal compressibility. Common Femoral Vein: No evidence of thrombus. Normal compressibility, respiratory phasicity and response to augmentation. Saphenofemoral Junction: No evidence of thrombus. Normal compressibility and flow on color Doppler imaging. Profunda Femoral Vein: No evidence of thrombus. Normal compressibility and flow on color Doppler imaging. Femoral Vein: No evidence of thrombus. Normal compressibility, respiratory phasicity and response to augmentation. Popliteal Vein: No evidence of thrombus. Normal compressibility, respiratory phasicity and response to augmentation. Calf Veins: No evidence of thrombus. Normal compressibility and flow on color Doppler imaging. Superficial Great Saphenous Vein: No evidence of thrombus. Normal compressibility and flow on color Doppler imaging. Venous Reflux:  None. Other Findings:  None. IMPRESSION: No evidence of DVT within the right lower extremity. Electronically Signed   By: Genevie Ann M.D.   On: 09/07/2016 11:47    Chart has been reviewed    Assessment/Plan  56 y.o. male with medical history significant of status post aortic valve replacement in 2007 and bacterial endocarditis resulting AVR 2010 as per patient with mechanical valve, hx of DVT, CVA,HLD, subacute bacterial endocarditis, ascending aorta aneurysm  Admitted for Intramuscular  hematoma in the setting of hypercoagulation secondary to Coumadin   Present on Admission: . Hematoma - given complex cardiac history discussed with cardiology for how long Coumadin should be held. Given elevated INR on hold for now. As per Cardiology while having bleeding keep INR goal 2.0-3.0 Patient status post mechanical AVR repair in 2010 aortic valve. Monitor for development of worsening hematoma or vascular compromise obtain  serial CBC . Laceration of muscle and tendon of long extensor muscle of toe at ankle and foot level, unspecified foot, subsequent encounter - continue to monitor currently no evidence of infection or DVT . Hyperlipidemia LDL goal <100 - continue home medications History of mechanical AVR - cardiology have reviewed records and confirmed this is mechanical aortic valve, aortic click is noted. Given ongoing bleeding for now maintaining INR with goal between 2.0 - 3.0  Other plan as per orders.  DVT prophylaxis:  Currently hypercoagulable   Code Status:  FULL CODE  as per patient   Family Communication:   Family not  at  Bedside     Disposition Plan:     To home once workup is complete and patient is stable                             Consults called: discussed with cardiology,    Admission status:  obs   Level of care     medical floor        I have spent a total of 67 min on this admission   extra time was spent to discuss case with consultant   Tyona Nilsen 09/07/2016, 10:01 PM    [facility]  Pager [phone]   after 2 AM please page floor coverage PA If 7AM-7PM, please  contact the day team taking care of the patient  [email or web address]  Password [email or web address]

## 2016-09-07 NOTE — ED Notes (Signed)
Pt has severe pain to the R thigh, denies paresthesia, paresis, or pallor. 2+ DP pulse present

## 2016-09-07 NOTE — ED Notes (Signed)
ED Provider at bedside. 

## 2016-09-07 NOTE — ED Notes (Signed)
Patient transported to Ultrasound 

## 2016-09-07 NOTE — ED Provider Notes (Signed)
[facility] Provider Note   CSN: [id number] Arrival date & time: 09/07/16  1023     History   Chief Complaint Chief Complaint  Patient presents with  . Leg Pain    HPI Robert [name] is a 56 y.o. male.  HPI   Patient is 56 year old male history of DVT status post aortic valve replacement and bacterial endocarditis with recent chainsaw injury to the right lower extremity presenting with pain in the right groin. This is been ongoing since Tuesday. Is worse with movement. Patient was riding in the car very uncomfortable. He finds extension and the position of the hip uncomfortable. Is worse with movement. Patient saw his orthopedist from his chainsaw injury yesterday they did plain films which were negative. They recommended ultrasound and think this likely groin sprain.  Past Medical History:  Diagnosis Date  . Aneurysm of ascending aorta (HCC)   . Blood transfusion without reported diagnosis   . Cataract    BEGINNING  . CVA (cerebral infarction)   . DVT (deep venous thrombosis) ([place])   . Hx of adenomatous colonic polyps 05/11/2014  . Hyperlipidemia   . Migraine headache   . Renal stone   . S/P aortic valve replacement   . Stroke ([place])   . Subacute bacterial endocarditis (SBE)     Patient Active Problem List   Diagnosis Date Noted  . Laceration of muscle and tendon of long extensor muscle of toe at ankle and foot level, unspecified foot, subsequent encounter 07/06/2016  . Open wound of right foot 07/05/2016  . Laceration of extensor tendon of right foot 06/23/2016  . Cataract 02/23/2015  . Hx of adenomatous colonic polyps 05/11/2014  . Hyperlipidemia LDL goal <100 02/15/2014  . Migraine without aura and without status migrainosus, not intractable 02/15/2014  . Unspecified late effects of cerebrovascular disease 04/29/2013  . Chronic anticoagulation 06/27/2010  . H/O: CVA (cerebrovascular accident) 07/30/2008  . S/P AVR (aortic valve replacement) 04/17/2005      Past Surgical History:  Procedure Laterality Date  . ABDOMINAL AORTIC ANEURYSM REPAIR    . AORTIC VALVE REPLACEMENT  2007  . CARDIAC CATHETERIZATION  01/2005  . I&D EXTREMITY Right 06/23/2016   Procedure: IRRIGATION AND DEBRIDEMENT FOOT;  Surgeon: Netta Cedars, MD;  Location: [facility];  Service: Orthopedics;  Laterality: Right;  . TENDON REPAIR Right 06/23/2016   Procedure: TENDON REPAIR;  Surgeon: Netta Cedars, MD;  Location: [facility] [place];  Service: Orthopedics;  Laterality: Right;       Home Medications    Prior to Admission medications   Medication Sig Start Date End Date Taking? Authorizing Provider  acetaminophen (TYLENOL) 500 MG tablet Take 500 mg by mouth 4 (four) times daily as needed for pain.   Yes [provider]  ALPRAZolam (XANAX) 0.5 MG tablet Take 1 tablet (0.5 mg total) by mouth as needed for anxiety (every 12 hours). 09/05/16  Yes Dorothy Spark, MD  atorvastatin (LIPITOR) 10 MG tablet TAKE 1 TABLET (10 MG TOTAL) BY MOUTH DAILY. 08/15/15  Yes Dorothy Spark, MD  Cyclobenzaprine HCl (FLEXERIL PO) Take by mouth.   Yes [provider]  gabapentin (NEURONTIN) 800 MG tablet TAKE 1 TABLET TWICE A DAY Patient taking differently: Take 800 mg by mouth one to two times a day 02/27/16  Yes [name], MD  oxyCODONE (OXY IR/ROXICODONE) 5 MG immediate release tablet Take 1-2 tablets (5-10 mg total) by mouth every 3 (three) hours as needed for breakthrough pain. 06/24/16  Yes Swinteck, Aaron Edelman, MD  warfarin (COUMADIN) 5 MG tablet Take as directed by coumadin clinic 08/07/16  Yes Dorothy Spark, MD  amoxicillin (AMOXIL) 500 MG capsule Take 2,000 mg by mouth See admin instructions. ONE HOUR PRIOR TO DENTAL APPOINTMENTS AS A "PRE-TREATMENT"    [provider]  docusate sodium (COLACE) 100 MG capsule Take 1 capsule (100 mg total) by mouth 2 (two) times daily. 06/24/16   Rod Can, MD    Family History Family History  Problem Relation Age of Onset  .  CVA Mother 50       hemorrhagic cva  . Colon cancer Paternal Grandfather   . Colon cancer Paternal Grandmother   . Diabetes Neg Hx   . Kidney disease Neg Hx   . Esophageal cancer Neg Hx   . Gallbladder disease Neg Hx   . Heart disease Neg Hx     Social History Social History  Substance Use Topics  . Smoking status: Never Smoker  . Smokeless tobacco: Never Used  . Alcohol use 1.2 oz/week    2 Standard drinks or equivalent per week     Comment: Drinks 1 beer every 4 days     Allergies   Tape   Review of Systems Review of Systems  Constitutional: Negative for activity change.  Respiratory: Negative for shortness of breath.   Cardiovascular: Negative for chest pain.  Gastrointestinal: Negative for abdominal pain.  Skin: Negative for color change, pallor and rash.     Physical Exam Updated Vital Signs BP 133/87   Pulse 85   Temp 98.8 F (37.1 C) (Oral)   Resp 15   Ht 5\' 11"  (1.803 m)   Wt 89.4 kg (197 lb)   SpO2 98%   BMI 27.48 kg/m   Physical Exam  Constitutional: He is oriented to person, place, and time. He appears well-nourished.  HENT:  Head: Normocephalic.  Eyes: Conjunctivae are normal.  Cardiovascular: Normal rate and regular rhythm.   Pulmonary/Chest: Effort normal and breath sounds normal.  Musculoskeletal:  Well-healing wounds to the right lower foot. Pain with palpation of the right groin. Pain with rotation of hip and the right groin.  Neurological: He is oriented to person, place, and time.  Skin: Skin is warm and dry. He is not diaphoretic.  Psychiatric: He has a normal mood and affect. His behavior is normal.     ED Treatments / Results  Labs (all labs ordered are listed, but only abnormal results are displayed) Labs Reviewed - No data to display  EKG  EKG Interpretation None       Radiology No results found.  Procedures Procedures (including critical care time)  Medications Ordered in ED Medications - No data to  display   Initial Impression / Assessment and Plan / ED Course  I have reviewed the triage vital signs and the nursing notes.  Pertinent labs & imaging results that were available during my care of the patient were reviewed by me and considered in my medical decision making (see chart for details).    All  56 year old male presenting with pain to the right groin with manipulation and movement. I suspect that this is groin strain. Patient seen by orthopedist 2 days ago had plain films. Patient here for DVT study.  2:03 PM DVT study was normal. CT ordered. Patient had bruising that is developing in the right groin. CT shows evidence of edema surrounding the adductor muscle with  bleeding in the area. Patient's INR is 4.  He is on Coumadin for aortic valve mechanical, therefore wtihout active extrav, or increase in hematoma, I would not reverse the anticaog.  Discussed with Dr. Hassell Done who recommmends admission to medicine.    Discussed with Dr. Pricilla Handler, will admit to [facility].   Final Clinical Impressions(s) / ED Diagnoses   Final diagnoses:  None    New Prescriptions New Prescriptions   No medications on file     Macarthur Critchley, MD 09/07/16 1526

## 2016-09-07 NOTE — ED Notes (Signed)
Patient transported to CT 

## 2016-09-07 NOTE — Progress Notes (Signed)
ANTICOAGULATION CONSULT NOTE - Initial Consult  Pharmacy Consult for warfarin Indication: mechanical aortic valve  Allergies  Allergen Reactions  . Tape Rash and Other (See Comments)    Other reaction(s): Other (See Comments) Makes skin raw Inflammation and TEARS OFF THE SKIN!!  . Tapentadol Other (See Comments) and Rash    Other reaction(s): Other (See Comments) Makes skin raw Inflammation and TEARS OFF THE SKIN!! Inflammation and TEARS OFF THE SKIN!!    Patient Measurements: Height: 5\' 11"  (180.3 cm) Weight: 197 lb (89.4 kg) IBW/kg (Calculated) : 75.3   Vital Signs: Temp: 98.4 F (36.9 C) (06/15 1811) Temp Source: Oral (06/15 1811) BP: 121/88 (06/15 1811) Pulse Rate: 86 (06/15 1811)  Labs:  Recent Labs  09/07/16 1044  HGB 13.3  HCT 40.2  PLT 315  LABPROT 39.5*  INR 3.94  CREATININE 0.85    Estimated Creatinine Clearance: 104.6 mL/min (by C-G formula based on SCr of 0.85 mg/dL).   Medical History: Past Medical History:  Diagnosis Date  . Aneurysm of ascending aorta (HCC)   . Blood transfusion without reported diagnosis   . Cataract    BEGINNING  . CVA (cerebral infarction)   . DVT (deep venous thrombosis) ([place])   . Hx of adenomatous colonic polyps 05/11/2014  . Hyperlipidemia   . Migraine headache   . Renal stone   . S/P aortic valve replacement   . Stroke ([place])   . Subacute bacterial endocarditis (SBE)     Assessment: 56 year old male history of DVT status post aortic valve replacement and bacterial endocarditis with recent chainsaw injury to the right lower extremity presenting with pain in the right groin. This is been ongoing since Tuesday. Pharmacy consulted to dose warfarin for mechanical valve.  Home dose 7.5mg  daily except 5mg  on fridays Last dose 6/14 at 2200 INR 3.94 H/H WNL Plts WNL  Goal of Therapy:  INR 2.5-3.5   Plan:  Hold warfarin tonight Daily INR  Dolly Rias RPh 09/07/2016, 8:16 PM Pager [phone]

## 2016-09-07 NOTE — ED Triage Notes (Signed)
Pt presents today with R upper leg pain and swelling since this past Tuesday night. Was seen by MD yesterday and instructed to have [place]. Reports color change in R leg. Cap refill <2secs; toes warm and red. Pt has had recent R leg surgery for chainsaw injury. Pt has hx of DVT. Denies chest pain, sob. Ace wrap in place on R lower leg.

## 2016-09-08 DIAGNOSIS — Z7901 Long term (current) use of anticoagulants: Secondary | ICD-10-CM | POA: Diagnosis not present

## 2016-09-08 DIAGNOSIS — Z952 Presence of prosthetic heart valve: Secondary | ICD-10-CM | POA: Diagnosis not present

## 2016-09-08 DIAGNOSIS — T148XXA Other injury of unspecified body region, initial encounter: Secondary | ICD-10-CM

## 2016-09-08 DIAGNOSIS — S96129D Laceration of muscle and tendon of long extensor muscle of toe at ankle and foot level, unspecified foot, subsequent encounter: Secondary | ICD-10-CM

## 2016-09-08 DIAGNOSIS — Z8673 Personal history of transient ischemic attack (TIA), and cerebral infarction without residual deficits: Secondary | ICD-10-CM

## 2016-09-08 DIAGNOSIS — E785 Hyperlipidemia, unspecified: Secondary | ICD-10-CM

## 2016-09-08 LAB — CBC
HCT: 36.3 % — ABNORMAL LOW (ref 39.0–52.0)
HEMATOCRIT: 37.2 % — AB (ref 39.0–52.0)
HEMATOCRIT: 36.4 % — AB (ref 39.0–52.0)
Hemoglobin: 11.9 g/dL — ABNORMAL LOW (ref 13.0–17.0)
Hemoglobin: 11.9 g/dL — ABNORMAL LOW (ref 13.0–17.0)
Hemoglobin: 11.8 g/dL — ABNORMAL LOW (ref 13.0–17.0)
MCH: 26.7 pg (ref 26.0–34.0)
MCH: 27.2 pg (ref 26.0–34.0)
MCH: 27.1 pg (ref 26.0–34.0)
MCHC: 32 g/dL (ref 30.0–36.0)
MCHC: 32.7 g/dL (ref 30.0–36.0)
MCHC: 32.5 g/dL (ref 30.0–36.0)
MCV: 83.6 fL (ref 78.0–100.0)
MCV: 83.3 fL (ref 78.0–100.0)
MCV: 83.3 fL (ref 78.0–100.0)
PLATELETS: 328 10*3/uL (ref 150–400)
PLATELETS: 305 10*3/uL (ref 150–400)
PLATELETS: 350 10*3/uL (ref 150–400)
RBC: 4.45 MIL/uL (ref 4.22–5.81)
RBC: 4.37 MIL/uL (ref 4.22–5.81)
RBC: 4.36 MIL/uL (ref 4.22–5.81)
RDW: 13.7 % (ref 11.5–15.5)
RDW: 13.5 % (ref 11.5–15.5)
RDW: 13.6 % (ref 11.5–15.5)
WBC: 8.2 10*3/uL (ref 4.0–10.5)
WBC: 6.5 10*3/uL (ref 4.0–10.5)
WBC: 6.9 10*3/uL (ref 4.0–10.5)

## 2016-09-08 LAB — HIV ANTIBODY (ROUTINE TESTING W REFLEX): HIV Screen 4th Generation wRfx: NONREACTIVE

## 2016-09-08 LAB — PROTIME-INR
INR: 3.71
Prothrombin Time: 37.7 seconds — ABNORMAL HIGH (ref 11.4–15.2)

## 2016-09-08 LAB — PHOSPHORUS: PHOSPHORUS: 4.3 mg/dL (ref 2.5–4.6)

## 2016-09-08 LAB — COMPREHENSIVE METABOLIC PANEL
ALBUMIN: 3.9 g/dL (ref 3.5–5.0)
ALT: 39 U/L (ref 17–63)
AST: 44 U/L — AB (ref 15–41)
Alkaline Phosphatase: 60 U/L (ref 38–126)
Anion gap: 9 (ref 5–15)
BUN: 17 mg/dL (ref 6–20)
CO2: 30 mmol/L (ref 22–32)
CREATININE: 0.98 mg/dL (ref 0.61–1.24)
Calcium: 9.2 mg/dL (ref 8.9–10.3)
Chloride: 100 mmol/L — ABNORMAL LOW (ref 101–111)
GFR calc Af Amer: >60 mL/min (ref >60)
GFR calc non Af Amer: >60 mL/min (ref >60)
GLUCOSE: 111 mg/dL — AB (ref 65–99)
POTASSIUM: 4.2 mmol/L (ref 3.5–5.1)
Sodium: 139 mmol/L (ref 135–145)
Total Bilirubin: 1.2 mg/dL (ref 0.3–1.2)
Total Protein: 6.4 g/dL — ABNORMAL LOW (ref 6.5–8.1)

## 2016-09-08 LAB — TSH: TSH: 2.158 u[IU]/mL (ref 0.350–4.500)

## 2016-09-08 LAB — MAGNESIUM: Magnesium: 1.9 mg/dL (ref 1.7–2.4)

## 2016-09-08 MED ORDER — WARFARIN - PHARMACIST DOSING INPATIENT: Freq: Every day | Status: DC

## 2016-09-08 NOTE — Progress Notes (Signed)
ANTICOAGULATION CONSULT NOTE - Initial Consult  Pharmacy Consult for warfarin Indication: mechanical aortic valve  Allergies  Allergen Reactions  . Tape Rash and Other (See Comments)    Other reaction(s): Other (See Comments) Makes skin raw Inflammation and TEARS OFF THE SKIN!!  . Tapentadol Other (See Comments) and Rash    Other reaction(s): Other (See Comments) Makes skin raw Inflammation and TEARS OFF THE SKIN!! Inflammation and TEARS OFF THE SKIN!!    Patient Measurements: Height: 5\' 11"  (180.3 cm) Weight: 197 lb (89.4 kg) IBW/kg (Calculated) : 75.3   Vital Signs: Temp: 98 F (36.7 C) (06/16 0415) Temp Source: Oral (06/16 0415) BP: 129/69 (06/16 0415) Pulse Rate: 79 (06/16 0415)  Labs:  Recent Labs  09/07/16 1044 09/07/16 2059 09/08/16 0400  HGB 13.3 12.3* 11.9*  HCT 40.2 37.3* 37.2*  PLT 315 334 328  LABPROT 39.5*  --  37.7*  INR 3.94  --  3.71  CREATININE 0.85  --  0.98    Estimated Creatinine Clearance: 90.7 mL/min (by C-G formula based on SCr of 0.98 mg/dL).   Medical History: Past Medical History:  Diagnosis Date  . Aneurysm of ascending aorta (HCC)   . Blood transfusion without reported diagnosis   . Cataract    BEGINNING  . CVA (cerebral infarction)   . DVT (deep venous thrombosis) ([place])   . Hx of adenomatous colonic polyps 05/11/2014  . Hyperlipidemia   . Migraine headache   . Renal stone   . S/P aortic valve replacement   . Stroke ([place])   . Subacute bacterial endocarditis (SBE)     Assessment: 56 year old male history of DVT status post aortic valve replacement and bacterial endocarditis with recent chainsaw injury to the right lower extremity presenting with pain in the right groin. This is been ongoing since Tuesday. Pharmacy consulted to dose warfarin for mechanical valve.  Home dose 7.5mg  daily except 5mg  on fridays Last dose 6/14 at 2200  Update: per H&P after consulting with cardiology, due to intramuscular hematoma,  cardiology recommends new INR goal on 2-3  Today, 09/08/16  INR 3.71, supratherapeutic  Hgb 11.9  Plts 328  Goal of Therapy:  INR 2-3   Plan:   Hold warfarin tonight  Daily INR   Royetta Asal, PharmD, BCPS Pager [phone] 09/08/2016 8:16 AM

## 2016-09-08 NOTE — Progress Notes (Addendum)
PROGRESS NOTE    Robert [name]  POE:[id number] DOB: 1960/12/05 DOA: 09/07/2016 PCP: [name], MD   Chief Complaint  Patient presents with  . Leg Pain    Brief Narrative:  HPI on 09/07/2016 by Dr. Leonie Man Robert [name] is a 56 y.o. male with medical history significant of status post aortic valve replacement in 2007 and bacterial endocarditis resulting AVR 2010 as per patient with mechanical valve, hx of DVT, CVA,HLD, subacute bacterial endocarditis, ascending aorta aneurysm. Presented with right upper leg pain and swelling for the past 3 days patient had had recent right leg surgery for chainsaw injury in March 2018 to the right lower extremity. Requiring skin graft. He's been laying down in antigravity chair most of the day but reports he gets off for 2 hours a day and becomes very active he gets on a scooter bike and moves around aggressively. Few days ago after this aggressive activity he developed groin pain since then Moving  right hip is very  uncomfortable. He was seen for this by orthopedics yesterday who did plain films which were negative. Sent to [facility] for father evaluation ultrasound was obtained negative for DVT . CT worrisome for intramuscular hematoma.   Assessment & Plan   Hematoma/Right sided adductor muscle strain with intramuscular hemorrhage -Noted on CT pelvis with contrast -Likely secondary to hypercoagulopathy from Coumadin -Discussed with orthopedics, Dr. Marcelino Scot. Recommended conservative management. -Continue neuro checks -Continue to monitor hemoglobin and hematocrit  History of endocarditis/mechanical AVR -Continue Coumadin, INR goal now between 2-3, given above findings -Will obtain echocardiogram -Wife concerned about petechial area on patient's right foot given his history of endocarditis, will obtain blood cultures. Patient currently afebrile, no leukocytosis  Recent right foot laceration -From chainsaw accident -Patient follows  with [facility] -Continue wound care  History of stroke -Continue Coumadin  History of DVT -Continue Coumadin -Lower extremity Doppler showed no DVT  Hyperlipidemia -Continue statin  DVT Prophylaxis  Coumadin  Code Status: Full  Family Communication: Wife at bedside  Disposition Plan: Observation  Consultants Orthopedics, Dr. Marcelino Scot  Procedures  Lower extremity doppler  Antibiotics   Anti-infectives    None      Subjective:   Robert [name] seen and examined today.  Patient feeling better today. Feels muscles are less tight. Denies Chest pain, shortness breath, abdominal pain, nausea or vomiting, diarrhea or constipation. Left ear leafy green foods.  Objective:   Vitals:   09/07/16 1530 09/07/16 1811 09/07/16 2208 09/08/16 0415  BP: 125/89 121/88 140/79 129/69  Pulse: 89 86 84 79  Resp: 18 18 18 18   Temp:  98.4 F (36.9 C) 99 F (37.2 C) 98 F (36.7 C)  TempSrc:  Oral Oral Oral  SpO2: 100% 97% 97% 97%  Weight:      Height:        Intake/Output Summary (Last 24 hours) at 09/08/16 1356 Last data filed at 09/08/16 1047  Gross per 24 hour  Intake              120 ml  Output                0 ml  Net              120 ml   Filed Weights   09/07/16 1035  Weight: 89.4 kg (197 lb)   Exam  General: Well developed, well nourished, NAD, appears stated age  58: NCAT,mucous membranes moist.   Cardiovascular: S1  S2 auscultated, RRR, mechanical click  Respiratory: Clear to auscultation bilaterally with equal chest rise  Abdomen: Soft, nontender, nondistended, + bowel sounds  Extremities: warm dry without cyanosis clubbing or edema, ecchymosis on right thigh  Neuro: AAOx3, nonfocal  Skin: Without rashes exudates or nodules, right foot dressing, dressing on right shin, bruising and ecchymosis on right inner thigh  Psych: Normal affect and demeanor with intact judgement and insight   Data Reviewed: I have personally reviewed following labs and  imaging studies  CBC:  Recent Labs Lab 09/07/16 1044 09/07/16 2059 09/08/16 0400 09/08/16 1154  WBC 8.3 7.9 8.2 6.5  NEUTROABS 5.8  --   --   --   HGB 13.3 12.3* 11.9* 11.9*  HCT 40.2 37.3* 37.2* 36.4*  MCV 83.6 81.8 83.6 83.3  PLT 315 334 328 185   Basic Metabolic Panel:  Recent Labs Lab 09/07/16 1044 09/08/16 0400  NA 135 139  K 3.8 4.2  CL 101 100*  CO2 28 30  GLUCOSE 107* 111*  BUN 15 17  CREATININE 0.85 0.98  CALCIUM 8.8* 9.2  MG  --  1.9  PHOS  --  4.3   GFR: Estimated Creatinine Clearance: 90.7 mL/min (by C-G formula based on SCr of 0.98 mg/dL). Liver Function Tests:  Recent Labs Lab 09/07/16 1044 09/08/16 0400  AST 50* 44*  ALT 45 39  ALKPHOS 64 60  BILITOT 1.5* 1.2  PROT 7.1 6.4*  ALBUMIN 4.1 3.9   No results for input(s): LIPASE, AMYLASE in the last 168 hours. No results for input(s): AMMONIA in the last 168 hours. Coagulation Profile:  Recent Labs Lab 09/03/16 1351 09/07/16 1044 09/08/16 0400  INR 2.9 3.94 3.71   Cardiac Enzymes: No results for input(s): CKTOTAL, CKMB, CKMBINDEX, TROPONINI in the last 168 hours. BNP (last 3 results) No results for input(s): PROBNP in the last 8760 hours. HbA1C: No results for input(s): HGBA1C in the last 72 hours. CBG: No results for input(s): GLUCAP in the last 168 hours. Lipid Profile: No results for input(s): CHOL, HDL, LDLCALC, TRIG, CHOLHDL, LDLDIRECT in the last 72 hours. Thyroid Function Tests:  Recent Labs  09/08/16 0400  TSH 2.158   Anemia Panel: No results for input(s): VITAMINB12, FOLATE, FERRITIN, TIBC, IRON, RETICCTPCT in the last 72 hours. Urine analysis: No results found for: COLORURINE, APPEARANCEUR, LABSPEC, PHURINE, GLUCOSEU, HGBUR, BILIRUBINUR, KETONESUR, PROTEINUR, UROBILINOGEN, NITRITE, LEUKOCYTESUR Sepsis Labs: @LABRCNTIP (procalcitonin:4,lacticidven:4)  )No results found for this or any previous visit (from the past 240 hour(s)).    Radiology Studies: Ct Pelvis W  Contrast  Result Date: 09/07/2016 CLINICAL DATA:  Right groin pain, erythema superior to the mid thighs since Tuesday. Evaluate for hematoma. EXAM: CT PELVIS WITH CONTRAST TECHNIQUE: Multidetector CT imaging of the pelvis was performed using the standard protocol following the bolus administration of intravenous contrast. CONTRAST:  122mL ISOVUE-300 IOPAMIDOL (ISOVUE-300) INJECTION 61% COMPARISON:  None. FINDINGS: Urinary Tract:  No abnormality visualized. Bowel:  Unremarkable visualized pelvic bowel loops. Vascular/Lymphatic: No pathologically enlarged lymph nodes. No significant vascular abnormality seen. Reproductive:  No mass or other significant abnormality Other:  None. Musculoskeletal: Edematous appearance of the adductor muscles of the right thigh with subtle area of intra muscular hyperdensity consistent with intramuscular strain and slight hemorrhage, series 5, image 117. The bony pelvis, included lumbar spine, hip joints and proximal femora appear intact without acute nor suspicious osseous abnormalities. IMPRESSION: 1. Findings consistent with moderate right-sided adductor muscle strain with subtle intramuscular hemorrhage and edema noted within. 2. No acute  nor suspicious osseous abnormality. Electronically Signed   By: Ashley Royalty M.D.   On: 09/07/2016 13:44   US Venous Img Lower Unilateral Right  Result Date: 09/07/2016 CLINICAL DATA:  56 year old male with right medial thigh pain redness and swelling for 4 days. Prior right lower extremity surgeries due to chainsaw accident, most recent surgery was earlier this month. EXAM: RIGHT LOWER EXTREMITY VENOUS DOPPLER ULTRASOUND TECHNIQUE: Gray-scale sonography with graded compression, as well as color Doppler and duplex ultrasound were performed to evaluate the lower extremity deep venous systems from the level of the common femoral vein and including the common femoral, femoral, profunda femoral, popliteal and calf veins including the posterior  tibial, peroneal and gastrocnemius veins when visible. The superficial great saphenous vein was also interrogated. Spectral Doppler was utilized to evaluate flow at rest and with distal augmentation maneuvers in the common femoral, femoral and popliteal veins. COMPARISON:  Right foot radiographs 06/23/2016 FINDINGS: Contralateral Common Femoral Vein: Respiratory phasicity is normal and symmetric with the symptomatic side. No evidence of thrombus. Normal compressibility. Common Femoral Vein: No evidence of thrombus. Normal compressibility, respiratory phasicity and response to augmentation. Saphenofemoral Junction: No evidence of thrombus. Normal compressibility and flow on color Doppler imaging. Profunda Femoral Vein: No evidence of thrombus. Normal compressibility and flow on color Doppler imaging. Femoral Vein: No evidence of thrombus. Normal compressibility, respiratory phasicity and response to augmentation. Popliteal Vein: No evidence of thrombus. Normal compressibility, respiratory phasicity and response to augmentation. Calf Veins: No evidence of thrombus. Normal compressibility and flow on color Doppler imaging. Superficial Great Saphenous Vein: No evidence of thrombus. Normal compressibility and flow on color Doppler imaging. Venous Reflux:  None. Other Findings:  None. IMPRESSION: No evidence of DVT within the right lower extremity. Electronically Signed   By: Genevie Ann M.D.   On: 09/07/2016 11:47   Dg Chest Port 1 View  Result Date: 09/08/2016 CLINICAL DATA:  Right upper leg pain and swelling, internal bleeding EXAM: PORTABLE CHEST 1 VIEW COMPARISON:  06/21/2008 FINDINGS: Postsurgical changes of the mediastinum. No acute infiltrate or effusion. Stable cardiomediastinal silhouette. No pneumothorax. IMPRESSION: No active disease. Electronically Signed   By: Donavan Foil M.D.   On: 09/08/2016 00:02     Scheduled Meds: . atorvastatin  10 mg Oral q1800  . cyclobenzaprine  5 mg Oral Daily  .  gabapentin  800 mg Oral BID  . senna  1 tablet Oral BID  . Warfarin - Pharmacist Dosing Inpatient   Does not apply q1800   Continuous Infusions:   LOS: 0 days   Time Spent in minutes   30 minutes  Leanna Hamid D.O. on 09/08/2016 at 1:56 PM  Between 7am to 7pm - Pager - [phone]  After 7pm go to [email or web address] - password [email or web address]  And look for the night coverage person covering for me after hours  [facility] Office  [phone]

## 2016-09-08 NOTE — Evaluation (Signed)
Physical Therapy Evaluation Patient Details Name: Robert [name] MRN: [id number] DOB: 08/26/1960 Today's Date: 09/08/2016   History of Present Illness  Patient is a 56 yo male admitted 09/07/16 with right upper leg pain and swelling for the past 3 days. Patient had recent right leg surgery for chainsaw injury in March 2018.  CT - intramuscular hematoma.     PMH:  recent injury to Rt foot requiring surgeries, aortic valve replacement in 2007 and bacterial endocarditis resulting AVR 2010 as per patient with mechanical valve, hx of DVT, CVA,HLD, subacute bacterial endocarditis, ascending aorta aneurysm     Clinical Impression  Patient presents with problems listed below.  Will benefit from acute PT to maximize functional independence prior to discharge home with family.  Patient with 2 injuries to RLE, with pain, decreased strength and ROM.  Would recommend OP PT when appropriate for RLE.  Will continue to follow.    Follow Up Recommendations Supervision for mobility/OOB;Outpatient PT (When appropriate for RLE)    Equipment Recommendations  None recommended by PT    Recommendations for Other Services       Precautions / Restrictions Precautions Precautions: Fall Restrictions Weight Bearing Restrictions: No      Mobility  Bed Mobility Overal bed mobility: Modified Independent             General bed mobility comments: Increased time.  Transfers Overall transfer level: Needs assistance Equipment used: Crutches Transfers: Sit to/from Stand Sit to Stand: Min guard         General transfer comment: Assist for balance/safety.  Ambulation/Gait Ambulation/Gait assistance: Min guard Ambulation Distance (Feet): 40 Feet Assistive device: Crutches Gait Pattern/deviations: Step-to pattern;Decreased stride length (Hop-to.  Keeps RLE NWB.) Gait velocity: decreased Gait velocity interpretation: Below normal speed for age/gender General Gait Details: Patient with slightly unsteady  gait with crutches, with loss of balance x1.  Patient keeps LE's attached together with ace bandage to protect RLE.  Ambulates NWB on RLE.  Stairs            Wheelchair Mobility    Modified Rankin (Stroke Patients Only)       Balance Overall balance assessment: Needs assistance         Standing balance support: Single extremity supported Standing balance-Leahy Scale: Poor                               Pertinent Vitals/Pain Pain Assessment: 0-10 Pain Score: 7  Pain Location: RLE Pain Descriptors / Indicators: Constant;Sore;Aching Pain Intervention(s): Limited activity within patient's tolerance;Monitored during session;Repositioned    Home Living Family/patient expects to be discharged to:: Private residence Living Arrangements: Spouse/significant other;Children Available Help at Discharge: Family;Available 24 hours/day Type of Home: House Home Access: Stairs to enter Entrance Stairs-Rails: None Entrance Stairs-Number of Steps: 1 Home Layout: Two level;Able to live on main level with bedroom/bathroom Home Equipment: Crutches;Walker - 2 wheels;Cane - single point;Bedside commode;Shower seat (Kneeling walker)      Prior Function Level of Independence: Independent with assistive device(s)         Comments: Patient using kneeling RW or crutches for mobility.     Hand Dominance   Dominant Hand: Right    Extremity/Trunk Assessment   Upper Extremity Assessment Upper Extremity Assessment: Overall WFL for tasks assessed    Lower Extremity Assessment Lower Extremity Assessment: RLE deficits/detail RLE Deficits / Details: Decreased strength and ROM due to pain.  Patient with LE's linked together  with ace bandage, allowing LLE to assist RLE. RLE: Unable to fully assess due to pain RLE Sensation: decreased light touch (Great toe) RLE Coordination: decreased gross motor    Cervical / Trunk Assessment Cervical / Trunk Assessment: Normal   Communication   Communication: No difficulties  Cognition Arousal/Alertness: Awake/alert Behavior During Therapy: WFL for tasks assessed/performed Overall Cognitive Status: Within Functional Limits for tasks assessed                                        General Comments      Exercises     Assessment/Plan    PT Assessment Patient needs continued PT services  PT Problem List Decreased strength;Decreased range of motion;Decreased activity tolerance;Decreased balance;Decreased mobility;Pain       PT Treatment Interventions DME instruction;Gait training;Stair training;Functional mobility training;Therapeutic activities;Patient/family education    PT Goals (Current goals can be found in the Care Plan section)  Acute Rehab PT Goals Patient Stated Goal: To get back to normal routine PT Goal Formulation: With patient Time For Goal Achievement: 09/15/16 Potential to Achieve Goals: Good    Frequency Min 3X/week   Barriers to discharge        Co-evaluation               AM-PAC PT "6 Clicks" Daily Activity  Outcome Measure Difficulty turning over in bed (including adjusting bedclothes, sheets and blankets)?: None Difficulty moving from lying on back to sitting on the side of the bed? : None Difficulty sitting down on and standing up from a chair with arms (e.g., wheelchair, bedside commode, etc,.)?: Total Help needed moving to and from a bed to chair (including a wheelchair)?: A Little Help needed walking in hospital room?: A Little Help needed climbing 3-5 steps with a railing? : A Little 6 Click Score: 18    End of Session Equipment Utilized During Treatment: Gait belt Activity Tolerance: Patient limited by pain Patient left: in bed;with call bell/phone within reach   PT Visit Diagnosis: Unsteadiness on feet (R26.81);Muscle weakness (generalized) (M62.81);Pain Pain - Right/Left: Right Pain - part of body: Leg    Time: 1548-1610 PT Time  Calculation (min) (ACUTE ONLY): 22 min   Charges:   PT Evaluation $PT Eval Moderate Complexity: 1 Procedure     PT G Codes:   PT G-Codes **NOT FOR INPATIENT CLASS** Functional Assessment Tool Used: AM-PAC 6 Clicks Basic Mobility Functional Limitation: Mobility: Walking and moving around Mobility: Walking and Moving Around Current Status (Z1245): At least 20 percent but less than 40 percent impaired, limited or restricted Mobility: Walking and Moving Around Goal Status [phone]): At least 1 percent but less than 20 percent impaired, limited or restricted    Carita Pian. Sanjuana Kava, [facility] Acute Rehab Services Pager [place] 09/08/2016, 7:44 PM

## 2016-09-08 NOTE — Progress Notes (Signed)
OT Cancellation Note  Patient Details Name: Robert [name] MRN: [id number] DOB: [birth year]   Cancelled Treatment:    Reason Eval/Treat Not Completed: Other (comment) Per pt and wife, pt with a new bruising/spot on R toe and they would like to discuss this with MD before attempting to mobilize at this time.  Philippa Chester  [phone] 09/08/2016, 1:47 PM

## 2016-09-09 ENCOUNTER — Observation Stay (HOSPITAL_BASED_OUTPATIENT_CLINIC_OR_DEPARTMENT_OTHER): Payer: 59

## 2016-09-09 DIAGNOSIS — Z7901 Long term (current) use of anticoagulants: Secondary | ICD-10-CM | POA: Diagnosis not present

## 2016-09-09 DIAGNOSIS — Z952 Presence of prosthetic heart valve: Secondary | ICD-10-CM

## 2016-09-09 DIAGNOSIS — Z8673 Personal history of transient ischemic attack (TIA), and cerebral infarction without residual deficits: Secondary | ICD-10-CM | POA: Diagnosis not present

## 2016-09-09 DIAGNOSIS — T148XXA Other injury of unspecified body region, initial encounter: Secondary | ICD-10-CM | POA: Diagnosis not present

## 2016-09-09 LAB — BASIC METABOLIC PANEL
Anion gap: 7 (ref 5–15)
BUN: 20 mg/dL (ref 6–20)
CO2: 31 mmol/L (ref 22–32)
Calcium: 9.1 mg/dL (ref 8.9–10.3)
Chloride: 98 mmol/L — ABNORMAL LOW (ref 101–111)
Creatinine, Ser: 0.94 mg/dL (ref 0.61–1.24)
GFR calc non Af Amer: >60 mL/min (ref >60)
Glucose, Bld: 110 mg/dL — ABNORMAL HIGH (ref 65–99)
POTASSIUM: 4.1 mmol/L (ref 3.5–5.1)
SODIUM: 136 mmol/L (ref 135–145)

## 2016-09-09 LAB — CBC
HEMATOCRIT: 36.4 % — AB (ref 39.0–52.0)
HEMATOCRIT: 36.5 % — AB (ref 39.0–52.0)
HEMATOCRIT: 35.1 % — AB (ref 39.0–52.0)
Hemoglobin: 11.9 g/dL — ABNORMAL LOW (ref 13.0–17.0)
Hemoglobin: 12.1 g/dL — ABNORMAL LOW (ref 13.0–17.0)
Hemoglobin: 11.6 g/dL — ABNORMAL LOW (ref 13.0–17.0)
MCH: 27 pg (ref 26.0–34.0)
MCH: 27.6 pg (ref 26.0–34.0)
MCH: 27.4 pg (ref 26.0–34.0)
MCHC: 32.6 g/dL (ref 30.0–36.0)
MCHC: 33.2 g/dL (ref 30.0–36.0)
MCHC: 33 g/dL (ref 30.0–36.0)
MCV: 83 fL (ref 78.0–100.0)
MCV: 83.1 fL (ref 78.0–100.0)
MCV: 82.8 fL (ref 78.0–100.0)
PLATELETS: 382 10*3/uL (ref 150–400)
PLATELETS: 382 10*3/uL (ref 150–400)
PLATELETS: 359 10*3/uL (ref 150–400)
RBC: 4.38 MIL/uL (ref 4.22–5.81)
RBC: 4.4 MIL/uL (ref 4.22–5.81)
RBC: 4.24 MIL/uL (ref 4.22–5.81)
RDW: 13.6 % (ref 11.5–15.5)
RDW: 13.6 % (ref 11.5–15.5)
RDW: 13.5 % (ref 11.5–15.5)
WBC: 6.8 10*3/uL (ref 4.0–10.5)
WBC: 7.1 10*3/uL (ref 4.0–10.5)
WBC: 7.5 10*3/uL (ref 4.0–10.5)

## 2016-09-09 LAB — PROTIME-INR
INR: 1.99
Prothrombin Time: 22.9 seconds — ABNORMAL HIGH (ref 11.4–15.2)

## 2016-09-09 LAB — ECHOCARDIOGRAM COMPLETE
Height: 71 in
WEIGHTICAEL: 3152 [oz_av]

## 2016-09-09 MED ORDER — WARFARIN SODIUM 5 MG PO TABS: 5.0000 mg | ORAL_TABLET | Freq: Once | ORAL | Status: DC

## 2016-09-09 MED ORDER — CYCLOBENZAPRINE HCL 5 MG PO TABS
5.0000 mg | ORAL_TABLET | Freq: Three times a day (TID) | ORAL | Status: DC | PRN
  Administered 2016-09-09: 5 mg via ORAL
  Filled 2016-09-09: qty 1

## 2016-09-09 MED ORDER — CYCLOBENZAPRINE HCL 5 MG PO TABS: 5.0000 mg | ORAL_TABLET | Freq: Three times a day (TID) | ORAL | 0 refills | Status: DC | PRN

## 2016-09-09 MED ORDER — DICLOFENAC SODIUM 1 % TD GEL
2.0000 g | Freq: Three times a day (TID) | TRANSDERMAL | Status: DC | PRN
  Filled 2016-09-09: qty 100

## 2016-09-09 NOTE — Progress Notes (Signed)
  Echocardiogram 2D Echocardiogram has been performed.  Robert [name] 09/09/2016, 9:52 AM

## 2016-09-09 NOTE — Progress Notes (Signed)
   09/09/16 1300  PT Visit Information  Last PT Received On 09/09/16  Reason Eval/Treat Not Completed Other (comment) (pt politely declined, just got back from bathroom )

## 2016-09-09 NOTE — Progress Notes (Signed)
ANTICOAGULATION CONSULT NOTE - [place] for warfarin Indication: mechanical aortic valve, new INR target 2-3  Allergies  Allergen Reactions  . Tape Rash and Other (See Comments)    Other reaction(s): Other (See Comments) Makes skin raw Inflammation and TEARS OFF THE SKIN!!  . Tapentadol Other (See Comments) and Rash    Other reaction(s): Other (See Comments) Makes skin raw Inflammation and TEARS OFF THE SKIN!! Inflammation and TEARS OFF THE SKIN!!    Patient Measurements: Height: 5\' 11"  (180.3 cm) Weight: 197 lb (89.4 kg) IBW/kg (Calculated) : 75.3   Vital Signs: Temp: 98.3 F (36.8 C) (06/17 0502) Temp Source: Oral (06/17 0502) BP: 115/81 (06/17 0502) Pulse Rate: 85 (06/17 0502)  Labs:  Recent Labs  09/07/16 1044  09/08/16 0400 09/08/16 1154 09/08/16 2045 09/09/16 0453  HGB 13.3  < > 11.9* 11.9* 11.8* 12.1*  11.9*  HCT 40.2  < > 37.2* 36.4* 36.3* 36.4*  36.5*  PLT 315  < > 328 305 350 382  382  LABPROT 39.5*  --  37.7*  --   --  22.9*  INR 3.94  --  3.71  --   --  1.99  CREATININE 0.85  --  0.98  --   --  0.94  < > = values in this interval not displayed.  Estimated Creatinine Clearance: 94.6 mL/min (by C-G formula based on SCr of 0.94 mg/dL).   Medical History: Past Medical History:  Diagnosis Date  . Aneurysm of ascending aorta (HCC)   . Blood transfusion without reported diagnosis   . Cataract    BEGINNING  . CVA (cerebral infarction)   . DVT (deep venous thrombosis) ([place])   . Hx of adenomatous colonic polyps 05/11/2014  . Hyperlipidemia   . Migraine headache   . Renal stone   . S/P aortic valve replacement   . Stroke ([place])   . Subacute bacterial endocarditis (SBE)     Assessment: 56 year old male history of DVT status post aortic valve replacement and bacterial endocarditis with recent chainsaw injury to the right lower extremity presenting with pain in the right groin. This is been ongoing since Tuesday. Pharmacy  consulted to dose warfarin for mechanical valve.  Home dose 7.5mg  daily except 5mg  on fridays. This home regimen was for the target INR of 2.5-3.5. Last dose 6/14 at 2200  Update: per H&P after consulting with cardiology, due to intramuscular hematoma, cardiology recommends new INR goal on 2-3  Today, 09/09/16  INR 1.99, supratherapeutic  Hgb 12.1  Plts 382  No new DDI drugs started  Thin liquid diet ordered. No meals charted on flowsheet   Goal of Therapy:  INR 2-3   Plan:   Warfarin 5 mg PO  x1   Daily INR  Monitor for signs and symptoms of bleeding    Royetta Asal, PharmD, BCPS Pager [phone] 09/09/2016 7:29 AM

## 2016-09-09 NOTE — Discharge Instructions (Signed)
Hematoma A hematoma is a collection of blood under the skin, in an organ, in a body space, in a joint space, or in other tissue. The blood can clot to form a lump that you can see and feel. The lump is often firm and may sometimes become sore and tender. Most hematomas get better in a few days to weeks. However, some hematomas may be serious and require medical care. Hematomas can range in size from very small to very large. What are the causes? A hematoma can be caused by a blunt or penetrating injury. It can also be caused by spontaneous leakage from a blood vessel under the skin. Spontaneous leakage from a blood vessel is more likely to occur in older people, especially those taking blood thinners. Sometimes, a hematoma can develop after certain medical procedures. What are the signs or symptoms?  A firm lump on the body.  Possible pain and tenderness in the area.  Bruising.Blue, dark blue, purple-red, or yellowish skin may appear at the site of the hematoma if the hematoma is close to the surface of the skin. For hematomas in deeper tissues or body spaces, the signs and symptoms may be subtle. For example, an intra-abdominal hematoma may cause abdominal pain, weakness, fainting, and shortness of breath. An intracranial hematoma may cause a headache or symptoms such as weakness, trouble speaking, or a change in consciousness. How is this diagnosed? A hematoma can usually be diagnosed based on your medical history and a physical exam. Imaging tests may be needed if your health care provider suspects a hematoma in deeper tissues or body spaces, such as the abdomen, head, or chest. These tests may include ultrasonography or a CT scan. How is this treated? Hematomas usually go away on their own over time. Rarely does the blood need to be drained out of the body. Large hematomas or those that may affect vital organs will sometimes need surgical drainage or monitoring. Follow these instructions at  home:  Apply ice to the injured area:  Put ice in a plastic bag.  Place a towel between your skin and the bag.  Leave the ice on for 20 minutes, 2-3 times a day for the first 1 to 2 days.  After the first 2 days, switch to using warm compresses on the hematoma.  Elevate the injured area to help decrease pain and swelling. Wrapping the area with an elastic bandage may also be helpful. Compression helps to reduce swelling and promotes shrinking of the hematoma. Make sure the bandage is not wrapped too tight.  If your hematoma is on a lower extremity and is painful, crutches may be helpful for a couple days.  Only take over-the-counter or prescription medicines as directed by your health care provider. Get help right away if:  You have increasing pain, or your pain is not controlled with medicine.  You have a fever.  You have worsening swelling or discoloration.  Your skin over the hematoma breaks or starts bleeding.  Your hematoma is in your chest or abdomen and you have weakness, shortness of breath, or a change in consciousness.  Your hematoma is on your scalp (caused by a fall or injury) and you have a worsening headache or a change in alertness or consciousness. This information is not intended to replace advice given to you by your health care provider. Make sure you discuss any questions you have with your health care provider. Document Released: 10/25/2003 Document Revised: 08/18/2015 Document Reviewed: 08/20/2012 Elsevier Interactive Patient   Education  2017 [employer]vier [employer]  

## 2016-09-09 NOTE — Progress Notes (Signed)
Discharged from floor ambulatory for transport home by car. Wife & belongings with pt. No changes in assessment. Roston Grunewald, [employer]

## 2016-09-09 NOTE — Discharge Summary (Addendum)
Physician Discharge Summary  AUTHER LYERLY IWP:[id number] DOB: 25-Nov-1960 DOA: 09/07/2016  PCP: [name], MD  Admit date: 09/07/2016 Discharge date: 09/09/2016  Time spent: 45 minutes  Recommendations for Outpatient Follow-up:  Patient will be discharged to home.  Patient will need to follow up with primary care provider within one week of discharge. Repeat INR on 09/10/2016. Follow up with surgeon. Continue current wound care. Patient should continue medications as prescribed.  Patient should follow a regular diet.   Discharge Diagnoses:  Hematoma/Right sided adductor muscle strain with intramuscular hemorrhage History of endocarditis/mechanical AVR Recent right foot laceration History of stroke History of DVT Hyperlipidemia  Discharge Condition: Stable  Diet recommendation: Regular  Filed Weights   09/07/16 1035  Weight: 89.4 kg (197 lb)    History of present illness:  on 09/07/2016 by Dr. Rae Halsted Campis a 56 y.o.malewith medical history significant of status post aortic valve replacementin 2007and bacterial endocarditis resulting AVR 2010 as per patient with mechanical valve, hx of DVT, CVA,HLD,subacute bacterial endocarditis, ascending aorta aneurysm. Presented with right upper leg pain and swelling for the past 3 days patient had had recent right leg surgery for chainsaw injury in March 2018to the right lower extremity. Requiring skin graft. He's been laying down in antigravity chair most of the day but reports he gets off for 2 hours a day and becomes very active he gets on a scooter bike and moves around aggressively. Few days ago after this aggressive activity he developed groin pain since then Moving right hip is very uncomfortable. He was seen for this by orthopedics yesterday who did plain films which were negative. [facility] for father evaluationultrasound was obtained negative for DVT. CT worrisome for intramuscular  hematoma.   Hospital Course:  Hematoma/Right sided adductor muscle strain with intramuscular hemorrhage -Noted on CT pelvis with contrast -Likely secondary to hypercoagulopathy from Coumadin -Discussed with orthopedics, Dr. Marcelino Scot. Recommended conservative management. -Current neurologically intact -Hemoglobin stable, currently 12.1  History of endocarditis/mechanical AVR -Continue Coumadin, INR goal now between 2-3, given above findings -Echocardiogram EF of 7075%, wall motion was normal, no regional wall motion abnormalities, left ventricular diastolic parameters normal. Mechanical aortic valve well seated with normal function. Mitral valve was mildly calcified annulus, mildly thickened leaflets. -Blood culture showed no growth to date -Patient currently afebrile with no leukocytosis -Continue Coumadin per home regimen on discharge. Repeat INR on 09/10/2016  Recent right foot laceration -From chainsaw accident -Patient follows with [facility] -Continue wound care  History of stroke -Continue Coumadin  History of DVT -Continue Coumadin -Lower extremity Doppler showed no DVT  Hyperlipidemia -Continue statin  Consultants Orthopedics, Dr. Marcelino Scot  Procedures  Lower extremity doppler Echocardiogram  Discharge Exam: Vitals:   09/08/16 2152 09/09/16 0502  BP: 134/76 115/81  Pulse: 73 85  Resp: 16 16  Temp: 99 F (37.2 C) 98.3 F (36.8 C)   Patient feeling better today. Does complain of some muscle spasms in his leg as well as back. Denies chest pain, shortness breath, abdominal pain, nausea or vomiting, diarrhea or constipation.   General: Well developed, well nourished, NAD, appears stated age  56: NCAT,mucous membranes moist.  Cardiovascular: S1 S2 auscultated, RRR, mechanical click  Respiratory: Clear to auscultation bilaterally, no wheezing  Abdomen: Soft, nontender, nondistended, + bowel sounds  Extremities: warm dry without cyanosis clubbing  or edema. Ecchymosis on the right thigh, dressing on right lower extremity as well as right shin.   Neuro:  AAOx3, nonfocal  Psych: Normal affect and demeanor with intact judgement and insight, pleasant  Discharge Instructions Discharge Instructions    Discharge instructions    Complete by:  As directed    Patient will be discharged to home.  Patient will need to follow up with primary care provider within one week of discharge. Repeat INR on 09/10/2016. Follow up with surgeon. Continue current wound care. Patient should continue medications as prescribed.  Patient should follow a regular diet.     Current Discharge Medication List    CONTINUE these medications which have CHANGED   Details  cyclobenzaprine (FLEXERIL) 5 MG tablet Take 1 tablet (5 mg total) by mouth 3 (three) times daily as needed for muscle spasms. Qty: 45 tablet, Refills: 0      CONTINUE these medications which have NOT CHANGED   Details  acetaminophen (TYLENOL) 500 MG tablet Take 1,000 mg by mouth 4 (four) times daily as needed for pain.     ALPRAZolam (XANAX) 0.5 MG tablet Take 1 tablet (0.5 mg total) by mouth as needed for anxiety (every 12 hours). Qty: 10 tablet, Refills: 0   Associated Diagnoses: Long term current use of anticoagulant therapy; S/P AVR (aortic valve replacement) and aortoplasty    atorvastatin (LIPITOR) 10 MG tablet TAKE 1 TABLET (10 MG TOTAL) BY MOUTH DAILY. Qty: 90 tablet, Refills: 2    gabapentin (NEURONTIN) 800 MG tablet TAKE 1 TABLET TWICE A DAY Qty: 180 tablet, Refills: 2   Associated Diagnoses: Migraine without aura and without status migrainosus, not intractable    oxyCODONE (OXY IR/ROXICODONE) 5 MG immediate release tablet Take 1-2 tablets (5-10 mg total) by mouth every 3 (three) hours as needed for breakthrough pain. Qty: 30 tablet, Refills: 0    warfarin (COUMADIN) 5 MG tablet Take as directed by coumadin clinic Qty: 45 tablet, Refills: 3       Allergies  Allergen Reactions    . Tape Rash and Other (See Comments)    Other reaction(s): Other (See Comments) Makes skin raw Inflammation and TEARS OFF THE SKIN!!  . Tapentadol Other (See Comments) and Rash    Other reaction(s): Other (See Comments) Makes skin raw Inflammation and TEARS OFF THE SKIN!! Inflammation and TEARS OFF THE SKIN!!   Follow-up Information    [name], MD. Schedule an appointment as soon as possible for a visit in 1 week(s).   Specialty:  Family Medicine Why:  Hospital follow up Contact information: [place] [address] [phone]            The results of significant diagnostics from this hospitalization (including imaging, microbiology, ancillary and laboratory) are listed below for reference.    Significant Diagnostic Studies: Ct Pelvis W Contrast  Result Date: 09/07/2016 CLINICAL DATA:  Right groin pain, erythema superior to the mid thighs since Tuesday. Evaluate for hematoma. EXAM: CT PELVIS WITH CONTRAST TECHNIQUE: Multidetector CT imaging of the pelvis was performed using the standard protocol following the bolus administration of intravenous contrast. CONTRAST:  166mL ISOVUE-300 IOPAMIDOL (ISOVUE-300) INJECTION 61% COMPARISON:  None. FINDINGS: Urinary Tract:  No abnormality visualized. Bowel:  Unremarkable visualized pelvic bowel loops. Vascular/Lymphatic: No pathologically enlarged lymph nodes. No significant vascular abnormality seen. Reproductive:  No mass or other significant abnormality Other:  None. Musculoskeletal: Edematous appearance of the adductor muscles of the right thigh with subtle area of intra muscular hyperdensity consistent with intramuscular strain and slight hemorrhage, series 5, image 117. The bony pelvis, included lumbar spine, hip joints and  proximal femora appear intact without acute nor suspicious osseous abnormalities. IMPRESSION: 1. Findings consistent with moderate right-sided adductor muscle strain with subtle  intramuscular hemorrhage and edema noted within. 2. No acute nor suspicious osseous abnormality. Electronically Signed   By: Ashley Royalty M.D.   On: 09/07/2016 13:44   US Venous Img Lower Unilateral Right  Result Date: 09/07/2016 CLINICAL DATA:  56 year old male with right medial thigh pain redness and swelling for 4 days. Prior right lower extremity surgeries due to chainsaw accident, most recent surgery was earlier this month. EXAM: RIGHT LOWER EXTREMITY VENOUS DOPPLER ULTRASOUND TECHNIQUE: Gray-scale sonography with graded compression, as well as color Doppler and duplex ultrasound were performed to evaluate the lower extremity deep venous systems from the level of the common femoral vein and including the common femoral, femoral, profunda femoral, popliteal and calf veins including the posterior tibial, peroneal and gastrocnemius veins when visible. The superficial great saphenous vein was also interrogated. Spectral Doppler was utilized to evaluate flow at rest and with distal augmentation maneuvers in the common femoral, femoral and popliteal veins. COMPARISON:  Right foot radiographs 06/23/2016 FINDINGS: Contralateral Common Femoral Vein: Respiratory phasicity is normal and symmetric with the symptomatic side. No evidence of thrombus. Normal compressibility. Common Femoral Vein: No evidence of thrombus. Normal compressibility, respiratory phasicity and response to augmentation. Saphenofemoral Junction: No evidence of thrombus. Normal compressibility and flow on color Doppler imaging. Profunda Femoral Vein: No evidence of thrombus. Normal compressibility and flow on color Doppler imaging. Femoral Vein: No evidence of thrombus. Normal compressibility, respiratory phasicity and response to augmentation. Popliteal Vein: No evidence of thrombus. Normal compressibility, respiratory phasicity and response to augmentation. Calf Veins: No evidence of thrombus. Normal compressibility and flow on color Doppler  imaging. Superficial Great Saphenous Vein: No evidence of thrombus. Normal compressibility and flow on color Doppler imaging. Venous Reflux:  None. Other Findings:  None. IMPRESSION: No evidence of DVT within the right lower extremity. Electronically Signed   By: Genevie Ann M.D.   On: 09/07/2016 11:47   Dg Chest Port 1 View  Result Date: 09/08/2016 CLINICAL DATA:  Right upper leg pain and swelling, internal bleeding EXAM: PORTABLE CHEST 1 VIEW COMPARISON:  06/21/2008 FINDINGS: Postsurgical changes of the mediastinum. No acute infiltrate or effusion. Stable cardiomediastinal silhouette. No pneumothorax. IMPRESSION: No active disease. Electronically Signed   By: Donavan Foil M.D.   On: 09/08/2016 00:02    Microbiology: Recent Results (from the past 240 hour(s))  Culture, blood (Routine X 2) w Reflex to ID Panel     Status: None (Preliminary result)   Collection Time: 09/08/16  2:22 PM  Result Value Ref Range Status   Specimen Description BLOOD RIGHT ARM  Final   Special Requests   Final    BOTTLES DRAWN AEROBIC AND ANAEROBIC Blood Culture results may not be optimal due to an excessive volume of blood received in culture bottles   Culture   Final    NO GROWTH < 24 HOURS Performed at [facility], [place] [address], [place], [place] [address]    Report Status PENDING  Incomplete  Culture, blood (Routine X 2) w Reflex to ID Panel     Status: None (Preliminary result)   Collection Time: 09/08/16  2:28 PM  Result Value Ref Range Status   Specimen Description BLOOD RIGHT ARM  Final   Special Requests   Final    BOTTLES DRAWN AEROBIC AND ANAEROBIC Blood Culture results may not be optimal due to an excessive  volume of blood received in culture bottles   Culture   Final    NO GROWTH < 24 HOURS Performed at [facility], [place] [address], [place], [place] [address]    Report Status PENDING  Incomplete     Labs: Basic Metabolic Panel:  Recent Labs Lab 09/07/16 1044 09/08/16 0400  09/09/16 0453  NA 135 139 136  K 3.8 4.2 4.1  CL 101 100* 98*  CO2 28 30 31   GLUCOSE 107* 111* 110*  BUN 15 17 20   CREATININE 0.85 0.98 0.94  CALCIUM 8.8* 9.2 9.1  MG  --  1.9  --   PHOS  --  4.3  --    Liver Function Tests:  Recent Labs Lab 09/07/16 1044 09/08/16 0400  AST 50* 44*  ALT 45 39  ALKPHOS 64 60  BILITOT 1.5* 1.2  PROT 7.1 6.4*  ALBUMIN 4.1 3.9   No results for input(s): LIPASE, AMYLASE in the last 168 hours. No results for input(s): AMMONIA in the last 168 hours. CBC:  Recent Labs Lab 09/07/16 1044 09/07/16 2059 09/08/16 0400 09/08/16 1154 09/08/16 2045 09/09/16 0453  WBC 8.3 7.9 8.2 6.5 6.9 7.1  6.8  NEUTROABS 5.8  --   --   --   --   --   HGB 13.3 12.3* 11.9* 11.9* 11.8* 12.1*  11.9*  HCT 40.2 37.3* 37.2* 36.4* 36.3* 36.4*  36.5*  MCV 83.6 81.8 83.6 83.3 83.3 83.1  83.0  PLT 315 334 328 305 350 382  382   Cardiac Enzymes: No results for input(s): CKTOTAL, CKMB, CKMBINDEX, TROPONINI in the last 168 hours. BNP: BNP (last 3 results) No results for input(s): BNP in the last 8760 hours.  ProBNP (last 3 results) No results for input(s): PROBNP in the last 8760 hours.  CBG: No results for input(s): GLUCAP in the last 168 hours.     SignedCristal Ford  [facility] 09/09/2016, 12:27 PM

## 2016-09-09 NOTE — Progress Notes (Signed)
OT Cancellation Note  Patient Details Name: Robert [name] MRN: [id number] DOB: 1961/03/24   Cancelled Treatment:    Reason Eval/Treat Not Completed: Other (comment).  Checked in with pt. He was able to get up and walk with PT yesterday with min guard for safety due to NWB.  Pt and wife feel comfortable with ADLs/toilet transfers, and they have DME.  Pt will work with PT later today.  Will sign off  Jeanae [place] 09/09/2016, 10:28 AM  Lesle Chris, OTR/L [phone] 09/09/2016

## 2016-09-10 ENCOUNTER — Other Ambulatory Visit: Payer: 59

## 2016-09-10 ENCOUNTER — Ambulatory Visit (INDEPENDENT_AMBULATORY_CARE_PROVIDER_SITE_OTHER): Payer: 59 | Admitting: Pharmacist

## 2016-09-10 ENCOUNTER — Telehealth: Payer: Self-pay | Admitting: Family Medicine

## 2016-09-10 ENCOUNTER — Telehealth: Payer: Self-pay | Admitting: Cardiology

## 2016-09-10 DIAGNOSIS — Z952 Presence of prosthetic heart valve: Secondary | ICD-10-CM | POA: Diagnosis not present

## 2016-09-10 DIAGNOSIS — I359 Nonrheumatic aortic valve disorder, unspecified: Secondary | ICD-10-CM

## 2016-09-10 DIAGNOSIS — Z7901 Long term (current) use of anticoagulants: Secondary | ICD-10-CM | POA: Diagnosis not present

## 2016-09-10 DIAGNOSIS — I6350 Cerebral infarction due to unspecified occlusion or stenosis of unspecified cerebral artery: Secondary | ICD-10-CM

## 2016-09-10 DIAGNOSIS — Z8673 Personal history of transient ischemic attack (TIA), and cerebral infarction without residual deficits: Secondary | ICD-10-CM

## 2016-09-10 LAB — POCT INR: INR: 1.4

## 2016-09-10 MED ORDER — ENOXAPARIN SODIUM 80 MG/0.8ML ~~LOC~~ SOLN
80.0000 mg | Freq: Two times a day (BID) | SUBCUTANEOUS | 0 refills | Status: DC
Start: 2016-09-10 — End: 2017-05-21

## 2016-09-10 NOTE — Telephone Encounter (Signed)
New message    Wife calling went to hospital Thursday due to heavy bleeding discharge from [facility] on last night .    Wife was told patient need to have lab drawn today because he was at a 4 .    Per wife yesterday INR check was 1.9

## 2016-09-10 NOTE — Telephone Encounter (Signed)
Spoke with wife and pt started warfarin last night. Pt will come to have INR checked in office today.

## 2016-09-10 NOTE — Telephone Encounter (Signed)
Called pt for TOC/hospital follow up, pt has had a rough time and with lots of issues that have happened and will come in for follow up on Thursday.  He has all his medications and understand how to take them and will call if he needs anything before his appt.  He is also following up with his cardiologist.

## 2016-09-13 ENCOUNTER — Encounter: Payer: Self-pay | Admitting: Family Medicine

## 2016-09-13 ENCOUNTER — Ambulatory Visit (INDEPENDENT_AMBULATORY_CARE_PROVIDER_SITE_OTHER): Payer: 59 | Admitting: Family Medicine

## 2016-09-13 VITALS — BP 120/70 | HR 75

## 2016-09-13 DIAGNOSIS — Z7901 Long term (current) use of anticoagulants: Secondary | ICD-10-CM | POA: Diagnosis not present

## 2016-09-13 DIAGNOSIS — S8011XA Contusion of right lower leg, initial encounter: Secondary | ICD-10-CM | POA: Diagnosis not present

## 2016-09-13 DIAGNOSIS — Z952 Presence of prosthetic heart valve: Secondary | ICD-10-CM

## 2016-09-13 DIAGNOSIS — S96921A Laceration of unspecified muscle and tendon at ankle and foot level, right foot, initial encounter: Secondary | ICD-10-CM

## 2016-09-13 DIAGNOSIS — S91311A Laceration without foreign body, right foot, initial encounter: Secondary | ICD-10-CM

## 2016-09-13 LAB — CULTURE, BLOOD (ROUTINE X 2)
CULTURE: NO GROWTH
Culture: NO GROWTH

## 2016-09-13 NOTE — Progress Notes (Signed)
   Subjective:    Patient ID: Robert [name], male    DOB: [birth year], 56 y.o.   MRN: [id number]  HPI He is here for follow-up visit. He initially injured his right foot March 31 doing a lot of extensor tendon and skin damage requiring him to be seen at [facility]. On April 18 he did have tendon repair. Skin grafting was then also accomplished on June 4. On June 12 he sustained an abductor injury to the right abductor muscles when he was getting out of bed. He is presently on Coumadin and causing significant amount of bleeding. His Coumadin was stopped for several days. He is now on Lovenox to bridge until his Coumadin dosing can be adjusted.   Review of Systems     Objective:   Physical Exam Alert and in no distress. Right abductor area is ecchymotic and tender to palpation. Scrotal area was clear. Right foot was not examined due to it's still being wrapped.       Assessment & Plan:  History of aortic valve replacement  Long term current use of anticoagulant therapy  Hematoma of leg, right, initial encounter  Foot laceration involving tendon, right, initial encounter He has a good handle on how to handle this. He will continue on his Coumadin and follow-up with his other specialists and at [facility]. Did recommend using heat for 20 minutes 3 times per day on the ecchymotic area to help with its healing.

## 2016-09-13 NOTE — Patient Instructions (Signed)
Hot wet towel to the area 20 minutes 3 times per day

## 2016-09-14 ENCOUNTER — Ambulatory Visit (INDEPENDENT_AMBULATORY_CARE_PROVIDER_SITE_OTHER): Payer: 59 | Admitting: *Deleted

## 2016-09-14 ENCOUNTER — Telehealth: Payer: Self-pay | Admitting: Cardiology

## 2016-09-14 DIAGNOSIS — Z7901 Long term (current) use of anticoagulants: Secondary | ICD-10-CM | POA: Diagnosis not present

## 2016-09-14 DIAGNOSIS — Z8673 Personal history of transient ischemic attack (TIA), and cerebral infarction without residual deficits: Secondary | ICD-10-CM

## 2016-09-14 DIAGNOSIS — I6350 Cerebral infarction due to unspecified occlusion or stenosis of unspecified cerebral artery: Secondary | ICD-10-CM | POA: Diagnosis not present

## 2016-09-14 DIAGNOSIS — Z952 Presence of prosthetic heart valve: Secondary | ICD-10-CM

## 2016-09-14 DIAGNOSIS — I359 Nonrheumatic aortic valve disorder, unspecified: Secondary | ICD-10-CM | POA: Diagnosis not present

## 2016-09-14 LAB — POCT INR: INR: 3.5

## 2016-09-14 NOTE — Telephone Encounter (Signed)
Patient came in to office wanted to cancel the Echo. He had the echo done at the hospital last week. He just wanted to make Dr [name] aware.   Robert [name]

## 2016-09-14 NOTE — Telephone Encounter (Signed)
Will send this as an FYI to Dr [name].

## 2016-09-14 NOTE — Telephone Encounter (Signed)
Great, he has normal LVEF and normally functioning aortic prosthesis with normal and unchanged gradients.

## 2016-09-14 NOTE — Telephone Encounter (Signed)
Pt aware of echo results per Dr [name].  Pt verbalized understanding.

## 2016-09-18 ENCOUNTER — Other Ambulatory Visit (HOSPITAL_COMMUNITY): Payer: 59

## 2016-09-20 ENCOUNTER — Other Ambulatory Visit: Payer: Self-pay | Admitting: Cardiology

## 2016-09-25 DIAGNOSIS — S91301D Unspecified open wound, right foot, subsequent encounter: Secondary | ICD-10-CM | POA: Diagnosis not present

## 2016-10-02 DIAGNOSIS — H25013 Cortical age-related cataract, bilateral: Secondary | ICD-10-CM | POA: Diagnosis not present

## 2016-10-02 DIAGNOSIS — H40013 Open angle with borderline findings, low risk, bilateral: Secondary | ICD-10-CM | POA: Diagnosis not present

## 2016-10-02 DIAGNOSIS — H2513 Age-related nuclear cataract, bilateral: Secondary | ICD-10-CM | POA: Diagnosis not present

## 2016-10-03 ENCOUNTER — Ambulatory Visit (INDEPENDENT_AMBULATORY_CARE_PROVIDER_SITE_OTHER): Payer: 59 | Admitting: *Deleted

## 2016-10-03 DIAGNOSIS — Z7901 Long term (current) use of anticoagulants: Secondary | ICD-10-CM | POA: Diagnosis not present

## 2016-10-03 DIAGNOSIS — I359 Nonrheumatic aortic valve disorder, unspecified: Secondary | ICD-10-CM | POA: Diagnosis not present

## 2016-10-03 DIAGNOSIS — Z8673 Personal history of transient ischemic attack (TIA), and cerebral infarction without residual deficits: Secondary | ICD-10-CM | POA: Diagnosis not present

## 2016-10-03 DIAGNOSIS — I6350 Cerebral infarction due to unspecified occlusion or stenosis of unspecified cerebral artery: Secondary | ICD-10-CM

## 2016-10-03 DIAGNOSIS — Z952 Presence of prosthetic heart valve: Secondary | ICD-10-CM

## 2016-10-03 LAB — POCT INR: INR: 5.8

## 2016-11-15 NOTE — Addendum Note (Signed)
Addendum  created 11/15/16 1201 by Roberts Gaudy, MD   Sign clinical note

## 2016-12-19 ENCOUNTER — Ambulatory Visit (INDEPENDENT_AMBULATORY_CARE_PROVIDER_SITE_OTHER): Payer: 59 | Admitting: *Deleted

## 2016-12-19 DIAGNOSIS — I359 Nonrheumatic aortic valve disorder, unspecified: Secondary | ICD-10-CM

## 2016-12-19 DIAGNOSIS — Z952 Presence of prosthetic heart valve: Secondary | ICD-10-CM

## 2016-12-19 DIAGNOSIS — Z7901 Long term (current) use of anticoagulants: Secondary | ICD-10-CM

## 2016-12-19 DIAGNOSIS — Z8673 Personal history of transient ischemic attack (TIA), and cerebral infarction without residual deficits: Secondary | ICD-10-CM | POA: Diagnosis not present

## 2016-12-19 DIAGNOSIS — Z5181 Encounter for therapeutic drug level monitoring: Secondary | ICD-10-CM

## 2016-12-19 DIAGNOSIS — I6350 Cerebral infarction due to unspecified occlusion or stenosis of unspecified cerebral artery: Secondary | ICD-10-CM

## 2016-12-19 LAB — POCT INR: INR: 3.2

## 2017-01-16 ENCOUNTER — Ambulatory Visit (INDEPENDENT_AMBULATORY_CARE_PROVIDER_SITE_OTHER): Payer: 59 | Admitting: *Deleted

## 2017-01-16 ENCOUNTER — Other Ambulatory Visit: Payer: Self-pay | Admitting: Cardiology

## 2017-01-16 DIAGNOSIS — Z8673 Personal history of transient ischemic attack (TIA), and cerebral infarction without residual deficits: Secondary | ICD-10-CM | POA: Diagnosis not present

## 2017-01-16 DIAGNOSIS — Z952 Presence of prosthetic heart valve: Secondary | ICD-10-CM

## 2017-01-16 DIAGNOSIS — Z7901 Long term (current) use of anticoagulants: Secondary | ICD-10-CM

## 2017-01-16 DIAGNOSIS — I359 Nonrheumatic aortic valve disorder, unspecified: Secondary | ICD-10-CM

## 2017-01-16 DIAGNOSIS — Z5181 Encounter for therapeutic drug level monitoring: Secondary | ICD-10-CM

## 2017-01-16 DIAGNOSIS — I6350 Cerebral infarction due to unspecified occlusion or stenosis of unspecified cerebral artery: Secondary | ICD-10-CM

## 2017-01-16 LAB — PROTIME-INR
INR: 5.3 — ABNORMAL HIGH (ref 0.8–1.2)
Prothrombin Time: 56.1 s — ABNORMAL HIGH (ref 9.1–12.0)

## 2017-01-16 LAB — POCT INR: INR: 6.1

## 2017-01-23 ENCOUNTER — Ambulatory Visit (INDEPENDENT_AMBULATORY_CARE_PROVIDER_SITE_OTHER): Payer: 59 | Admitting: *Deleted

## 2017-01-23 ENCOUNTER — Encounter (INDEPENDENT_AMBULATORY_CARE_PROVIDER_SITE_OTHER): Payer: Self-pay

## 2017-01-23 DIAGNOSIS — Z952 Presence of prosthetic heart valve: Secondary | ICD-10-CM | POA: Diagnosis not present

## 2017-01-23 DIAGNOSIS — Z7901 Long term (current) use of anticoagulants: Secondary | ICD-10-CM | POA: Diagnosis not present

## 2017-01-23 DIAGNOSIS — I359 Nonrheumatic aortic valve disorder, unspecified: Secondary | ICD-10-CM | POA: Diagnosis not present

## 2017-01-23 DIAGNOSIS — Z8673 Personal history of transient ischemic attack (TIA), and cerebral infarction without residual deficits: Secondary | ICD-10-CM | POA: Diagnosis not present

## 2017-01-23 DIAGNOSIS — I6350 Cerebral infarction due to unspecified occlusion or stenosis of unspecified cerebral artery: Secondary | ICD-10-CM

## 2017-01-23 LAB — POCT INR: INR: 3.8

## 2017-02-06 ENCOUNTER — Ambulatory Visit (INDEPENDENT_AMBULATORY_CARE_PROVIDER_SITE_OTHER): Payer: 59 | Admitting: *Deleted

## 2017-02-06 DIAGNOSIS — Z952 Presence of prosthetic heart valve: Secondary | ICD-10-CM

## 2017-02-06 DIAGNOSIS — Z7901 Long term (current) use of anticoagulants: Secondary | ICD-10-CM

## 2017-02-06 DIAGNOSIS — Z8673 Personal history of transient ischemic attack (TIA), and cerebral infarction without residual deficits: Secondary | ICD-10-CM | POA: Diagnosis not present

## 2017-02-06 LAB — POCT INR: INR: 4.5

## 2017-02-06 NOTE — Patient Instructions (Signed)
Skip today's dose, then start taking 1.5 tablets (7.5mg ) daily except 1 tablet (5mg ) on Mondays, Wednesdays and Fridays.  Recheck INR in 2 weeks.

## 2017-02-20 ENCOUNTER — Ambulatory Visit (INDEPENDENT_AMBULATORY_CARE_PROVIDER_SITE_OTHER): Payer: 59

## 2017-02-20 DIAGNOSIS — Z952 Presence of prosthetic heart valve: Secondary | ICD-10-CM | POA: Diagnosis not present

## 2017-02-20 DIAGNOSIS — Z8673 Personal history of transient ischemic attack (TIA), and cerebral infarction without residual deficits: Secondary | ICD-10-CM

## 2017-02-20 DIAGNOSIS — Z7901 Long term (current) use of anticoagulants: Secondary | ICD-10-CM

## 2017-02-20 LAB — POCT INR: INR: 2.8

## 2017-02-20 NOTE — Patient Instructions (Signed)
Continue on same dosage 1.5 tablets (7.5mg ) daily except 1 tablet (5mg ) on Mondays, Wednesdays and Fridays.  Recheck INR in 3 weeks.

## 2017-03-13 ENCOUNTER — Ambulatory Visit (INDEPENDENT_AMBULATORY_CARE_PROVIDER_SITE_OTHER): Payer: 59 | Admitting: Pharmacist

## 2017-03-13 DIAGNOSIS — Z8673 Personal history of transient ischemic attack (TIA), and cerebral infarction without residual deficits: Secondary | ICD-10-CM | POA: Diagnosis not present

## 2017-03-13 DIAGNOSIS — Z952 Presence of prosthetic heart valve: Secondary | ICD-10-CM | POA: Diagnosis not present

## 2017-03-13 DIAGNOSIS — Z7901 Long term (current) use of anticoagulants: Secondary | ICD-10-CM

## 2017-03-13 LAB — POCT INR: INR: 3.6

## 2017-03-13 NOTE — Patient Instructions (Signed)
Description   Skip today then Continue on same dosage 1.5 tablets (7.5mg ) daily except 1 tablet (5mg ) on Mondays, Wednesdays and Fridays.  Recheck INR in 3 weeks.

## 2017-04-03 ENCOUNTER — Ambulatory Visit (INDEPENDENT_AMBULATORY_CARE_PROVIDER_SITE_OTHER): Payer: 59 | Admitting: Pharmacist

## 2017-04-03 DIAGNOSIS — Z8673 Personal history of transient ischemic attack (TIA), and cerebral infarction without residual deficits: Secondary | ICD-10-CM

## 2017-04-03 DIAGNOSIS — Z952 Presence of prosthetic heart valve: Secondary | ICD-10-CM | POA: Diagnosis not present

## 2017-04-03 DIAGNOSIS — Z7901 Long term (current) use of anticoagulants: Secondary | ICD-10-CM | POA: Diagnosis not present

## 2017-04-03 LAB — POCT INR: INR: 2.3

## 2017-04-03 NOTE — Patient Instructions (Signed)
Description   Take 1.5 tablets today, then continue on same dosage 1.5 tablets (7.5mg ) daily except 1 tablet (5mg ) on Mondays, Wednesdays and Fridays.  Recheck INR in 3 weeks.

## 2017-04-09 DIAGNOSIS — S91311D Laceration without foreign body, right foot, subsequent encounter: Secondary | ICD-10-CM | POA: Diagnosis not present

## 2017-04-09 DIAGNOSIS — S96129D Laceration of muscle and tendon of long extensor muscle of toe at ankle and foot level, unspecified foot, subsequent encounter: Secondary | ICD-10-CM | POA: Diagnosis not present

## 2017-04-24 ENCOUNTER — Encounter (INDEPENDENT_AMBULATORY_CARE_PROVIDER_SITE_OTHER): Payer: Self-pay

## 2017-04-24 ENCOUNTER — Ambulatory Visit (INDEPENDENT_AMBULATORY_CARE_PROVIDER_SITE_OTHER): Payer: 59 | Admitting: *Deleted

## 2017-04-24 DIAGNOSIS — Z8673 Personal history of transient ischemic attack (TIA), and cerebral infarction without residual deficits: Secondary | ICD-10-CM | POA: Diagnosis not present

## 2017-04-24 DIAGNOSIS — Z7901 Long term (current) use of anticoagulants: Secondary | ICD-10-CM

## 2017-04-24 DIAGNOSIS — Z952 Presence of prosthetic heart valve: Secondary | ICD-10-CM | POA: Diagnosis not present

## 2017-04-24 DIAGNOSIS — Z5181 Encounter for therapeutic drug level monitoring: Secondary | ICD-10-CM

## 2017-04-24 LAB — POCT INR: INR: 2.9

## 2017-04-24 NOTE — Patient Instructions (Signed)
Description   Continue on same dosage 1.5 tablets (7.5mg) daily except 1 tablet (5mg) on Mondays, Wednesdays and Fridays.  Recheck INR in 4 weeks.      

## 2017-05-01 ENCOUNTER — Ambulatory Visit: Payer: 59 | Admitting: Cardiology

## 2017-05-05 ENCOUNTER — Encounter: Payer: Self-pay | Admitting: Internal Medicine

## 2017-05-14 ENCOUNTER — Encounter: Payer: Self-pay | Admitting: Internal Medicine

## 2017-05-16 ENCOUNTER — Telehealth: Payer: Self-pay | Admitting: Cardiology

## 2017-05-16 NOTE — Telephone Encounter (Signed)
Returned call to pt and moved Coumadin appt up 1 day to coincide with Dr Francesca Oman appt.

## 2017-05-16 NOTE — Telephone Encounter (Signed)
Patient would like to see if he can be worked in on 05-21-17, he has appt with Dr.Nelson 05-21-17 @ 10:40am.

## 2017-05-21 ENCOUNTER — Ambulatory Visit (INDEPENDENT_AMBULATORY_CARE_PROVIDER_SITE_OTHER): Payer: 59 | Admitting: Cardiology

## 2017-05-21 ENCOUNTER — Ambulatory Visit (INDEPENDENT_AMBULATORY_CARE_PROVIDER_SITE_OTHER): Payer: 59 | Admitting: *Deleted

## 2017-05-21 ENCOUNTER — Ambulatory Visit: Payer: 59 | Admitting: Physician Assistant

## 2017-05-21 ENCOUNTER — Encounter: Payer: Self-pay | Admitting: Cardiology

## 2017-05-21 VITALS — BP 100/68 | HR 64 | Ht 71.0 in | Wt 176.0 lb

## 2017-05-21 DIAGNOSIS — I10 Essential (primary) hypertension: Secondary | ICD-10-CM | POA: Diagnosis not present

## 2017-05-21 DIAGNOSIS — Z7901 Long term (current) use of anticoagulants: Secondary | ICD-10-CM | POA: Diagnosis not present

## 2017-05-21 DIAGNOSIS — E782 Mixed hyperlipidemia: Secondary | ICD-10-CM

## 2017-05-21 DIAGNOSIS — Z8673 Personal history of transient ischemic attack (TIA), and cerebral infarction without residual deficits: Secondary | ICD-10-CM | POA: Diagnosis not present

## 2017-05-21 DIAGNOSIS — Z952 Presence of prosthetic heart valve: Secondary | ICD-10-CM

## 2017-05-21 LAB — POCT INR: INR: 3.2

## 2017-05-21 NOTE — Patient Instructions (Signed)

## 2017-05-21 NOTE — Patient Instructions (Signed)
Description   Continue on same dosage 1.5 tablets (7.5mg) daily except 1 tablet (5mg) on Mondays, Wednesdays and Fridays.  Recheck INR in 4 weeks.      

## 2017-05-21 NOTE — Progress Notes (Signed)
Patient ID: Robert [name], male   DOB: 1960-11-08, 57 y.o.   MRN: [id number]    Patient Name: Robert [name] Date of Encounter: 05/21/2017  Primary Care Provider:  [name], MD Primary Cardiologist:  Ena Dawley [facility] Dr. Verl Blalock)  Chief complaint: 8 months follow up  Problem List   Past Medical History:  Diagnosis Date  . Aneurysm of ascending aorta (HCC)   . Blood transfusion without reported diagnosis   . Cataract    BEGINNING  . CVA (cerebral infarction)   . DVT (deep venous thrombosis) ([place])   . Hx of adenomatous colonic polyps 05/11/2014  . Hyperlipidemia   . Migraine headache   . Renal stone   . S/P aortic valve replacement   . Stroke ([place])   . Subacute bacterial endocarditis (SBE)    Past Surgical History:  Procedure Laterality Date  . ABDOMINAL AORTIC ANEURYSM REPAIR    . AORTIC VALVE REPLACEMENT  2007  . CARDIAC CATHETERIZATION  01/2005  . COLONOSCOPY    . I&D EXTREMITY Right 06/23/2016   Procedure: IRRIGATION AND DEBRIDEMENT FOOT;  Surgeon: Netta Cedars, MD;  Location: [place];  Service: Orthopedics;  Laterality: Right;  . TENDON REPAIR Right 06/23/2016   Procedure: TENDON REPAIR;  Surgeon: Netta Cedars, MD;  Location: [place];  Service: Orthopedics;  Laterality: Right;    Allergies  Allergies  Allergen Reactions  . Tape Rash and Other (See Comments)    Other reaction(s): Other (See Comments) Makes skin raw Inflammation and TEARS OFF THE SKIN!!  . Tapentadol Other (See Comments) and Rash    Other reaction(s): Other (See Comments) Makes skin raw Inflammation and TEARS OFF THE SKIN!! Inflammation and TEARS OFF THE SKIN!!   HPI  57 year old gentleman with prior medical history of bicuspid aortic valve and dilated ascending aorta that underwent aortic valve and root replacement in 2007. 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  The patient is coming after one year, he continues to be very active even though lately he slowed down. He denies any palpitations or syncope. He is very compliant with his Coumadin and denies any bleeding. He denies any orthopnea, paroxysmal nocturnal dyspnea. One episode of chest pain while he was arguing with his wife otherwise no exertional chest pain or dyspnea on exertion. No claudications or lower extremity edema.   09/05/2016, the patient is coming after 6 months, he has been doing well from cardiac standpoint, however he has had accident with a chain saw hurting his her right foot, ever since then he has underwent 3 surgeries including tendon repair. He has been in excruciating pain leading to anxiety and nights. He is otherwise in great spirits and tries to exercise in order to not to lose muscle strengths. He denies any chest pain, shortness of breath no lower extremity edema no orthopnea no palpitations dizziness or syncope. He has been compliant with his meds and the only side effect is bruising from Lovenox. His INR has been always above 2.  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  Home Medications  Prior to Admission medications   Medication Sig Start  Date End Date Taking? Authorizing Provider  gabapentin (NEURONTIN) 800 MG tablet Take 1 tablet (800 mg total) by mouth 2 (two) times daily. 04/29/13  Yes Dennie Bible, NP  warfarin (COUMADIN) 5 MG tablet 6.25 mg alternate with 5 mg 02/09/13  Yes Dorothy Spark, MD   Family History  Family History  Problem Relation Age of Onset  . CVA Mother 103       hemorrhagic cva  . Colon cancer Paternal Grandfather   . Colon cancer Paternal Grandmother   .  Diabetes Neg Hx   . Kidney disease Neg Hx   . Esophageal cancer Neg Hx   . Gallbladder disease Neg Hx   . Heart disease Neg Hx     Social History  Social History   Socioeconomic History  . Marital status: Married    Spouse name: [name]  . Number of children: 8  . Years of education: BSEE  . Highest education level: Not on file  Social Needs  . Financial resource strain: Not on file  . Food insecurity - worry: Not on file  . Food insecurity - inability: Not on file  . Transportation needs - medical: Not on file  . Transportation needs - non-medical: Not on file  Occupational History  . Occupation: Scientist, forensic: [employer]  Tobacco Use  . Smoking status: Never Smoker  . Smokeless tobacco: Never Used  Substance and Sexual Activity  . Alcohol use: Yes    Alcohol/week: 1.2 oz    Types: 2 Standard drinks or equivalent per week    Comment: Drinks 1 beer every 4 days  . Drug use: No  . Sexual activity: Not on file  Other Topics Concern  . Not on file  Social History Narrative   Patient lives at home with his wife [name].    Patient has 8 children. Boys   Patient is an Art gallery manager.          Review of Systems, as per HPI, otherwise negative General:  No chills, fever, night sweats or weight changes.  Cardiovascular:  No chest pain, dyspnea on exertion, edema, orthopnea, palpitations, paroxysmal nocturnal dyspnea. Dermatological: No rash, lesions/masses Respiratory: No cough, dyspnea Urologic: No hematuria, dysuria Abdominal:   No nausea, vomiting, diarrhea, bright red blood per rectum, melena, or hematemesis Neurologic:  No visual changes, wkns, changes in mental status. All other systems reviewed and are otherwise negative except as noted above.  Physical Exam  Height 5\' 11"  (1.803 m).  General: Pleasant, NAD Psych: Normal affect. Neuro: Alert and oriented X 3. Moves all extremities spontaneously. HEENT:  Normal  Neck: Supple without bruits or JVD. Lungs:  Resp regular and unlabored, CTA. Heart: RRR no s3, s4, or murmurs. Loud clicks of mechanical valve. Abdomen: Soft, non-tender, non-distended, BS + x 4.  Extremities: No clubbing, cyanosis or edema. DP/PT/Radials 2+ and equal bilaterally.  Labs:  No results for input(s): CKTOTAL, CKMB, TROPONINI in the last 72 hours. Lab Results  Component Value Date   WBC 7.5 09/09/2016   HGB 11.6 (L) 09/09/2016   HCT 35.1 (L) 09/09/2016   MCV 82.8 09/09/2016   PLT 359 09/09/2016    Lab Results  Component Value Date   DDIMER (H) 06/07/2008    0.76        AT THE INHOUSE ESTABLISHED CUTOFF VALUE OF 0.48 ug/mL FEU, THIS ASSAY HAS BEEN DOCUMENTED IN THE LITERATURE TO HAVE A SENSITIVITY AND NEGATIVE PREDICTIVE  VALUE OF AT LEAST 98 TO 99%.  THE TEST RESULT SHOULD BE CORRELATED WITH AN ASSESSMENT OF THE CLINICAL PROBABILITY OF DVT / VTE.   Invalid input(s): POCBNP    Component Value Date/Time   NA 136 09/09/2016 0453   K 4.1 09/09/2016 0453   CL 98 (L) 09/09/2016 0453   CO2 31 09/09/2016 0453   GLUCOSE 110 (H) 09/09/2016 0453   BUN 20 09/09/2016 0453   CREATININE 0.94 09/09/2016 0453   CREATININE 0.75 02/23/2015 0001   CALCIUM 9.1 09/09/2016 0453   PROT 6.4 (L) 09/08/2016 0400   ALBUMIN 3.9 09/08/2016 0400   AST 44 (H) 09/08/2016 0400   ALT 39 09/08/2016 0400   ALKPHOS 60 09/08/2016 0400   BILITOT 1.2 09/08/2016 0400   GFRNONAA >60 09/09/2016 0453   GFRAA >60 09/09/2016 0453   Lab Results  Component Value Date   CHOL 154 02/23/2015   HDL 55 02/23/2015   LDLCALC 83 02/23/2015   TRIG 82 02/23/2015   Accessory Clinical Findings  Echocardiogram 2015  - Left ventricle: The cavity size was normal. There was mild focal basal hypertrophy of the septum. Systolic function was normal. The estimated ejection fraction was in the range of 60% to 65%. Wall motion was normal; there were no regional wall motion abnormalities.  Features are consistent with a pseudonormal left ventricular filling pattern, with concomitant abnormal relaxation and increased filling pressure (grade 2 diastolic dysfunction).  - Aortic valve: A bioprosthesis was present. Trivial regurgitation. - Aortic root: The aortic root was mildly dilated. Impressions:  - Compared to 06/08/08, LV function remains normal; bioprosthetic aortic valve not well visualized but gradients are normal; if clinically indicated, TEE would have greater sensitivity for SOE.  ECG - performed today 09/05/2016 shows normal sinus rhythm, first-degree AV block is resolved, normal EKG.    Assessment & Plan   1. History of bicuspid aortic valve and aortic root dilatation status post AVR and aortic root replacement in 2007 and AVR with mechanical bicuspid valve in 2010 for infection complicated by embolic events. Patient has chronic Coumadin and he is very compliant. Her last echocardiogram in 2015 shows well functioning AV prosthesis with normal transaortic gradients. No recent change in symptoms, no echo indicated. Continue warfarin, his hemoglobin is stable, INR therapeutic. Lovenox for bridging. He has no significant murmur however he hasn't had any echocardiogram in the last 3 years we'll repeat to reevaluate his transaortic gradients.  2. Blood pressure - controlled.  3. Lipids - all at goal in 12/2014 - continue atorvastatin 10 mg po daily.  4. Foot surgery with pain anxiety, will prescribe limited Xanax 0.5 mg by mouth when necessary 10 pills total no refills. He's advised not to take it long-term. He is also advised that he has any signs of sepsis she should contact us for antibiotics to prevent endocarditis.  Follow-up in 6 months.  Ena Dawley, MD, [facility] 05/21/2017, 10:52 AM    Patient ID: Madaline Savage, male   DOB: 03-23-1961, 57 y.o.   MRN: [id number]    Patient Name: Robert [name] Date of Encounter: 05/21/2017  Primary Care  Provider:  [name], MD Primary Cardiologist:  Ena Dawley [facility] Dr. Verl Blalock)  Chief complaint: 6 months follow-up  Problem List   Past Medical History:  Diagnosis Date  . Aneurysm of ascending aorta (HCC)   . Blood transfusion without reported diagnosis   . Cataract    BEGINNING  . CVA (cerebral infarction)   . DVT (  deep venous thrombosis) ([place])   . Hx of adenomatous colonic polyps 05/11/2014  . Hyperlipidemia   . Migraine headache   . Renal stone   . S/P aortic valve replacement   . Stroke ([place])   . Subacute bacterial endocarditis (SBE)    Past Surgical History:  Procedure Laterality Date  . ABDOMINAL AORTIC ANEURYSM REPAIR    . AORTIC VALVE REPLACEMENT  2007  . CARDIAC CATHETERIZATION  01/2005  . COLONOSCOPY    . I&D EXTREMITY Right 06/23/2016   Procedure: IRRIGATION AND DEBRIDEMENT FOOT;  Surgeon: Netta Cedars, MD;  Location: [place];  Service: Orthopedics;  Laterality: Right;  . TENDON REPAIR Right 06/23/2016   Procedure: TENDON REPAIR;  Surgeon: Netta Cedars, MD;  Location: [place];  Service: Orthopedics;  Laterality: Right;    Allergies  Allergies  Allergen Reactions  . Tape Rash and Other (See Comments)    Other reaction(s): Other (See Comments) Makes skin raw Inflammation and TEARS OFF THE SKIN!!  . Tapentadol Other (See Comments) and Rash    Other reaction(s): Other (See Comments) Makes skin raw Inflammation and TEARS OFF THE SKIN!! Inflammation and TEARS OFF THE SKIN!!    HPI  57 year old gentleman with prior medical history of bicuspid aortic valve and dilated ascending aorta that underwent aortic valve and root replacement in 2007. 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  The patient is coming after one year, he continues to be very active even though  lately he slowed down. He denies any palpitations or syncope. He is very compliant with his Coumadin and denies any bleeding. He denies any orthopnea, paroxysmal nocturnal dyspnea. One episode of chest pain while he was arguing with his wife otherwise no exertional chest pain or dyspnea on exertion. No claudications or lower extremity edema.   09/05/2016, the patient is coming after 6 months, he has been doing well from cardiac standpoint, however he has had accident with a chain saw hurting his her right foot, ever since then he has underwent 3 surgeries including tendon repair. He has been in excruciating pain leading to anxiety and nights. He is otherwise in great spirits and tries to exercise in order to not to lose muscle strengths. He denies any chest pain, shortness of breath no lower extremity edema no orthopnea no palpitations dizziness or syncope. He has been compliant with his meds and the only side effect is bruising from Lovenox. His INR has been always above 2.  Home Medications  Prior to Admission medications   Medication Sig Start Date End Date Taking? Authorizing Provider  gabapentin (NEURONTIN) 800 MG tablet Take 1 tablet (800 mg total) by mouth 2 (two) times daily. 04/29/13  Yes Dennie Bible, NP  warfarin (COUMADIN) 5 MG tablet 6.25 mg alternate with 5 mg 02/09/13  Yes Dorothy Spark, MD   Family History  Family History  Problem Relation Age of Onset  . CVA Mother 7       hemorrhagic cva  . Colon cancer Paternal Grandfather   . Colon cancer Paternal Grandmother   . Diabetes Neg Hx   . Kidney disease Neg Hx   . Esophageal cancer Neg Hx   . Gallbladder disease Neg Hx   . Heart disease Neg Hx     Social History  Social History   Socioeconomic History  . Marital status: Married  Spouse name: [name]  . Number of children: 8  . Years of education: BSEE  . Highest education level: Not on file  Social Needs  . Financial resource strain: Not on file   . Food insecurity - worry: Not on file  . Food insecurity - inability: Not on file  . Transportation needs - medical: Not on file  . Transportation needs - non-medical: Not on file  Occupational History  . Occupation: Scientist, forensic: [employer]  Tobacco Use  . Smoking status: Never Smoker  . Smokeless tobacco: Never Used  Substance and Sexual Activity  . Alcohol use: Yes    Alcohol/week: 1.2 oz    Types: 2 Standard drinks or equivalent per week    Comment: Drinks 1 beer every 4 days  . Drug use: No  . Sexual activity: Not on file  Other Topics Concern  . Not on file  Social History Narrative   Patient lives at home with his wife [name].    Patient has 8 children. Boys   Patient is an Art gallery manager.          Review of Systems, as per HPI, otherwise negative General:  No chills, fever, night sweats or weight changes.  Cardiovascular:  No chest pain, dyspnea on exertion, edema, orthopnea, palpitations, paroxysmal nocturnal dyspnea. Dermatological: No rash, lesions/masses Respiratory: No cough, dyspnea Urologic: No hematuria, dysuria Abdominal:   No nausea, vomiting, diarrhea, bright red blood per rectum, melena, or hematemesis Neurologic:  No visual changes, wkns, changes in mental status. All other systems reviewed and are otherwise negative except as noted above.  Physical Exam  Blood pressure 100/68, pulse 64, height 5\' 11"  (1.803 m), weight 176 lb (79.8 kg), SpO2 97 %.  General: Pleasant, NAD Psych: Normal affect. Neuro: Alert and oriented X 3. Moves all extremities spontaneously. HEENT: Normal  Neck: Supple without bruits or JVD. Lungs:  Resp regular and unlabored, CTA. Heart: RRR no s3, s4, or murmurs. Loud clicks of mechanical valve. Abdomen: Soft, non-tender, non-distended, BS + x 4.  Extremities: No clubbing, cyanosis or edema. DP/PT/Radials 2+ and equal bilaterally.  Labs:  No results for input(s): CKTOTAL, CKMB,  TROPONINI in the last 72 hours. Lab Results  Component Value Date   WBC 7.5 09/09/2016   HGB 11.6 (L) 09/09/2016   HCT 35.1 (L) 09/09/2016   MCV 82.8 09/09/2016   PLT 359 09/09/2016    Lab Results  Component Value Date   DDIMER (H) 06/07/2008    0.76        AT THE INHOUSE ESTABLISHED CUTOFF VALUE OF 0.48 ug/mL FEU, THIS ASSAY HAS BEEN DOCUMENTED IN THE LITERATURE TO HAVE A SENSITIVITY AND NEGATIVE PREDICTIVE VALUE OF AT LEAST 98 TO 99%.  THE TEST RESULT SHOULD BE CORRELATED WITH AN ASSESSMENT OF THE CLINICAL PROBABILITY OF DVT / VTE.   Invalid input(s): POCBNP    Component Value Date/Time   NA 136 09/09/2016 0453   K 4.1 09/09/2016 0453   CL 98 (L) 09/09/2016 0453   CO2 31 09/09/2016 0453   GLUCOSE 110 (H) 09/09/2016 0453   BUN 20 09/09/2016 0453   CREATININE 0.94 09/09/2016 0453   CREATININE 0.75 02/23/2015 0001   CALCIUM 9.1 09/09/2016 0453   PROT 6.4 (L) 09/08/2016 0400   ALBUMIN 3.9 09/08/2016 0400   AST 44 (H) 09/08/2016 0400   ALT 39 09/08/2016 0400   ALKPHOS 60 09/08/2016 0400   BILITOT 1.2 09/08/2016 0400  GFRNONAA >60 09/09/2016 0453   GFRAA >60 09/09/2016 0453   Lab Results  Component Value Date   CHOL 154 02/23/2015   HDL 55 02/23/2015   LDLCALC 83 02/23/2015   TRIG 82 02/23/2015   Accessory Clinical Findings  Echocardiogram 2015  - Left ventricle: The cavity size was normal. There was mild focal basal hypertrophy of the septum. Systolic function was normal. The estimated ejection fraction was in the range of 60% to 65%. Wall motion was normal; there were no regional wall motion abnormalities. Features are consistent with a pseudonormal left ventricular filling pattern, with concomitant abnormal relaxation and increased filling pressure (grade 2 diastolic dysfunction).  - Aortic valve: A bioprosthesis was present. Trivial regurgitation. - Aortic root: The aortic root was mildly dilated. Impressions:  - Compared to  06/08/08, LV function remains normal; bioprosthetic aortic valve not well visualized but gradients are normal; if clinically indicated, TEE would have greater sensitivity for SOE.  ECG - performed today 05/21/17 shows sinus bradycardia, normal ECG, HR is slower, personally reviewed.   Assessment & Plan  1. History of bicuspid aortic valve and aortic root dilatation status post AVR and aortic root replacement in 2007 and AVR with mechanical bicuspid valve in 2010 for infection complicated by embolic events. Patient has chronic Coumadin. The last echocardiogram in 2018 showed normal LVEF and normally functioning aortic prosthesis with normal and unchanged gradients.   2. Blood pressure - controlled.  3. Lipids - all at goal in 12/2014 - continue atorvastatin 10 mg po daily. Encourages to continue exercising!  4. Chronic anticoagulation - no bleeding, most recent Hb 11.6.  Follow-up in 1 year.  Ena Dawley, MD, [facility] 05/21/2017, 11:11 AM

## 2017-06-02 ENCOUNTER — Other Ambulatory Visit: Payer: Self-pay | Admitting: Cardiology

## 2017-06-05 DIAGNOSIS — B353 Tinea pedis: Secondary | ICD-10-CM | POA: Diagnosis not present

## 2017-06-05 DIAGNOSIS — L821 Other seborrheic keratosis: Secondary | ICD-10-CM | POA: Diagnosis not present

## 2017-06-05 DIAGNOSIS — D1801 Hemangioma of skin and subcutaneous tissue: Secondary | ICD-10-CM | POA: Diagnosis not present

## 2017-06-05 DIAGNOSIS — D225 Melanocytic nevi of trunk: Secondary | ICD-10-CM | POA: Diagnosis not present

## 2017-06-05 DIAGNOSIS — L570 Actinic keratosis: Secondary | ICD-10-CM | POA: Diagnosis not present

## 2017-08-09 ENCOUNTER — Ambulatory Visit (INDEPENDENT_AMBULATORY_CARE_PROVIDER_SITE_OTHER): Payer: 59 | Admitting: *Deleted

## 2017-08-09 DIAGNOSIS — Z5181 Encounter for therapeutic drug level monitoring: Secondary | ICD-10-CM

## 2017-08-09 DIAGNOSIS — Z7901 Long term (current) use of anticoagulants: Secondary | ICD-10-CM

## 2017-08-09 DIAGNOSIS — Z952 Presence of prosthetic heart valve: Secondary | ICD-10-CM

## 2017-08-09 DIAGNOSIS — Z8673 Personal history of transient ischemic attack (TIA), and cerebral infarction without residual deficits: Secondary | ICD-10-CM

## 2017-08-09 LAB — POCT INR: INR: 2.6

## 2017-08-09 NOTE — Patient Instructions (Signed)
Description   Continue on same dosage 1.5 tablets (7.5mg) daily except 1 tablet (5mg) on Mondays, Wednesdays and Fridays.  Recheck INR in 6 weeks.      

## 2017-09-20 ENCOUNTER — Ambulatory Visit (INDEPENDENT_AMBULATORY_CARE_PROVIDER_SITE_OTHER): Payer: 59 | Admitting: *Deleted

## 2017-09-20 DIAGNOSIS — Z8673 Personal history of transient ischemic attack (TIA), and cerebral infarction without residual deficits: Secondary | ICD-10-CM | POA: Diagnosis not present

## 2017-09-20 DIAGNOSIS — Z5181 Encounter for therapeutic drug level monitoring: Secondary | ICD-10-CM | POA: Diagnosis not present

## 2017-09-20 DIAGNOSIS — Z952 Presence of prosthetic heart valve: Secondary | ICD-10-CM | POA: Diagnosis not present

## 2017-09-20 DIAGNOSIS — Z7901 Long term (current) use of anticoagulants: Secondary | ICD-10-CM

## 2017-09-20 LAB — POCT INR: INR: 2.7 (ref 2.0–3.0)

## 2017-09-20 NOTE — Patient Instructions (Signed)
Description   Continue on same dosage 1.5 tablets (7.5mg ) daily except 1 tablet (5mg ) on Mondays, Wednesdays and Fridays.  Recheck INR in 6 weeks.

## 2017-10-05 ENCOUNTER — Other Ambulatory Visit: Payer: Self-pay | Admitting: Cardiology

## 2017-10-09 ENCOUNTER — Other Ambulatory Visit: Payer: Self-pay | Admitting: Cardiology

## 2017-11-01 ENCOUNTER — Ambulatory Visit (INDEPENDENT_AMBULATORY_CARE_PROVIDER_SITE_OTHER): Payer: 59 | Admitting: *Deleted

## 2017-11-01 ENCOUNTER — Encounter (INDEPENDENT_AMBULATORY_CARE_PROVIDER_SITE_OTHER): Payer: Self-pay

## 2017-11-01 DIAGNOSIS — Z952 Presence of prosthetic heart valve: Secondary | ICD-10-CM

## 2017-11-01 DIAGNOSIS — Z7901 Long term (current) use of anticoagulants: Secondary | ICD-10-CM

## 2017-11-01 DIAGNOSIS — Z8673 Personal history of transient ischemic attack (TIA), and cerebral infarction without residual deficits: Secondary | ICD-10-CM | POA: Diagnosis not present

## 2017-11-01 LAB — POCT INR: INR: 2 (ref 2.0–3.0)

## 2017-11-01 NOTE — Patient Instructions (Addendum)
Description   Today take 2 tablets, then Continue on same dosage 1.5 tablets (7.5mg ) daily except 1 tablet (5mg ) on Mondays, Wednesdays and Fridays.  Recheck INR in 3 weeks.

## 2017-11-22 ENCOUNTER — Ambulatory Visit (INDEPENDENT_AMBULATORY_CARE_PROVIDER_SITE_OTHER): Payer: 59 | Admitting: Pharmacist

## 2017-11-22 DIAGNOSIS — Z8673 Personal history of transient ischemic attack (TIA), and cerebral infarction without residual deficits: Secondary | ICD-10-CM

## 2017-11-22 DIAGNOSIS — Z7901 Long term (current) use of anticoagulants: Secondary | ICD-10-CM | POA: Diagnosis not present

## 2017-11-22 DIAGNOSIS — Z952 Presence of prosthetic heart valve: Secondary | ICD-10-CM

## 2017-11-22 LAB — POCT INR: INR: 3.3 — AB (ref 2.0–3.0)

## 2017-12-20 ENCOUNTER — Ambulatory Visit (INDEPENDENT_AMBULATORY_CARE_PROVIDER_SITE_OTHER): Payer: 59 | Admitting: Pharmacist Clinician (PhC)/ Clinical Pharmacy Specialist

## 2017-12-20 DIAGNOSIS — Z8673 Personal history of transient ischemic attack (TIA), and cerebral infarction without residual deficits: Secondary | ICD-10-CM

## 2017-12-20 DIAGNOSIS — Z952 Presence of prosthetic heart valve: Secondary | ICD-10-CM

## 2017-12-20 DIAGNOSIS — Z7901 Long term (current) use of anticoagulants: Secondary | ICD-10-CM | POA: Diagnosis not present

## 2017-12-20 LAB — POCT INR: INR: 2.9 (ref 2.0–3.0)

## 2017-12-26 ENCOUNTER — Ambulatory Visit (INDEPENDENT_AMBULATORY_CARE_PROVIDER_SITE_OTHER): Payer: 59 | Admitting: Psychiatry

## 2017-12-26 DIAGNOSIS — F4321 Adjustment disorder with depressed mood: Secondary | ICD-10-CM
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
## 2017-12-28 ENCOUNTER — Other Ambulatory Visit: Payer: Self-pay | Admitting: Cardiology

## 2018-01-02 ENCOUNTER — Ambulatory Visit (INDEPENDENT_AMBULATORY_CARE_PROVIDER_SITE_OTHER): Payer: 59 | Admitting: Psychiatry

## 2018-01-02 DIAGNOSIS — F4321 Adjustment disorder with depressed mood: Secondary | ICD-10-CM
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
## 2018-01-07 ENCOUNTER — Encounter: Payer: Self-pay | Admitting: Internal Medicine

## 2018-01-07 ENCOUNTER — Ambulatory Visit (INDEPENDENT_AMBULATORY_CARE_PROVIDER_SITE_OTHER): Payer: 59 | Admitting: Internal Medicine

## 2018-01-07 ENCOUNTER — Telehealth: Payer: Self-pay

## 2018-01-07 VITALS — BP 104/52 | HR 68 | Ht 71.0 in | Wt 181.4 lb

## 2018-01-07 DIAGNOSIS — Z86010 Personal history of colonic polyps: Secondary | ICD-10-CM | POA: Diagnosis not present

## 2018-01-07 DIAGNOSIS — Z7901 Long term (current) use of anticoagulants: Secondary | ICD-10-CM | POA: Diagnosis not present

## 2018-01-07 DIAGNOSIS — Z952 Presence of prosthetic heart valve: Secondary | ICD-10-CM

## 2018-01-07 NOTE — Progress Notes (Signed)
Robert [name] 57 y.o. [birth year] [id number]  Assessment & Plan:   Encounter Diagnoses  Name Primary?  Marland Kitchen Hx of adenomatous colonic polyps Yes  . Chronic anticoagulation   . S/P AVR (aortic valve replacement)     He should hold his warfarin 3 to 5 days prior, I would actually be comfortable with a 3-day hold, Lovenox bridge is up to Dr. Meda [name].  As stated below he would prefer not to do that.  I will have to defer to her judgment though I do know that in at least some expert guidelines aortic valve is thought to be low risk for stroke and just holding warfarin is reasonable.  Perhaps his prior history of stroke is factoring in.  He would also like to try his colonoscopy without sedation and I told him we could do that but should be prepared to administer it if he wants it during the procedure.  The risks and benefits as well as alternatives of endoscopic procedure(s) have been discussed and reviewed. All questions answered. The patient agrees to proceed. We have also specifically reviewed the extra but rare risk of stroke off of his anticoagulation.  I appreciate the opportunity to care for this patient. CC: Robert Lung, MD  I plan to message Dr. Meda [name] as well Subjective:   Chief Complaint: History of adenomatous colon polyps  HPI The patient is here, he has a history of 4 small adenomatous polyps in February 2016.  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  He had a stroke with that.  He has recovered significantly.  He takes warfarin for his aortic valve and when I did his colonoscopy in 2016 Dr. Meda [name] used a Lovenox bridge.  His mother had amyloid angiopathy and died of a cerebral hemorrhage and he has some fear of having a bleed and dying like she did and would prefer not to use Lovenox and even wonders about using warfarin with his aortic valve.  He is prepared to have a colonoscopy again and  is not having any active GI symptoms. Allergies  Allergen Reactions  . Tape Rash and Other (See Comments)    Other reaction(s): Other (See Comments) Makes skin raw Inflammation and TEARS OFF THE SKIN!!  . Tapentadol Other (See Comments) and Rash    Other reaction(s): Other (See Comments) Makes skin raw Inflammation and TEARS OFF THE SKIN!! Inflammation and TEARS OFF THE SKIN!!   Current Meds  Medication Sig  . atorvastatin (LIPITOR) 10 MG tablet TAKE 1 TABLET (10 MG TOTAL) BY MOUTH DAILY.INS ALLOWS 30  . warfarin (COUMADIN) 5 MG tablet TAKE 1 TO 1 AND 1/2 TABLETS DAILY AS DIRECTED BY COUMADIN CLINIC   Past Medical History:  Diagnosis Date  . Aneurysm of ascending aorta (HCC)   . Blood transfusion without reported diagnosis   . Cataract    BEGINNING  . CVA (cerebral infarction)   . DVT (deep venous thrombosis) ([place])   . Hx of adenomatous colonic polyps 05/11/2014  . Hyperlipidemia   . Migraine headache   . Renal stone   . S/P aortic valve replacement   . Stroke ([place])   . Subacute bacterial endocarditis (SBE)    Past Surgical History:  Procedure Laterality Date  . ABDOMINAL AORTIC ANEURYSM REPAIR    . AORTIC VALVE REPLACEMENT  2007  . CARDIAC CATHETERIZATION  01/2005  . 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 COLONOSCOPY    . I&D EXTREMITY Right 06/23/2016   Procedure: IRRIGATION AND DEBRIDEMENT FOOT;  Surgeon: Robert Cedars, MD;  Location: [facility];  Service: Orthopedics;  Laterality: Right;  . TENDON REPAIR Right 06/23/2016   Procedure: TENDON REPAIR;  Surgeon: Robert Cedars, MD;  Location: [facility];  Service: Orthopedics;  Laterality: Right;   Social History   Social History Narrative   Patient lives at home with his wife Robert [name].    Patient has 8 children. Boys   Patient is an Art gallery manager.         family history includes Bladder Cancer in his maternal grandfather; CVA (age of onset: 40) in his mother; Colon cancer in his  paternal grandfather and paternal grandmother.   Review of Systems As above  Objective:   Physical Exam BP (!) 104/52   Pulse 68   Ht 5\' 11"  (1.803 m)   Wt 181 lb 6.4 oz (82.3 kg)   BMI 25.30 kg/m  Eyes anicteric Lungs clr ant Cor metallic s2

## 2018-01-07 NOTE — Patient Instructions (Signed)
  It has been recommended to you by your physician that you have a(n) Colonoscopy completed. Per your request, we did not schedule the procedure(s) today. Please contact our office at [phone] should you decide to have the procedure completed.    Call us back around the end of October for more days to open up.    I appreciate the opportunity to care for you. Silvano Rusk, MD, [facility]

## 2018-01-07 NOTE — Telephone Encounter (Signed)
[facility] Pre-operative Risk Assessment     Request for surgical clearance:     Endoscopy Procedure  What type of surgery is being performed?     colonoscopy  When is this surgery scheduled?     Hopefully in December  What type of clearance is required ?   Pharmacy  Are there any medications that need to be held prior to surgery and how long? Warfarin with lovenox bridging if necessary   Practice name and name of physician performing surgery?      [facility]  What is your office phone and fax number?      Phone- [phone]  [phone]  Anesthesia type (None, local, MAC, general) ?       MAC, patient wants to try it unsedated

## 2018-01-08 NOTE — Telephone Encounter (Signed)
-----   Message from Gatha Mayer, MD sent at 01/08/2018 10:37 AM EDT ----- Regarding: FW: ? Robert [name] bridge PJ see below  I think he should just hold warfarin 3 days   Let him know  He will be happy ----- Message ----- From: Dorothy Spark, MD Sent: 01/07/2018  11:38 AM EDT To: Gatha Mayer, MD Subject: RE: ? Robert [name] bridge                          No bridging necessary, just hold warfarin  ----- Message ----- From: Gatha Mayer, MD Sent: 01/07/2018  11:11 AM EDT To: Dorothy Spark, MD Subject: ? Robert [name] bridge                              Robert [name],  Robert [name] will be having a colonoscopy.  Last time you wanted him bridged with Lovenox.  My understanding is that AVR is low risk for stroke (though I know he has had one w/ bovine valve endocarditis) - and that simply holding warfarin is acceptable.  I understand that he may be a special case and am fine with the bridge but he was asking about not using a bridge - he is fearful of a hemorrhage in the brain - his mom died from that in setting of amyloid angiopathy.  He will do whatever we recommend but I told him I would query you.  Note I am fine with holding warfarin for 3 days instead of 5 if that makes a difference.  Thanks  Robert [name]

## 2018-01-08 NOTE — Telephone Encounter (Signed)
Tried to reach patient and his voicemail is full. Will try again later.

## 2018-01-09 NOTE — Telephone Encounter (Signed)
I tried to reach patient again and mail box still full and unable to take messages. I will try again later.

## 2018-01-10 NOTE — Telephone Encounter (Signed)
Tried to leave message and voicemail full and not taking messages. Will try again next week.

## 2018-01-16 NOTE — Telephone Encounter (Signed)
I have spoken to patient to advise that per Dr [name], he may hold warfarin 3 days prior to our procedure without lovenox bridging. Patient verbalizes clear understanding of this.

## 2018-01-22 ENCOUNTER — Ambulatory Visit (INDEPENDENT_AMBULATORY_CARE_PROVIDER_SITE_OTHER): Payer: 59 | Admitting: Pharmacist Clinician (PhC)/ Clinical Pharmacy Specialist

## 2018-01-22 DIAGNOSIS — Z952 Presence of prosthetic heart valve: Secondary | ICD-10-CM | POA: Diagnosis not present

## 2018-01-22 DIAGNOSIS — Z7901 Long term (current) use of anticoagulants: Secondary | ICD-10-CM | POA: Diagnosis not present

## 2018-01-22 DIAGNOSIS — Z8673 Personal history of transient ischemic attack (TIA), and cerebral infarction without residual deficits: Secondary | ICD-10-CM

## 2018-01-22 LAB — POCT INR: INR: 2.6 (ref 2.0–3.0)

## 2018-01-22 NOTE — Patient Instructions (Signed)
Description   Continue on same dosage 1.5 tablets (7.5mg ) daily except 1 tablet (5mg ) on Mondays, Wednesdays and Fridays.  Recheck INR in 4 weeks.

## 2018-01-30 ENCOUNTER — Encounter

## 2018-01-30 ENCOUNTER — Ambulatory Visit (INDEPENDENT_AMBULATORY_CARE_PROVIDER_SITE_OTHER): Payer: 59 | Admitting: Psychiatry

## 2018-01-30 ENCOUNTER — Encounter: Payer: Self-pay | Admitting: Family Medicine

## 2018-01-30 ENCOUNTER — Ambulatory Visit (INDEPENDENT_AMBULATORY_CARE_PROVIDER_SITE_OTHER): Payer: 59 | Admitting: Family Medicine

## 2018-01-30 VITALS — BP 110/70 | HR 61 | Temp 97.6°F | Wt 178.0 lb

## 2018-01-30 DIAGNOSIS — Z7901 Long term (current) use of anticoagulants: Secondary | ICD-10-CM

## 2018-01-30 DIAGNOSIS — Z23 Encounter for immunization: Secondary | ICD-10-CM

## 2018-01-30 DIAGNOSIS — F4321 Adjustment disorder with depressed mood: Secondary | ICD-10-CM

## 2018-01-30 DIAGNOSIS — Z952 Presence of prosthetic heart valve: Secondary | ICD-10-CM | POA: Diagnosis not present

## 2018-01-30 DIAGNOSIS — R319 Hematuria, unspecified: Secondary | ICD-10-CM

## 2018-01-30 DIAGNOSIS — Z1389 Encounter for screening for other disorder: Secondary | ICD-10-CM | POA: Diagnosis not present

## 2018-01-30 LAB — POCT URINALYSIS DIP (PROADVANTAGE DEVICE)
BILIRUBIN UA: NEGATIVE mg/dL
Bilirubin, UA: NEGATIVE
Blood, UA: NEGATIVE
GLUCOSE UA: NEGATIVE mg/dL
Leukocytes, UA: NEGATIVE
Nitrite, UA: NEGATIVE
PROTEIN UA: NEGATIVE mg/dL
SPECIFIC GRAVITY, URINE: 1.03
UUROB: 3.5
pH, UA: 5.5 (ref 5.0–8.0)

## 2018-01-30 NOTE — Progress Notes (Signed)
   Subjective:    Patient ID: Robert [name], male    DOB: 1960/11/28, 57 y.o.   MRN: [id number]  HPI He is here for evaluation of hematuria.  He has had 2 episodes of this within the last month.  Both of these occurred after running.  He did have a previous episode several years ago of gross hematuria.  He has had an aortic valve replacement and presently is on Coumadin.  His last PT/INR was 2.6.  He has had no difficulty with easy bruisability, gums bleeding or epistaxis.  Does state that his grandfather apparently died of bladder cancer. Also yesterday apparently he had an episode of slight dizziness and falling to the right.  It lasted less than 5 minutes.  No associated blurred vision, double vision, weakness.  Presently he is having no symptoms of any kind.  He also sustained a significant right foot injury requiring orthopedic as well as skin grafting.  He states that over the last year he does have a lesion over the dorsum of his right foot that enlarges, gets dark and then will drain bloody material.  Review of Systems     Objective:   Physical Exam Alert and in no distress.  Rectal exam shows a normal prostate.  Urinalysis showed no red cells. Exam of the right foot does show a healing laceration with a central 1 and half centimeter round lesion with a central hole.  No drainage noted.      Assessment & Plan:  Hematuria, unspecified type  Screening for blood or protein in urine - Plan: POCT Urinalysis DIP (Proadvantage Device)  Long term current use of anticoagulant therapy  History of aortic valve replacement I explained that it is not unusual to see blood in urine after running but it should not necessarily be gross hematuria.  Also his PT/INR is in a good range and he has no symptoms of bleeding so having urology take a look at him is not unreasonable  I explained that the dizziness was difficult to identify but since he is not having trouble with this time no further  intervention needed. Did encourage him to make an appointment with the plastic surgeon at [facility] that worked on his foot to get their opinion on this recurring foot lesion.

## 2018-01-30 NOTE — Progress Notes (Signed)
      [facility] Counselor/Therapist Progress Note   Patient ID: Robert [name], MRN: [id number]  Date: 01/30/2018  Timespent: 52 minutes   Treatment Type: Individual   Reported Symptoms: irritability   Mental Status Exam:    Appearance:   Casual     Behavior:  Appropriate  Motor:  Normal  Speech/Language:   Clear and Coherent  Affect:  Depressed  Mood:  irritable and sad  Thought process:  normal  Thought content:    WNL  Sensory/Perceptual disturbances:    WNL  Orientation:  oriented to person, place, time/date and situation  Attention:  Good  Concentration:  Good  Memory:  WNL  Fund of knowledge:   Good  Insight:    Good  Judgment:   Good  Impulse Control:  Good     Risk Assessment: Danger to Self:  No Self-injurious Behavior: No Danger to Others: No Duty to Warn:no Physical Aggression / Violence:No  Access to Firearms a concern: No  Gang Involvement:No    Subjective: The client states that he and his wife met yesterday with their marriage counselor [name].  He states he felt invalidated and marginalized at the session.  His wife's main complaint was not being validated and the injustice that is happened in their marriage.  According to the client this mainly refers to the fact that he has a larger amount of money than she does.  He came into the to the marriage with a sizable mass tag and then received an inheritance from his mom.  Her feeling is that she deserves part of that.  He understands that [place] in law does not support that.  Her belief is because he has gotten angry with her children that she deserves part of the money.  The counselor seemed to agree with his wife.  He stated out loud that he was not being heard but that apparently was ignored as well.  When he left that session he said he would not be returning. Today the client is agitated, irritable and disappointed.  He feels his wife does not listen to him and has a number of thinking  errors that causes her to believe something that is not there.  He is quite frustrated.  There are your separation comes up in January.  He is getting very close to being done.   Interventions: Solution-Oriented/Positive Psychology and Insight-Oriented   Diagnosis:   ICD-10-CM   1. Adjustment disorder with depressed mood F43.21      Plan: Boundaries.   [name], [place]

## 2018-02-06 ENCOUNTER — Ambulatory Visit (INDEPENDENT_AMBULATORY_CARE_PROVIDER_SITE_OTHER): Payer: 59 | Admitting: Psychiatry

## 2018-02-06 ENCOUNTER — Encounter: Payer: Self-pay | Admitting: Psychiatry

## 2018-02-06 DIAGNOSIS — F4321 Adjustment disorder with depressed mood: Secondary | ICD-10-CM | POA: Diagnosis not present

## 2018-02-06 NOTE — Progress Notes (Signed)
      [facility] Counselor/Therapist Progress Note   Patient ID: Robert [name], MRN: [id number]  Date: 02/06/2018  Timespent: 50 minutes   Treatment Type: Individual   Reported Symptoms: Stress   Mental Status Exam:    Appearance:   Casual     Behavior:  Appropriate  Motor:  Normal  Speech/Language:   Clear and Coherent  Affect:  Appropriate  Mood:  irritable and sad  Thought process:  normal  Thought content:    WNL  Sensory/Perceptual disturbances:    WNL  Orientation:  oriented to person, place, time/date and situation  Attention:  Good  Concentration:  Good  Memory:  WNL  Fund of knowledge:   Good  Insight:    Good  Judgment:   Good  Impulse Control:  Good     Risk Assessment: Danger to Self:  No Self-injurious Behavior: No Danger to Others: No Duty to Warn:no Physical Aggression / Violence:No  Access to Firearms a concern: No  Gang Involvement:No     Subjective: The client reports that his wife is quit talking to him.  He states that his father's 4th birthday is this weekend.  Since he and his wife have been separated his father has told him that he would prefer if his wife did not come to his birthday party in [place].  The client states he dissented with his father about this.  He told his was wife what his father wanted one but that he disagreed.  His wife told him that she believed that he was lying to her and that he had not come clean with his father about his own behavior.  The client told me today pointedly that he been very transparent with his father and that his wife was mind reading.  He is very disappointed that his wife has taken the tack that she has. The client is continuing to work on assertiveness and setting boundaries with his wife.   Interventions: Assertiveness/Communication, Solution-Oriented/Positive Psychology and Insight-Oriented   Diagnosis:   ICD-10-CM   1. Adjustment disorder with depressed mood F43.21      Plan:  Boundaries, assertiveness.   Albertina Parr Lathen Seal, [place]

## 2018-02-13 ENCOUNTER — Ambulatory Visit: Payer: 59 | Admitting: Psychiatry

## 2018-02-17 ENCOUNTER — Other Ambulatory Visit: Payer: Self-pay

## 2018-02-17 ENCOUNTER — Encounter: Payer: Self-pay | Admitting: Internal Medicine

## 2018-02-17 ENCOUNTER — Ambulatory Visit (AMBULATORY_SURGERY_CENTER): Payer: Self-pay | Admitting: *Deleted

## 2018-02-17 VITALS — Ht 71.0 in | Wt 180.0 lb

## 2018-02-17 DIAGNOSIS — Z86010 Personal history of colonic polyps: Secondary | ICD-10-CM

## 2018-02-17 NOTE — Progress Notes (Signed)
No egg or soy allergy known to patient  No issues with past sedation with any surgeries  or procedures, no intubation problems  No diet pills per patient No home 02 use per patient  Patient takes Coumadin, will stop 3 days prior, PATIENT WILL GO TO aNTICOAGULANT CLINIC ON 11/27 Pt denies issues with constipation  No A fib or A flutter  EMMI video sent to pt's e mail  Patient concerned about not eating nuts and beans over thanksgiving.looked for another appointment, next available is in January of 2020. Patient opted to keep appointment.

## 2018-02-18 ENCOUNTER — Encounter: Payer: Self-pay | Admitting: Psychiatry

## 2018-02-18 ENCOUNTER — Ambulatory Visit (INDEPENDENT_AMBULATORY_CARE_PROVIDER_SITE_OTHER): Payer: 59 | Admitting: Psychiatry

## 2018-02-18 DIAGNOSIS — F4321 Adjustment disorder with depressed mood: Secondary | ICD-10-CM
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
## 2018-02-19 ENCOUNTER — Ambulatory Visit (INDEPENDENT_AMBULATORY_CARE_PROVIDER_SITE_OTHER): Payer: 59 | Admitting: Pharmacist Clinician (PhC)/ Clinical Pharmacy Specialist

## 2018-02-19 DIAGNOSIS — Z8673 Personal history of transient ischemic attack (TIA), and cerebral infarction without residual deficits: Secondary | ICD-10-CM

## 2018-02-19 DIAGNOSIS — Z952 Presence of prosthetic heart valve: Secondary | ICD-10-CM

## 2018-02-19 DIAGNOSIS — Z7901 Long term (current) use of anticoagulants: Secondary | ICD-10-CM | POA: Diagnosis not present

## 2018-02-19 LAB — POCT INR: INR: 2.1 (ref 2.0–3.0)

## 2018-02-19 MED ORDER — ENOXAPARIN SODIUM 80 MG/0.8ML ~~LOC~~ SOLN: 80.0000 mg | Freq: Two times a day (BID) | SUBCUTANEOUS | 0 refills | Status: DC

## 2018-02-19 NOTE — Patient Instructions (Addendum)
No warfarin until day of colonoscopy  Nov 27: Inject Lovenox 80 mg in the fatty abdominal tissue at least 2 inches from the belly button twice a day about 12 hours apart, 8am and 8pm rotate sites. No Coumadin.  Nov 28: Inject Lovenox in the fatty tissue every 12 hours, 8am and 8pm. No Coumadin.  Nov 29: Inject Lovenox in the fatty tissue every 12 hours, 8am and 8pm. No Coumadin.  Nov 30: Inject Lovenox in the fatty tissue every 12 hours, 8am and 8pm. No Coumadin.  Dec 1: Inject Lovenox in the fatty tissue in the morning at 8 am (No PM dose). No Coumadin.  Dec 2: Procedure Day - No Lovenox - Resume Coumadin in the evening or as directed by doctor . Coumadin 7.5 mg (1.5 tablets)  Dec 3: Resume Lovenox inject in the fatty tissue every 12 hours and take Coumadin 10 mg (2 tablets)  Dec 4: Inject Lovenox in the fatty tissue every 12 hours and take Coumadin 10 mg (2 tablets)  Dec 5: Inject Lovenox in the fatty tissue every 12 hours and take Coumadin 7.5 mg (1.5 tablets)  Dec 6: Inject Lovenox in the fatty tissue in the morning - INR check at 8:30

## 2018-02-24 ENCOUNTER — Encounter: Payer: Self-pay | Admitting: Internal Medicine

## 2018-02-24 ENCOUNTER — Ambulatory Visit (AMBULATORY_SURGERY_CENTER): Payer: 59 | Admitting: Internal Medicine

## 2018-02-24 VITALS — BP 133/79 | HR 61 | Resp 14 | Ht 71.0 in | Wt 181.0 lb

## 2018-02-24 DIAGNOSIS — D124 Benign neoplasm of descending colon: Secondary | ICD-10-CM | POA: Diagnosis not present

## 2018-02-24 DIAGNOSIS — Z86010 Personal history of colonic polyps: Secondary | ICD-10-CM | POA: Diagnosis not present

## 2018-02-24 DIAGNOSIS — D123 Benign neoplasm of transverse colon: Secondary | ICD-10-CM

## 2018-02-24 DIAGNOSIS — Z8673 Personal history of transient ischemic attack (TIA), and cerebral infarction without residual deficits: Secondary | ICD-10-CM | POA: Diagnosis not present

## 2018-02-24 MED ORDER — SODIUM CHLORIDE 0.9 % IV SOLN: 500.0000 mL | Freq: Once | INTRAVENOUS | Status: DC

## 2018-02-24 NOTE — Progress Notes (Signed)
Called to room to assist during endoscopic procedure.  Patient ID and intended procedure confirmed with present staff. Received instructions for my participation in the procedure from the performing physician.  

## 2018-02-24 NOTE — Progress Notes (Signed)
To recovery, report to RN, VSS. 

## 2018-02-24 NOTE — Patient Instructions (Addendum)
I found and removed 3 tiny polyps as you know.  I will let you know pathology results and when to have another routine colonoscopy by mail and/or My Chart.  Please resume Coumadin and Lovenox as instructed - I copied the anti-coagulation clinic recommendations below.  Dec 2: Procedure Day - No Lovenox - Resume Coumadin in the evening or as directed by doctor . Coumadin 7.5 mg (1.5 tablets)  Dec 3: Resume Lovenox inject in the fatty tissue every 12 hours and take Coumadin 10 mg (2 tablets)  Dec 4: Inject Lovenox in the fatty tissue every 12 hours and take Coumadin 10 mg (2 tablets)  Dec 5: Inject Lovenox in the fatty tissue every 12 hours and take Coumadin 7.5 mg (1.5 tablets)  Dec 6: Inject Lovenox in the fatty tissue in the morning - INR check at 8:30   I appreciate the opportunity to care for you. Gatha Mayer, MD, FACG YOU HAD AN ENDOSCOPIC PROCEDURE TODAY AT [facility]:   Refer to the procedure report that was given to you for any specific questions about what was found during the examination.  If the procedure report does not answer your questions, please call your gastroenterologist to clarify.  If you requested that your care partner not be given the details of your procedure findings, then the procedure report has been included in a sealed envelope for you to review at your convenience later.  YOU SHOULD EXPECT: Some feelings of bloating in the abdomen. Passage of more gas than usual.  Walking can help get rid of the air that was put into your GI tract during the procedure and reduce the bloating. If you had a lower endoscopy (such as a colonoscopy or flexible sigmoidoscopy) you may notice spotting of blood in your stool or on the toilet paper. If you underwent a bowel prep for your procedure, you may not have a normal bowel movement for a few days.  Please Note:  You might notice some irritation and congestion in your nose or some drainage.  This is  from the oxygen used during your procedure.  There is no need for concern and it should clear up in a day or so.  SYMPTOMS TO REPORT IMMEDIATELY:   Following lower endoscopy (colonoscopy or flexible sigmoidoscopy):  Excessive amounts of blood in the stool  Significant tenderness or worsening of abdominal pains  Swelling of the abdomen that is new, acute  Fever of 100F or higher  For urgent or emergent issues, a gastroenterologist can be reached at any hour by calling [phone].   DIET:  We do recommend a small meal at first, but then you may proceed to your regular diet.  Drink plenty of fluids but you should avoid alcoholic beverages for 24 hours.  MEDICATIONS: Continue present medications. Resume Coumadin (Warfarin) today and Lovenox (Enoxaparin) tomorrow at prior doses. Refer to Coumadin clinic for further adjustment of therapy.  Please see handouts given to you by your recovery nurse.  ACTIVITY:  You should plan to take it easy for the rest of today and you should NOT DRIVE or use heavy machinery until tomorrow (because of the sedation medicines used during the test).    FOLLOW UP: Our staff will call the number listed on your records the next business day following your procedure to check on you and address any questions or concerns that you may have regarding the information given to you following your procedure. If we do not reach  you, we will leave a message.  However, if you are feeling well and you are not experiencing any problems, there is no need to return our call.  We will assume that you have returned to your regular daily activities without incident.  If any biopsies were taken you will be contacted by phone or by letter within the next 1-3 weeks.  Please call us at [phone] if you have not heard about the biopsies in 3 weeks.   Thank you for allowing [place] to provide for your healthcare needs today.   SIGNATURES/CONFIDENTIALITY: You and/or your care partner  have signed paperwork which will be entered into your electronic medical record.  These signatures attest to the fact that that the information above on your After Visit Summary has been reviewed and is understood.  Full responsibility of the confidentiality of this discharge information lies with you and/or your care-partner.

## 2018-02-24 NOTE — Op Note (Addendum)
[place] Patient Name: Robert [name] Procedure Date: 02/24/2018 11:57 AM MRN: [id number] Endoscopist: Gatha Mayer , MD Age: 57 Referring MD:  Date of Birth: [birth year] Gender: Male Account #: [id number] Procedure:                Colonoscopy Indications:              Surveillance: Personal history of adenomatous                            polyps on last colonoscopy 3 years ago Medicines:                None Procedure:                Pre-Anesthesia Assessment:                           - Prior to the procedure, a History and Physical                            was performed, and patient medications and                            allergies were reviewed. The patient's tolerance of                            previous anesthesia was also reviewed. The risks                            and benefits of the procedure and the sedation                            options and risks were discussed with the patient.                            All questions were answered, and informed consent                            was obtained. Prior Anticoagulants: The patient                            last took Coumadin (warfarin) 5 days and Lovenox                            (enoxaparin) 1 day prior to the procedure. After                            reviewing the risks and benefits, the patient was                            deemed in satisfactory condition to undergo the                            procedure.  After obtaining informed consent, the colonoscope                            was passed under direct vision. Throughout the                            procedure, the patient's blood pressure, pulse, and                            oxygen saturations were monitored continuously. The                            Colonoscope was introduced through the anus and                            advanced to the the cecum, identified by                            appendiceal  orifice and ileocecal valve. The                            colonoscopy was performed without difficulty. The                            patient tolerated the procedure well. The quality                            of the bowel preparation was good. The bowel                            preparation used was Miralax. The ileocecal valve,                            appendiceal orifice, and rectum were photographed. Scope In: 12:02:11 PM Scope Out: 12:21:04 PM Scope Withdrawal Time: 0 hours 15 minutes 19 seconds  Total Procedure Duration: 0 hours 18 minutes 53 seconds  Findings:                 The perianal and digital rectal examinations were                            normal. Pertinent negatives include normal prostate                            (size, shape, and consistency).                           A 4 mm polyp was found in the descending colon. The                            polyp was sessile. The polyp was removed with a                            cold snare. Resection and retrieval were  complete.                            Verification of patient identification for the                            specimen was done. Estimated blood loss was minimal.                           Two sessile polyps were found in the transverse                            colon. The polyps were 1 to 2 mm in size. These                            polyps were removed with a cold biopsy forceps.                            Resection and retrieval were complete. Verification                            of patient identification for the specimen was                            done. Estimated blood loss was minimal.                           The exam was otherwise without abnormality.                           Rectal retroflexion not tolerated Complications:            No immediate complications. Estimated Blood Loss:     Estimated blood loss was minimal. Impression:               - One 4 mm polyp in the descending  colon, removed                            with a cold snare. Resected and retrieved.                           - Two 1 to 2 mm polyps in the transverse colon,                            removed with a cold biopsy forceps. Resected and                            retrieved.                           - The examination was otherwise normal on direct                            and retroflexion views.                           -  Personal history of colonic polyps. 4 adenomas                            2016                           Unsedated exam Recommendation:           - Patient has a contact number available for                            emergencies. The signs and symptoms of potential                            delayed complications were discussed with the                            patient. Return to normal activities tomorrow.                            Written discharge instructions were provided to the                            patient.                           - Resume previous diet.                           - Resume Coumadin (warfarin) today and Lovenox                            (enoxaparin) tomorrow at prior doses. Refer to                            [facility] for further adjustment of therapy.                           - Repeat colonoscopy is recommended for                            surveillance. The colonoscopy date will be                            determined after pathology results from today's                            exam become available for review. Gatha Mayer, MD 02/24/2018 12:30:44 PM This report has been signed electronically.

## 2018-02-25 ENCOUNTER — Telehealth: Payer: Self-pay | Admitting: *Deleted

## 2018-02-25 NOTE — Telephone Encounter (Signed)
  Follow up Call-  Call back number 02/24/2018  Post procedure Call Back phone  # [phone]  Permission to leave phone message Yes  Some recent data might be hidden     Patient questions:  Do you have a fever, pain , or abdominal swelling? No. Pain Score  0 *  Have you tolerated food without any problems? Yes.    Have you been able to return to your normal activities? Yes.    Do you have any questions about your discharge instructions: Diet   No. Medications  No. Follow up visit  No.  Do you have questions or concerns about your Care? No.  Actions: * If pain score is 4 or above: No action needed, pain <4.

## 2018-02-28 ENCOUNTER — Ambulatory Visit (INDEPENDENT_AMBULATORY_CARE_PROVIDER_SITE_OTHER): Payer: 59 | Admitting: Pharmacist Clinician (PhC)/ Clinical Pharmacy Specialist

## 2018-02-28 ENCOUNTER — Encounter: Payer: Self-pay | Admitting: Internal Medicine

## 2018-02-28 DIAGNOSIS — Z952 Presence of prosthetic heart valve: Secondary | ICD-10-CM | POA: Diagnosis not present

## 2018-02-28 DIAGNOSIS — Z8673 Personal history of transient ischemic attack (TIA), and cerebral infarction without residual deficits: Secondary | ICD-10-CM | POA: Diagnosis not present

## 2018-02-28 DIAGNOSIS — Z7901 Long term (current) use of anticoagulants: Secondary | ICD-10-CM | POA: Diagnosis not present

## 2018-02-28 DIAGNOSIS — Z86010 Personal history of colonic polyps: Secondary | ICD-10-CM

## 2018-02-28 LAB — POCT INR: INR: 1.9 — AB (ref 2.0–3.0)

## 2018-02-28 NOTE — Progress Notes (Signed)
3 tiny adenomas Repeat colonoscopy 2023 (4 years - he has to stop warfarin and go on Lovenox so 4 instead of 3 year recall)

## 2018-03-07 ENCOUNTER — Other Ambulatory Visit: Payer: Self-pay | Admitting: Cardiology

## 2018-03-14 ENCOUNTER — Ambulatory Visit (INDEPENDENT_AMBULATORY_CARE_PROVIDER_SITE_OTHER): Payer: 59 | Admitting: Pharmacist

## 2018-03-14 DIAGNOSIS — Z8673 Personal history of transient ischemic attack (TIA), and cerebral infarction without residual deficits: Secondary | ICD-10-CM | POA: Diagnosis not present

## 2018-03-14 DIAGNOSIS — Z952 Presence of prosthetic heart valve: Secondary | ICD-10-CM

## 2018-03-14 DIAGNOSIS — Z7901 Long term (current) use of anticoagulants: Secondary | ICD-10-CM

## 2018-03-14 LAB — POCT INR: INR: 2.3 (ref 2.0–3.0)

## 2018-04-02 ENCOUNTER — Encounter

## 2018-04-02 ENCOUNTER — Encounter: Payer: Self-pay | Admitting: Psychiatry

## 2018-04-02 ENCOUNTER — Ambulatory Visit (INDEPENDENT_AMBULATORY_CARE_PROVIDER_SITE_OTHER): Payer: 59 | Admitting: Psychiatry

## 2018-04-02 DIAGNOSIS — F4321 Adjustment disorder with depressed mood: Secondary | ICD-10-CM

## 2018-04-02 NOTE — Progress Notes (Signed)
      [facility] Counselor/Therapist Progress Note  Patient ID: Robert [name], MRN: [id number],    Date: 04/02/2018  Time Spent: 50 minutes   Treatment Type: Individual Therapy  Reported Symptoms: Irritability  Mental Status Exam:  Appearance:   Casual and Well Groomed     Behavior:  Appropriate  Motor:  Normal  Speech/Language:   Clear and Coherent  Affect:  Appropriate  Mood:  irritable and sad  Thought process:  normal  Thought content:    WNL  Sensory/Perceptual disturbances:    WNL  Orientation:  oriented to person, place, time/date and situation  Attention:  Good  Concentration:  Good  Memory:  WNL  Fund of knowledge:   Good  Insight:    Good  Judgment:   Good  Impulse Control:  Good   Risk Assessment: Danger to Self:  No Self-injurious Behavior: No Danger to Others: No Duty to Warn:no Physical Aggression / Violence:No  Access to Firearms a concern: No  Gang Involvement:No   Subjective: The client recently wrote a letter to his wife setting boundaries.  He told him that since they were separated that he had to limit her access to his HSA card.  All that money comes out of his salary.  None of the money in the [facility] has been unlikely that she has contributed.  She became very angry with this and stated that he was "controlling."  The client states setting boundaries is not being controlling.  "I am just telling you what you can do with my money ".  The client is gaining the realization that his wife does not think logically about things.  As his father told him years ago, "2+2 does not equal four with that girl."  The client went on to discuss issues with his work.  His reimbursement at work is not consistent with the level of work that he is doing.  There has been a lot of reorganization at his company.  The client is trying to navigate things with human resources.  He does not feel like he is getting much traction.  He is very discouraged with this.  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  Interventions: Assertiveness/Communication, Solution-Oriented/Positive Psychology and Insight-Oriented  Diagnosis:   ICD-10-CM   1. Adjustment disorder with depressed mood F43.21     Plan: Boundaries, assertive communication, letter to wife about their house.  Robert [name], [place]

## 2018-04-09 ENCOUNTER — Ambulatory Visit (INDEPENDENT_AMBULATORY_CARE_PROVIDER_SITE_OTHER): Payer: 59 | Admitting: Psychiatry

## 2018-04-09 DIAGNOSIS — F4321 Adjustment disorder with depressed mood: Secondary | ICD-10-CM

## 2018-04-10 ENCOUNTER — Encounter: Payer: Self-pay | Admitting: Psychiatry

## 2018-04-10 NOTE — Progress Notes (Signed)
      [facility] Counselor/Therapist Progress Note  Patient ID: WESSON STITH, MRN: [id number],    Date: 04/09/2018  Time Spent: 48 minutes   Treatment Type: Individual Therapy  Reported Symptoms: Depressed mood and Irritability  Mental Status Exam:  Appearance:   Casual and Well Groomed     Behavior:  Appropriate  Motor:  Normal  Speech/Language:   Clear and Coherent  Affect:  Appropriate  Mood:  irritable and sad  Thought process:  normal  Thought content:    WNL  Sensory/Perceptual disturbances:    WNL  Orientation:  oriented to person, place, time/date and situation  Attention:  Good  Concentration:  Good  Memory:  WNL  Fund of knowledge:   Good  Insight:    Good  Judgment:   Good  Impulse Control:  Good   Risk Assessment: Danger to Self:  No Self-injurious Behavior: No Danger to Others: No Duty to Warn:no Physical Aggression / Violence:No  Access to Firearms a concern: No  Gang Involvement:No   Subjective: The client states that his marriage is at the, "end of the plank."  His wife continues to insist that he is trying to dominate and control her.  She tells him things like, "you like winning don't you?"  This puzzles him. Today we focused on the clients relationship as well as issues at his job.  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  He is trying to find out if his company is all in or if they want to reassign his patents back to him so that he can pursue this on his own his own.  He has a lot of concern about this as well as frustration.  The client will meet with the chief technology officer to today who is a new person.  He hopes to be able to have a reasonable discussion with his him and clarify his position. The client is frustrated also that he cannot seem to change his wife's narrative about him.  He kept asking me how he could change this.  I explained to the client he has been trying to  communicate correctly to to his wife this whole past year to no avail.  He will have to accept things as they are.  Interventions: Assertiveness/Communication, Solution-Oriented/Positive Psychology and Insight-Oriented  Diagnosis:   ICD-10-CM   1. Adjustment disorder with depressed mood F43.21     Plan: Assertiveness, boundaries, positive self talk, self-care, radical acceptance.  Albertina Parr Renu [place], [place]

## 2018-04-15 DIAGNOSIS — M60271 Foreign body granuloma of soft tissue, not elsewhere classified, right ankle and foot: Secondary | ICD-10-CM | POA: Diagnosis not present

## 2018-04-15 DIAGNOSIS — L980 Pyogenic granuloma: Secondary | ICD-10-CM | POA: Diagnosis not present

## 2018-04-15 DIAGNOSIS — S91301D Unspecified open wound, right foot, subsequent encounter: Secondary | ICD-10-CM | POA: Diagnosis not present

## 2018-04-16 ENCOUNTER — Ambulatory Visit (INDEPENDENT_AMBULATORY_CARE_PROVIDER_SITE_OTHER): Payer: 59 | Admitting: Psychiatry

## 2018-04-16 ENCOUNTER — Encounter: Payer: Self-pay | Admitting: Psychiatry

## 2018-04-16 DIAGNOSIS — F4321 Adjustment disorder with depressed mood: Secondary | ICD-10-CM

## 2018-04-16 NOTE — Progress Notes (Signed)
      [facility] Counselor/Therapist Progress Note  Patient ID: Robert [name], MRN: [id number],    Date: 04/16/2018  Time Spent: 50 minutes   Treatment Type: Individual Therapy  Reported Symptoms: Depressed mood  Mental Status Exam:  Appearance:   Casual and Well Groomed     Behavior:  Appropriate  Motor:  Normal  Speech/Language:   Clear and Coherent  Affect:  Appropriate  Mood:  irritable and sad  Thought process:  normal  Thought content:    WNL  Sensory/Perceptual disturbances:    WNL  Orientation:  oriented to person, place, time/date and situation  Attention:  Good  Concentration:  Good  Memory:  WNL  Fund of knowledge:   Good  Insight:    Good  Judgment:   Good  Impulse Control:  Good   Risk Assessment: Danger to Self:  No Self-injurious Behavior: No Danger to Others: No Duty to Warn:no Physical Aggression / Violence:No  Access to Firearms a concern: No  Gang Involvement:No   Subjective: The client reports that he was unable to talk to the new supervisor.  He did send the client an email stating something to affect "[name]."  The client was upset by this response and did not feel like he was being heard.  He would like to talk to the supervisor face-to-face to discuss withdrawing from the company but have been available his 3 patents on battery technology signed over to him.  Other people higher up in the company have signed off on it and he wanted this supervisor to be on board as well.  The client is very frustrated at being a stone while doing so many areas.  He Robert [name] decide to act on his own and implement the signed contract that he does have.  He will try one more time to send an email to see if he can get an audience.  [name]  Interventions: Assertiveness/Communication, Solution-Oriented/Positive Psychology,  Play-based therapies and Insight-Oriented  Diagnosis:   ICD-10-CM   1. Adjustment disorder with depressed mood F43.21     Plan: Assertiveness, boundaries, e-mail to supervisor.  Robert [name], [place]

## 2018-04-23 ENCOUNTER — Ambulatory Visit (INDEPENDENT_AMBULATORY_CARE_PROVIDER_SITE_OTHER): Payer: 59 | Admitting: Psychiatry

## 2018-04-23 ENCOUNTER — Encounter: Payer: Self-pay | Admitting: Psychiatry

## 2018-04-23 DIAGNOSIS — F4321 Adjustment disorder with depressed mood: Secondary | ICD-10-CM

## 2018-04-23 NOTE — Progress Notes (Signed)
      [facility] Counselor/Therapist Progress Note  Patient ID: Robert [name], MRN: [id number],    Date: 04/23/2018  Time Spent: 45 minutes   Treatment Type: Individual Therapy  Reported Symptoms: Irritability  Mental Status Exam:  Appearance:   Casual and Well Groomed     Behavior:  Appropriate  Motor:  Normal  Speech/Language:   Clear and Coherent  Affect:  Appropriate  Mood:  irritable  Thought process:  normal  Thought content:    WNL  Sensory/Perceptual disturbances:    WNL  Orientation:  oriented to person, place, time/date and situation  Attention:  Good  Concentration:  Good  Memory:  WNL  Fund of knowledge:   Good  Insight:    Good  Judgment:   Good  Impulse Control:  Good   Risk Assessment: Danger to Self:  No Self-injurious Behavior: No Danger to Others: No Duty to Warn:no Physical Aggression / Violence:No  Access to Firearms a concern: No  Gang Involvement:No   Subjective: The client states that he talk to 1 of the other managers that he is friends with at his company.  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  It would be a conflict of interest.  This made sense to the client.  He still wants to have a conversation about what he can do for the company trying to angle for an increase in his salary which he feels is way overdue. He feels the ongoing issues with his wife are similar to those at work.  He states he takes the long view versus a shorter view.  When the client talks to his wife about things they could do together to parent the children she becomes defensive and suspicious that he is up to know good.  The client is fairly transparent and is always possible by her suspiciousness.  The negative narrative she continues to harbor he believes is going to result in their permanent divorce. The client  continues to work on his communication skills with his wife.  He has asked her to read through the boundaries book so that I can discuss it together in terms of their relationship.  He also realizes that he needs to be much more assertive and clear about what he needs or wants both at work and with his wife.  Interventions: Assertiveness/Communication, Solution-Oriented/Positive Psychology and Insight-Oriented  Diagnosis:   ICD-10-CM   1. Adjustment disorder with depressed mood F43.21     Plan: Assertiveness, boundaries, communication.  Robert [name], [place]

## 2018-04-27 DIAGNOSIS — J101 Influenza due to other identified influenza virus with other respiratory manifestations: Secondary | ICD-10-CM | POA: Diagnosis not present

## 2018-04-30 ENCOUNTER — Ambulatory Visit (INDEPENDENT_AMBULATORY_CARE_PROVIDER_SITE_OTHER): Payer: 59 | Admitting: Psychiatry

## 2018-04-30 ENCOUNTER — Encounter: Payer: Self-pay | Admitting: Psychiatry

## 2018-04-30 DIAGNOSIS — F4321 Adjustment disorder with depressed mood: Secondary | ICD-10-CM | POA: Diagnosis not present

## 2018-04-30 NOTE — Progress Notes (Signed)
      [facility] Counselor/Therapist Progress Note  Patient ID: CAIUS SILBERNAGEL, MRN: [id number],    Date: 04/30/2018  Time Spent: 52 minutes   Treatment Type: Individual Therapy  Reported Symptoms: Depressed mood and Anxious Mood  Mental Status Exam:  Appearance:   Casual     Behavior:  Appropriate  Motor:  Normal  Speech/Language:   Clear and Coherent  Affect:  Appropriate  Mood:  anxious and sad  Thought process:  normal  Thought content:    WNL  Sensory/Perceptual disturbances:    WNL  Orientation:  oriented to person, place, time/date and situation  Attention:  Good  Concentration:  Good  Memory:  WNL  Fund of knowledge:   Good  Insight:    Good  Judgment:   Good  Impulse Control:  Good   Risk Assessment: Danger to Self:  No Self-injurious Behavior: No Danger to Others: No Duty to Warn:no Physical Aggression / Violence:No  Access to Firearms a concern: No  Gang Involvement:No   Subjective: The client has recently been sick with the flu.  His wife took care of him at her house.  It is only been within the last few days that he has been up and around.  He was very touched with how she took care of him.  This past week was their 11th anniversary.  He took her to a Insurance risk surveyor out and she again asked him about money since she wanted from him from the house.  He has had this conversation with her numerous times explaining that he is already paid to her.  She refuses to let go of that. The client realizes now a year plus later with separation that his wife maintains the same narrative and refuses to see him as someone who is supportive and helpful.  He feels like he is getting to the end of the road with this relationship.  This makes him sad and anxious as well.  He has been looking into moving to [place], [place] or back to his hometown of [place].  In both places he has job opportunities and plenty of money saved that he could actually retire.  The  client is still distressed and will continue to write and pray about this.  He wants to continue to try to talk with his wife even though that has been less than successful.  I encouraged him to continue with boundaries and assertive communication.  Interventions: Assertiveness/Communication, Solution-Oriented/Positive Psychology and Insight-Oriented  Diagnosis:   ICD-10-CM   1. Adjustment disorder with depressed mood F43.21     Plan: Boundaries, assertiveness.  Albertina Parr [place], [place]

## 2018-05-07 ENCOUNTER — Encounter: Payer: Self-pay | Admitting: Psychiatry

## 2018-05-07 ENCOUNTER — Ambulatory Visit (INDEPENDENT_AMBULATORY_CARE_PROVIDER_SITE_OTHER): Payer: 59 | Admitting: Psychiatry

## 2018-05-07 DIAGNOSIS — F4323 Adjustment disorder with mixed anxiety and depressed mood: Secondary | ICD-10-CM

## 2018-05-07 NOTE — Progress Notes (Signed)
      [facility] Counselor/Therapist Progress Note  Patient ID: Robert [name], MRN: [id number],    Date: 05/07/2018  Time Spent: 51 minutes   Treatment Type: Individual Therapy  Reported Symptoms: Depressed mood and Anxious Mood  Mental Status Exam:  Appearance:   Casual and Well Groomed     Behavior:  Appropriate  Motor:  Normal  Speech/Language:   Clear and Coherent  Affect:  Appropriate  Mood:  anxious and sad  Thought process:  normal  Thought content:    WNL  Sensory/Perceptual disturbances:    WNL  Orientation:  oriented to person, place, time/date and situation  Attention:  Good  Concentration:  Good  Memory:  WNL  Fund of knowledge:   Good  Insight:    Good  Judgment:   Good  Impulse Control:  Fair   Risk Assessment: Danger to Self:  No Self-injurious Behavior: No Danger to Others: No Duty to Warn:no Physical Aggression / Violence:No  Access to Firearms a concern: No  Gang Involvement:No   Subjective: 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  1 of the men from [place] that he knew from a conference friends will be there today.  The client has his PowerPoint ready to present to the Investment banker, operational and other managers.  He feels really good about his chances but has some anxiety related to it.  We talked about positive self talk and staying in the present tense.  The client has not made much progress with his wife.  She continues to treat him nicely but believes the narrative about him that somehow he is suspicious.  This confuses the client.  Of her 5 children he has paid for 3 of them to go to college and yet she believes that he is against her.  He wants to discuss how he can approach her Robert [name] be in a different way to communicate that they are on the same page.  We discussed more assertive ways of approaching her and maybe penning a letter.  The client agrees to consider this.  Interventions:  Assertiveness/Communication, Solution-Oriented/Positive Psychology and Insight-Oriented  Diagnosis:   ICD-10-CM   1. Adjustment disorder with mixed anxiety and depressed mood F43.23     Plan: Letter, mood independent behavior, positive self talk, mindfulness.  Robert [name] Robert [name], [place]

## 2018-05-14 ENCOUNTER — Encounter: Payer: Self-pay | Admitting: Psychiatry

## 2018-05-14 ENCOUNTER — Ambulatory Visit (INDEPENDENT_AMBULATORY_CARE_PROVIDER_SITE_OTHER): Payer: 59 | Admitting: Psychiatry

## 2018-05-14 DIAGNOSIS — F4321 Adjustment disorder with depressed mood: Secondary | ICD-10-CM | POA: Diagnosis not present

## 2018-05-14 NOTE — Progress Notes (Signed)
      [facility] Counselor/Therapist Progress Note  Patient ID: ALYUS MOFIELD, MRN: [id number],    Date: 05/14/2018  Time Spent: 50 minutes  Treatment Type: Individual Therapy  Reported Symptoms: Sad, stressed, tearful.  Mental Status Exam:  Appearance:   Casual and Well Groomed     Behavior:  Appropriate  Motor:  Normal  Speech/Language:   Clear and Coherent  Affect:  Appropriate  Mood:  sad  Thought process:  normal  Thought content:    WNL  Sensory/Perceptual disturbances:    WNL  Orientation:  oriented to person, place, time/date and situation  Attention:  Good  Concentration:  Good  Memory:  WNL  Fund of knowledge:   Good  Insight:    Good  Judgment:   Good  Impulse Control:  Good   Risk Assessment: Danger to Self:  No Self-injurious Behavior: No Danger to Others: No Duty to Warn:no Physical Aggression / Violence:No  Access to Firearms a concern: No  Gang Involvement:No   Subjective: The client reports no change with his wife.  On the way to his appointment today he received a phone call from 1 of his mentors at work.  The client had presented his work on a power cell that he had been working on to the management team.  It was well received.  This past Friday he then presented to the company [employer].  It also went very well.  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  As the client talked he gave a granular amount of detail.  I asked the client that maybe if he was more concise and to the point people might not misinterpret him as easily.  He agreed.  Interventions: Assertiveness/Communication, Solution-Oriented/Positive Psychology and Insight-Oriented  Diagnosis:   ICD-10-CM   1. Adjustment disorder with depressed mood F43.21     Plan: Direct, assertive,  boundaries.  Albertina Parr Allye [place], [place]

## 2018-05-21 ENCOUNTER — Ambulatory Visit (INDEPENDENT_AMBULATORY_CARE_PROVIDER_SITE_OTHER): Payer: 59 | Admitting: Psychiatry

## 2018-05-21 ENCOUNTER — Encounter: Payer: Self-pay | Admitting: Psychiatry

## 2018-05-21 DIAGNOSIS — F4321 Adjustment disorder with depressed mood: Secondary | ICD-10-CM | POA: Diagnosis not present

## 2018-05-21 NOTE — Progress Notes (Signed)
      [facility] Counselor/Therapist Progress Note  Patient ID: Robert [name], MRN: [id number],    Date: 05/21/2018   Time Spent: 51 minutes   Treatment Type: Individual Therapy  Reported Symptoms: sad, disappointment, stress, irritable.  Mental Status Exam:  Appearance:   Casual and Well Groomed     Behavior:  Appropriate  Motor:  Normal  Speech/Language:   Clear and Coherent  Affect:  Appropriate  Mood:  irritable, sad and disappointment  Thought process:  normal  Thought content:    WNL  Sensory/Perceptual disturbances:    WNL  Orientation:  oriented to person, place, time/date and situation  Attention:  Good  Concentration:  Good  Memory:  WNL  Fund of knowledge:   Good  Insight:    Good  Judgment:   Good  Impulse Control:  Good   Risk Assessment: Danger to Self:  No Self-injurious Behavior: No Danger to Others: No Duty to Warn:no Physical Aggression / Violence:No  Access to Firearms a concern: No  Gang Involvement:No   Subjective: The client reports that his sponsor accused him of not doing the proper documentation.  He states, "he maligned my work Nature conservation officer."  The sponsor and his boss said they were going to bring him before the Investment banker, operational.  After the client responded and then an email regarding their charges they realized they did not have a good case and dropped it.  The client meets with the chief technology offer service afternoon to discuss his work in the project he has going on.  The client has been working harder at trying to be clearer in his communication trying to be more succinct.  He has been very upset that he was accused of impropriety with no firm reasons.  We discussed boundaries and what those would look like in the workplace.  The client tends to over explain things which I believe causes people to get lost in all his words.  He needs to be more direct and to the point about what he is communicating.  We also  discussed what assertiveness look like.  Interventions: Assertiveness/Communication, Solution-Oriented/Positive Psychology and Insight-Oriented  Diagnosis:   ICD-10-CM   1. Adjustment disorder with depressed mood F43.21     Plan: Assertiveness, boundaries, clear communication.  This record has been created using [employer].  Chart creation errors have been sought, but Mathis Cashman not always have been located and corrected. Such creation errors do not reflect on the standard of medical care.   Lachelle Rissler, [place]

## 2018-05-23 ENCOUNTER — Other Ambulatory Visit: Payer: Self-pay | Admitting: Cardiology

## 2018-05-27 NOTE — Telephone Encounter (Signed)
Called to let the pt know that we have received a request to refill coumadin but he needs an appt however the mailbox is full

## 2018-05-28 ENCOUNTER — Ambulatory Visit (INDEPENDENT_AMBULATORY_CARE_PROVIDER_SITE_OTHER): Payer: 59 | Admitting: Pharmacist Clinician (PhC)/ Clinical Pharmacy Specialist

## 2018-05-28 DIAGNOSIS — Z7901 Long term (current) use of anticoagulants: Secondary | ICD-10-CM | POA: Diagnosis not present

## 2018-05-28 DIAGNOSIS — Z952 Presence of prosthetic heart valve: Secondary | ICD-10-CM | POA: Diagnosis not present

## 2018-05-28 DIAGNOSIS — Z8673 Personal history of transient ischemic attack (TIA), and cerebral infarction without residual deficits: Secondary | ICD-10-CM

## 2018-05-28 LAB — POCT INR: INR: 2 (ref 2.0–3.0)

## 2018-06-03 ENCOUNTER — Ambulatory Visit (INDEPENDENT_AMBULATORY_CARE_PROVIDER_SITE_OTHER): Payer: 59 | Admitting: Psychiatry

## 2018-06-03 ENCOUNTER — Encounter: Payer: Self-pay | Admitting: Psychiatry

## 2018-06-03 DIAGNOSIS — F4321 Adjustment disorder with depressed mood: Secondary | ICD-10-CM | POA: Diagnosis not present

## 2018-06-03 NOTE — Progress Notes (Signed)
      [facility] Counselor/Therapist Progress Note  Patient ID: Robert [name], MRN: [id number],    Date: 06/03/2018  Time Spent: 52 minutes   Treatment Type: Individual Therapy  Reported Symptoms: anxiety  Mental Status Exam:  Appearance:   Casual     Behavior:  Appropriate  Motor:  Normal  Speech/Language:   Clear and Coherent  Affect:  Appropriate  Mood:  anxious  Thought process:  normal  Thought content:    WNL  Sensory/Perceptual disturbances:    WNL  Orientation:  oriented to person, place, time/date and situation  Attention:  Good  Concentration:  Good  Memory:  WNL  Fund of knowledge:   Good  Insight:    Good  Judgment:   Good  Impulse Control:  Good   Risk Assessment: Danger to Self:  No Self-injurious Behavior: No Danger to Others: No Duty to Warn:no Physical Aggression / Violence:No  Access to Firearms a concern: No  Gang Involvement:No   Subjective: The client reports that things have gone a little bit better at work.  He has tried to be more concise in his conversation with others.  He is reading books on how to influence others which has had a positive impact for him.  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  The client continues to have less than satisfactory interactions with his wife.  They can be together been pleasant even have a good sexual interaction but then the conversation turns negative.  The client has seen a slow buildup of attitudes and behaviors that she has that concern him.  He is going to continue to evaluate to determine if they can actually make their marriage work. 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  Interventions: Assertiveness/Communication, Solution-Oriented/Positive Psychology and Insight-Oriented  Diagnosis:   ICD-10-CM   1. Adjustment disorder with depressed mood F43.21     Plan: Assertiveness, boundaries,  positive self talk.  This record has been created using [employer].  Chart creation errors have been sought, but Tommey Barret not always have been located and corrected. Such creation errors do not reflect on the standard of medical care.   Miryam Mcelhinney, [place]

## 2018-06-13 ENCOUNTER — Telehealth: Payer: Self-pay | Admitting: *Deleted

## 2018-06-13 NOTE — Telephone Encounter (Signed)

## 2018-06-16 ENCOUNTER — Other Ambulatory Visit: Payer: Self-pay

## 2018-06-16 ENCOUNTER — Ambulatory Visit (INDEPENDENT_AMBULATORY_CARE_PROVIDER_SITE_OTHER): Payer: 59 | Admitting: *Deleted

## 2018-06-16 DIAGNOSIS — Z8673 Personal history of transient ischemic attack (TIA), and cerebral infarction without residual deficits: Secondary | ICD-10-CM | POA: Diagnosis not present

## 2018-06-16 DIAGNOSIS — Z7901 Long term (current) use of anticoagulants: Secondary | ICD-10-CM | POA: Diagnosis not present

## 2018-06-16 DIAGNOSIS — Z952 Presence of prosthetic heart valve: Secondary | ICD-10-CM

## 2018-06-16 LAB — POCT INR: INR: 2.7 (ref 2.0–3.0)

## 2018-06-16 NOTE — Patient Instructions (Addendum)
Description   Continue taking 1 tablet each Monday and Friday, 1.5 tablets all other days. Repeat INR in 4 weeks at [facility] per pt.

## 2018-06-18 ENCOUNTER — Ambulatory Visit (INDEPENDENT_AMBULATORY_CARE_PROVIDER_SITE_OTHER): Payer: 59 | Admitting: Psychiatry

## 2018-06-18 ENCOUNTER — Other Ambulatory Visit: Payer: Self-pay

## 2018-06-18 ENCOUNTER — Encounter: Payer: Self-pay | Admitting: Psychiatry

## 2018-06-18 DIAGNOSIS — F4321 Adjustment disorder with depressed mood: Secondary | ICD-10-CM

## 2018-06-18 NOTE — Progress Notes (Signed)
      [facility] Counselor/Therapist Progress Note  Patient ID: Robert [name], MRN: [id number],    Date: 06/18/2018  Time Spent: 50 minutes   Treatment Type: Individual Therapy  Reported Symptoms: irritable, frustrated.  Mental Status Exam:  Appearance:   Casual and Well Groomed     Behavior:  Appropriate  Motor:  Normal  Speech/Language:   Clear and Coherent  Affect:  Appropriate  Mood:  irritable  Thought process:  normal  Thought content:    WNL  Sensory/Perceptual disturbances:    WNL  Orientation:  oriented to person, place, time/date and situation  Attention:  Good  Concentration:  Good  Memory:  WNL  Fund of knowledge:   Good  Insight:    Good  Judgment:   Good  Impulse Control:  Good   Risk Assessment: Danger to Self:  No Self-injurious Behavior: No Danger to Others: No Duty to Warn:no Physical Aggression / Violence:No  Access to Firearms a concern: No  Gang Involvement:No   Subjective: I connected with patient by a video enabled telemedicine application or telephone, with their informed consent, and verified patient privacy and that I am speaking with the correct person using two identifiers.  I was located at [place] and patient at home.  The client reports that he had received a phone call from his human resources about his current job situation.  He notes that he has not had any raise and he is at the lower end of his pay scale.  He cannot seem to get any increase in his compensation.  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  He reminded the HR representative that there is an existing contract in place which they agreed to years ago.  He has been frustrated by the lack of engagement in the adversarial way they have gone about engaging with him. Due to the coronavirus he is currently working at home. The client also has not seen much change in his relationship with his wife.  They continue  to live separately but she will not back down about the narrative she has about him.  He is not satisfied with the current circumstances and he is brainstorming to come up with a different way of engaging her. We discussed other ways of setting healthy boundaries not only with his wife but with her children who are not respectful of her.  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  They also triangulate their mother into the conflicts which results in all kinds of chaos.  The client agrees and he will tread lightly. .  Interventions: Assertiveness/Communication, Solution-Oriented/Positive Psychology and Insight-Oriented  Diagnosis:   ICD-10-CM   1. Adjustment disorder with depressed mood F43.21     Plan: Assertiveness, boundaries, self-care.  This record has been created using [employer].  Chart creation errors have been sought, but Errin Whitelaw not always have been located and corrected. Such creation errors do not reflect on the standard of medical care.   Shahzain Kiester, [place]

## 2018-06-25 ENCOUNTER — Ambulatory Visit (INDEPENDENT_AMBULATORY_CARE_PROVIDER_SITE_OTHER): Payer: 59 | Admitting: Psychiatry

## 2018-06-25 ENCOUNTER — Encounter: Payer: Self-pay | Admitting: Psychiatry

## 2018-06-25 DIAGNOSIS — F4321 Adjustment disorder with depressed mood: Secondary | ICD-10-CM

## 2018-06-25 NOTE — Progress Notes (Signed)
      [facility] Counselor/Therapist Progress Note  Patient ID: Robert [name], MRN: [id number],    Date: 06/25/2018  Time Spent: 50 minutes   Treatment Type: Individual Therapy  Reported Symptoms: irritable, discouraged.  Mental Status Exam:  Appearance:   Casual and Well Groomed     Behavior:  Appropriate  Motor:  Normal  Speech/Language:   Clear and Coherent  Affect:  Appropriate  Mood:  irritable and discouraged.  Thought process:  normal  Thought content:    WNL  Sensory/Perceptual disturbances:    WNL  Orientation:  oriented to person, place, time/date and situation  Attention:  Good  Concentration:  Good  Memory:  WNL  Fund of knowledge:   Good  Insight:    Good  Judgment:   Good  Impulse Control:  Good   Risk Assessment: Danger to Self:  No Self-injurious Behavior: No Danger to Others: No Duty to Warn:no Physical Aggression / Violence:No  Access to Firearms a concern: No  Gang Involvement:No   Subjective: I connected with patient by a video enabled telemedicine application or telephone, with their informed consent, and verified patient privacy and that I am speaking with the correct person using two identifiers.  I was located at [place] and patient at home. The client is still working from home due to the pandemic.  He has not found much traction and getting a raise with his job.  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  That was disappointing for him.  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 The client also is frustrated in his marriage.  He and his wife continue to be separated.  He goes over to her house but she rarely comes to his.  The client articulated a logical argument about their finances and their property today.  1 of the difficulties the client has is that when he is talking his mind works so quickly he can jump down different  rabbit trails.  Then people get lost.  I asked the client to write out a concise logical argument that he can either let his wife read or read to her so then they could have a discussion.  Her tendency is to over talk him and already make up her mind about what he might be saying.  The client agrees says this is a great idea.  He will do so.   Interventions: Assertiveness/Communication, Solution-Oriented/Positive Psychology and Insight-Oriented  Diagnosis:   ICD-10-CM   1. Adjustment disorder with depressed mood F43.21     Plan: boundaries, assertiveness, exercise, letter to wife.  This record has been created using [employer].  Chart creation errors have been sought, but Robert [name] not always have been located and corrected. Such creation errors do not reflect on the standard of medical care.   Robert [name], [facility]

## 2018-07-09 ENCOUNTER — Other Ambulatory Visit: Payer: Self-pay

## 2018-07-09 ENCOUNTER — Ambulatory Visit (INDEPENDENT_AMBULATORY_CARE_PROVIDER_SITE_OTHER): Payer: 59 | Admitting: Psychiatry

## 2018-07-09 ENCOUNTER — Encounter: Payer: Self-pay | Admitting: Psychiatry

## 2018-07-09 DIAGNOSIS — F4321 Adjustment disorder with depressed mood: Secondary | ICD-10-CM | POA: Diagnosis not present

## 2018-07-09 NOTE — Progress Notes (Signed)
      [facility] Counselor/Therapist Progress Note  Patient ID: Robert [name], MRN: [id number],    Date: 07/09/2018  Time Spent: 50 minutes   Treatment Type: Individual Therapy  Reported Symptoms: stress, sad  Mental Status Exam:  Appearance:   NA     Behavior:  Appropriate  Motor:  Normal  Speech/Language:   Clear and Coherent  Affect:  Appropriate  Mood:  sad and stress  Thought process:  normal  Thought content:    WNL  Sensory/Perceptual disturbances:    WNL  Orientation:  oriented to person, place, time/date and situation  Attention:  Good  Concentration:  Good  Memory:  WNL  Fund of knowledge:   Good  Insight:    Good  Judgment:   Good  Impulse Control:  Good   Risk Assessment: Danger to Self:  No Self-injurious Behavior: No Danger to Others: No Duty to Warn:no Physical Aggression / Violence:No  Access to Firearms a concern: No  Gang Involvement:No   Subjective: I connected with patient by a video enabled telemedicine application or telephone, with their informed consent, and verified patient privacy and that I am speaking with the correct person using two identifiers.  I was located at [place] and patient at home. The client reports that he has sold his old house for which he is very grateful.  He states that his wife is in a tailspin.  She has horses she keeps on someone's property for $400 a month.  Since there is no water on that property the neighbor to the land allows them to use his well to water the horses.  His wife had left the water on and ran the well dry.  She has to move the horses somewhere else. Continue conversation over [place] she asked him directly, "what do you want?"  The client had never been asked that before so he is taken this week to write it down.  He tells me today that his greatest desire with his wife was to raise her 6 boys and his 3 boys to carry on [place] values to the next generation.  2 model the behavior that  they were raised with.  The client agrees that he needs to be very concise write all this down and give it to her for her to read so that she can understand where he is coming from. Her continued narrative of him is that he is greedy and "mean".  The client hopes for a good outcome.   Interventions: Assertiveness/Communication, Solution-Oriented/Positive Psychology and Insight-Oriented  Diagnosis:   ICD-10-CM   1. Adjustment disorder with depressed mood F43.21     Plan: Assertiveness, boundaries, journal, exercise, self care, mood independent behavior.  This record has been created using [employer].  Chart creation errors have been sought, but Robert [name] not always have been located and corrected. Such creation errors do not reflect on the standard of medical care.   Liliane Mallis, [facility]

## 2018-07-11 ENCOUNTER — Telehealth: Payer: Self-pay

## 2018-07-11 NOTE — Telephone Encounter (Signed)

## 2018-07-14 ENCOUNTER — Other Ambulatory Visit: Payer: Self-pay

## 2018-07-14 ENCOUNTER — Ambulatory Visit (INDEPENDENT_AMBULATORY_CARE_PROVIDER_SITE_OTHER): Payer: 59 | Admitting: Pharmacist

## 2018-07-14 DIAGNOSIS — Z7901 Long term (current) use of anticoagulants: Secondary | ICD-10-CM

## 2018-07-14 DIAGNOSIS — Z8673 Personal history of transient ischemic attack (TIA), and cerebral infarction without residual deficits: Secondary | ICD-10-CM

## 2018-07-14 DIAGNOSIS — Z952 Presence of prosthetic heart valve: Secondary | ICD-10-CM

## 2018-07-14 LAB — POCT INR: INR: 2 (ref 2.0–3.0)

## 2018-07-23 ENCOUNTER — Other Ambulatory Visit: Payer: Self-pay

## 2018-07-23 ENCOUNTER — Ambulatory Visit (INDEPENDENT_AMBULATORY_CARE_PROVIDER_SITE_OTHER): Payer: 59 | Admitting: Psychiatry

## 2018-07-23 ENCOUNTER — Encounter: Payer: Self-pay | Admitting: Psychiatry

## 2018-07-23 DIAGNOSIS — F4321 Adjustment disorder with depressed mood: Secondary | ICD-10-CM

## 2018-07-23 NOTE — Progress Notes (Signed)
      [facility] Counselor/Therapist Progress Note  Patient ID: JACEON HEIBERGER, MRN: [id number],    Date: 07/23/2018  Time Spent: 52 minutes   Treatment Type: Individual Therapy  Reported Symptoms: frustration, sadness  Mental Status Exam:  Appearance:   Casual and Well Groomed     Behavior:  Appropriate  Motor:  Normal  Speech/Language:   Clear and Coherent  Affect:  Appropriate  Mood:  irritable and sad  Thought process:  normal  Thought content:    WNL  Sensory/Perceptual disturbances:    WNL  Orientation:  oriented to person, place, time/date and situation  Attention:  Good  Concentration:  Good  Memory:  WNL  Fund of knowledge:   Good  Insight:    Good  Judgment:   Good  Impulse Control:  Good   Risk Assessment: Danger to Self:  No Self-injurious Behavior: No Danger to Others: No Duty to Warn:no Physical Aggression / Violence:No  Access to Firearms a concern: No  Gang Involvement:No   Subjective: Telehealth visit I connected with patient by a video enabled telemedicine/telehealth application or telephone, with his informed consent, and verified patient privacy and that I am speaking with the correct person using two identifiers.  I was located at my office and patient at his home.  We discussed the limitations, risks, and security and privacy concerns associated with telehealth services and the availability of in-person appointments, including awareness that he Skylinn Vialpando be responsible for charges related to the service, and he expressed understanding and agreed to proceed.  I discussed treatment planning with him, with opportunity to ask and answer all questions. Agreed with the plan, demonstrated an understanding of the instructions, and made him aware to call our office if symptoms worsen or he feels he is in a crisis state and needs immediate contact.  The client has come to finally understand that his wife is not going to change her negative narrative about who he is.   He recently sold a house that he owned from his first marriage.  His wife wanted to know why she should not get some of that money.  He has realized that when her children or she needs something her expectation is that he will provide it.  When he asks for input on behaviors he thinks the children should be doing she rejects his advice.  Her belief is that because he has more money than she does he should give some of that to her and to her children.  He does not agree with that orientation and has realized that although his wife is very pleasant, if something feels good to her that it must be good if it feels bad it must be bad.  She does not have the discernment to evaluate things against criteria.  He sees this as a major flaw and has decided to file for separation.  He will would like to write her letter explaining this.  Interventions: Assertiveness/Communication, Solution-Oriented/Positive Psychology and Insight-Oriented  Diagnosis:   ICD-10-CM   1. Adjustment disorder with depressed mood F43.21     Plan: letter to wife, get separation agreement.  This record has been created using [employer].  Chart creation errors have been sought, but Alfons Sulkowski not always have been located and corrected. Such creation errors do not reflect on the standard of medical care.  Tonyetta Berko, [facility]

## 2018-08-04 ENCOUNTER — Ambulatory Visit (INDEPENDENT_AMBULATORY_CARE_PROVIDER_SITE_OTHER): Payer: 59 | Admitting: Psychiatry

## 2018-08-04 ENCOUNTER — Other Ambulatory Visit: Payer: Self-pay

## 2018-08-04 ENCOUNTER — Encounter: Payer: Self-pay | Admitting: Psychiatry

## 2018-08-04 DIAGNOSIS — F4321 Adjustment disorder with depressed mood: Secondary | ICD-10-CM | POA: Diagnosis not present

## 2018-08-04 NOTE — Progress Notes (Signed)
[facility] Counselor/Therapist Progress Note  Patient ID: Robert [name], MRN: [id number],    Date: 08/04/2018  Time Spent: 49 minutes   Treatment Type: Individual Therapy  Reported Symptoms: irritable, sad  Mental Status Exam:  Appearance:   Casual and Well Groomed     Behavior:  Appropriate  Motor:  Normal  Speech/Language:   Clear and Coherent  Affect:  Appropriate  Mood:  irritable and sad  Thought process:  normal  Thought content:    WNL  Sensory/Perceptual disturbances:    WNL  Orientation:  oriented to person, place, time/date and situation  Attention:  Good  Concentration:  Good  Memory:  WNL  Fund of knowledge:   Good  Insight:    Good  Judgment:   Good  Impulse Control:  Good   Risk Assessment: Danger to Self:  No Self-injurious Behavior: No Danger to Others: No Duty to Warn:no Physical Aggression / Violence:No  Access to Firearms a concern: No  Gang Involvement:No   Subjective: Telehealth visit I connected with patient by a video enabled telemedicine/telehealth application or telephone, with his informed consent, and verified patient privacy and that I am speaking with the correct person using two identifiers.  I was located at my office and patient at his home.  We discussed the limitations, risks, and security and privacy concerns associated with telehealth services and the availability of in-person appointments, including awareness that he Julya Alioto be responsible for charges related to the service, and he expressed understanding and agreed to proceed.  I discussed treatment planning with him, with opportunity to ask and answer all questions. Agreed with the plan, demonstrated an understanding of the instructions, and made him aware to call our office if symptoms worsen or he feels he is in a crisis state and needs immediate contact. The client states that he and his wife are not able to come to agreement about who owes which person what monies.  They have  lived separately for 15 months yet still seeing each other. He spends the night at her house regularly.  He states it is usually the next morning when she demands reimbursement for items he feels he does not have to reimburse her for.  She then goes on to make disparaging remarks about his son's which adds nothing to the conversation. The client understands that his wife has beliefs that are not tied to any evaluative process of value or accomplishment.  This is especially true as it relates to her children.  If other people give their children cars at age 34 then she feels all of her children should have cars at age 26 no matter what their behavior is even if they are not working hard in high school.  The client fundamentally disagrees and has had numerous conversations not only with her but with this therapist.  Today I pushed the client to make a decision about moving forward or not, with getting divorced.  He clearly articulates that they are on completely different pages when it comes to parenting.  He wants to bring the stepchildren and his children altogether as one family and all of her behavior indicates otherwise. He agrees that they will not come to agreement so he will talk to his lawyer today about writing up a separation agreement.   Interventions: Assertiveness/Communication, Solution-Oriented/Positive Psychology and Insight-Oriented  Diagnosis:   ICD-10-CM   1. Adjustment disorder with depressed mood F43.21     Plan: letter, mediation, boundaries, assertiveness, contact  lawyer.  This record has been created using [employer].  Chart creation errors have been sought, but Kiwanna Spraker not always have been located and corrected. Such creation errors do not reflect on the standard of medical care.  Zymier Rodgers, [facility]

## 2018-08-05 ENCOUNTER — Telehealth: Payer: Self-pay

## 2018-08-05 NOTE — Telephone Encounter (Signed)
CALLED TO SCREEN THE PT FOR COUMADIN BUT THE PT HAD TO PUSH BACK APPT DUE TO THE FACT THE PT WENT A WEEK WITHOUT TAKING IT ANDD FORGOT

## 2018-08-06 ENCOUNTER — Ambulatory Visit: Payer: 59 | Admitting: Psychiatry

## 2018-08-20 ENCOUNTER — Other Ambulatory Visit: Payer: Self-pay

## 2018-08-20 ENCOUNTER — Encounter: Payer: Self-pay | Admitting: Psychiatry

## 2018-08-20 ENCOUNTER — Ambulatory Visit (INDEPENDENT_AMBULATORY_CARE_PROVIDER_SITE_OTHER): Payer: 59 | Admitting: Psychiatry

## 2018-08-20 DIAGNOSIS — F4321 Adjustment disorder with depressed mood: Secondary | ICD-10-CM | POA: Diagnosis not present

## 2018-08-20 NOTE — Progress Notes (Signed)
      [facility] Counselor/Therapist Progress Note  Patient ID: TONIE VIZCARRONDO, MRN: [id number],    Date: 08/20/2018  Time Spent: 50 minutes   Treatment Type: Individual Therapy  Reported Symptoms: sad, frustrated  Mental Status Exam:  Appearance:   Casual     Behavior:  Appropriate  Motor:  Normal  Speech/Language:   Clear and Coherent  Affect:  Appropriate  Mood:  sad  Thought process:  normal  Thought content:    WNL  Sensory/Perceptual disturbances:    WNL  Orientation:  oriented to person, place, time/date and situation  Attention:  Good  Concentration:  Good  Memory:  WNL  Fund of knowledge:   Good  Insight:    Good  Judgment:   Good  Impulse Control:  Good   Risk Assessment: Danger to Self:  No Self-injurious Behavior: No Danger to Others: No Duty to Warn:no Physical Aggression / Violence:No  Access to Firearms a concern: No  Gang Involvement:No   Subjective: Telehealth visit I connected with patient by a video enabled telemedicine/telehealth application or telephone, with his informed consent, and verified patient privacy and that I am speaking with the correct person using two identifiers.  I was located at my office and patient at his home.  We discussed the limitations, risks, and security and privacy concerns associated with telehealth services and the availability of in-person appointments, including awareness that he Azir Muzyka be responsible for charges related to the service, and he expressed understanding and agreed to proceed.  I discussed treatment planning with him, with opportunity to ask and answer all questions. Agreed with the plan, demonstrated an understanding of the instructions, and made him aware to call our office if symptoms worsen or he feels he is in a crisis state and needs immediate contact.  The client had a conflict with his wife's second oldest son.  When he was visiting the client and the son got into an argument.  The client raised his  voice and called him a name.  This upset his wife and seemed to seal the fact that they were not going to reconcile.  The client was upset that her children can act towards him with impunity but when he gets angry or raises his voice at all becomes his fault.  They still have not come to a conclusion on the settlement of assets.  The client has become so frustrated that he has chosen not to go back over to his wife's house.  He is brooding over a number of inequities that exist between them such as care of her animals that are still in his house and division of household furniture.  The client admits that his name-calling was inappropriate.  He is in no way angry enough for any physical violence.  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  Interventions: Assertiveness/Communication, Solution-Oriented/Positive Psychology and Insight-Oriented  Diagnosis:   ICD-10-CM   1. Adjustment disorder with depressed mood F43.21     Plan: assertiveness, boundaries.  Destine Zirkle, [facility]

## 2018-08-22 ENCOUNTER — Telehealth: Payer: Self-pay

## 2018-08-22 NOTE — Telephone Encounter (Signed)
lmom for prescreen  

## 2018-08-26 ENCOUNTER — Ambulatory Visit (INDEPENDENT_AMBULATORY_CARE_PROVIDER_SITE_OTHER): Payer: 59 | Admitting: Pharmacist

## 2018-08-26 ENCOUNTER — Other Ambulatory Visit: Payer: Self-pay

## 2018-08-26 DIAGNOSIS — Z952 Presence of prosthetic heart valve: Secondary | ICD-10-CM

## 2018-08-26 DIAGNOSIS — Z8673 Personal history of transient ischemic attack (TIA), and cerebral infarction without residual deficits: Secondary | ICD-10-CM | POA: Diagnosis not present

## 2018-08-26 DIAGNOSIS — Z7901 Long term (current) use of anticoagulants: Secondary | ICD-10-CM

## 2018-08-26 LAB — POCT INR: INR: 3.2 — AB (ref 2.0–3.0)

## 2018-09-03 ENCOUNTER — Other Ambulatory Visit: Payer: Self-pay

## 2018-09-03 ENCOUNTER — Encounter: Payer: Self-pay | Admitting: Psychiatry

## 2018-09-03 ENCOUNTER — Ambulatory Visit (INDEPENDENT_AMBULATORY_CARE_PROVIDER_SITE_OTHER): Payer: 59 | Admitting: Psychiatry

## 2018-09-03 DIAGNOSIS — F4321 Adjustment disorder with depressed mood: Secondary | ICD-10-CM | POA: Diagnosis not present

## 2018-09-03 NOTE — Progress Notes (Signed)
      [facility] Counselor/Therapist Progress Note  Patient ID: Robert [name], MRN: [id number],    Date: 09/03/2018  Time Spent: 50 minures   Treatment Type: Individual Therapy  Reported Symptoms: sad, frustrated.  Mental Status Exam:  Appearance:   Casual     Behavior:  Appropriate  Motor:  Normal  Speech/Language:   Clear and Coherent  Affect:  Appropriate  Mood:  sad  Thought process:  normal  Thought content:    WNL  Sensory/Perceptual disturbances:    WNL  Orientation:  oriented to person, place, time/date and situation  Attention:  Good  Concentration:  Good  Memory:  WNL  Fund of knowledge:   Good  Insight:    Good  Judgment:   Good  Impulse Control:  Good   Risk Assessment: Danger to Self:  No Self-injurious Behavior: No Danger to Others: No Duty to Warn:no Physical Aggression / Violence:No  Access to Firearms a concern: No  Gang Involvement:No   Subjective: I met with the client face-to-face.  We both had facemasks. The client states his wife is still angry with him about raising his voice to her son.  The client is puzzled because her children can be disrespectful to him and she will not correct it. They continue to go to church together but they are not together.  He no longer comes over for dinners because the tone in the house is so negative towards him. The client has contacted his lawyer for a separation agreement.  His lawyer is also drafting a letter about the fact that the house he lives in was never bought with any joint funds.  As a result she has no right to any part of it.  She does not believe this in continues to harass him about it.  The whole situation makes him very sad because he would like to remain married.  It has become clear to him that what his wife says and what she actually does are 2 different things.  She always seems to err on the side of her children against him. The client is working towards being appropriate and is loving as he  can.  He continues to help her out by doing projects on her property such as taking down fallen trees.  He will continue to be assertive in his communication and set good boundaries.  Interventions: Assertiveness/Communication, Solution-Oriented/Positive Psychology and Insight-Oriented  Diagnosis:   ICD-10-CM   1. Adjustment disorder with depressed mood F43.21     Plan: Boundaries, self-care, exercise, assertive communication.  This record has been created using [employer].  Chart creation errors have been sought, but Jaimen Melone not always have been located and corrected. Such creation errors do not reflect on the standard of medical care.  Marah Park, [facility]

## 2018-09-17 ENCOUNTER — Other Ambulatory Visit: Payer: Self-pay

## 2018-09-17 ENCOUNTER — Encounter: Payer: Self-pay | Admitting: Psychiatry

## 2018-09-17 ENCOUNTER — Ambulatory Visit (INDEPENDENT_AMBULATORY_CARE_PROVIDER_SITE_OTHER): Payer: 59 | Admitting: Psychiatry

## 2018-09-17 DIAGNOSIS — F4321 Adjustment disorder with depressed mood: Secondary | ICD-10-CM

## 2018-09-17 NOTE — Progress Notes (Signed)
      [facility] Counselor/Therapist Progress Note  Patient ID: Robert [name], MRN: [id number],    Date: 09/17/2018  Time Spent: 50 minutes   Treatment Type: Individual Therapy  Reported Symptoms: sad, disappointed  Mental Status Exam:  Appearance:   Casual     Behavior:  Appropriate  Motor:  Normal  Speech/Language:   Clear and Coherent  Affect:  Appropriate  Mood:  sad  Thought process:  normal  Thought content:    WNL  Sensory/Perceptual disturbances:    WNL  Orientation:  oriented to person, place, time/date and situation  Attention:  Good  Concentration:  Good  Memory:  WNL  Fund of knowledge:   Good  Insight:    Good  Judgment:   Good  Impulse Control:  Good   Risk Assessment: Danger to Self:  No Self-injurious Behavior: No Danger to Others: No Duty to Warn:no Physical Aggression / Violence:No  Access to Firearms a concern: No  Gang Involvement:No   Subjective: I met with the client face-to-face.  We both had facemasks. The client states that he has had a discussion with his new supervisor.  The manager who had negative feelings towards him had left.  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  His project that he has been working on has been sidelined.  He is feeling unappreciated and under used.  He fully expects that he Robert [name] be terminated.  He states he is prepared and could easily retire now with no difficulty. The client made dinner for his son's on Father's Day.  His wife had left a cockatiel at the house when she moved out.  He is cared for for the last year and a half.  He states he does not care for birds but has been diligent about keeping it.  He has been able to find a new home for it.  He and his wife had discussed him giving the bird away.  That night when she came over she had apparently changed her mind.  This sealed in the client's mind  that he needs to move forward with filing for divorce. Today the client is very disappointed over these events.  He feels misunderstood and unaccepted for his contributions both at work and with his wife.  I discussed with the client about what he would need to do to help make this transition easier for him.  The client Javonna Balli fly out to [place] to visit with his 12 year old father.  He will continue to exercise and do the self-care that we had discussed.   Interventions: Assertiveness/Communication, Motivational Interviewing, Solution-Oriented/Positive Psychology and Insight-Oriented  Diagnosis:   ICD-10-CM   1. Adjustment disorder with depressed mood  F43.21     Plan: Exercise, self-care, boundaries, assertiveness.  This record has been created using [employer].  Chart creation errors have been sought, but Cinderella Christoffersen not always have been located and corrected. Such creation errors do not reflect on the standard of medical care.  Zunaira Lamy, [facility]

## 2018-09-18 ENCOUNTER — Telehealth: Payer: Self-pay

## 2018-09-18 NOTE — Telephone Encounter (Signed)

## 2018-09-19 ENCOUNTER — Other Ambulatory Visit: Payer: Self-pay

## 2018-09-19 ENCOUNTER — Ambulatory Visit (INDEPENDENT_AMBULATORY_CARE_PROVIDER_SITE_OTHER): Payer: 59 | Admitting: *Deleted

## 2018-09-19 DIAGNOSIS — Z7901 Long term (current) use of anticoagulants: Secondary | ICD-10-CM | POA: Diagnosis not present

## 2018-09-19 DIAGNOSIS — Z8673 Personal history of transient ischemic attack (TIA), and cerebral infarction without residual deficits: Secondary | ICD-10-CM | POA: Diagnosis not present

## 2018-09-19 DIAGNOSIS — Z952 Presence of prosthetic heart valve: Secondary | ICD-10-CM

## 2018-09-19 LAB — POCT INR: INR: 3 (ref 2.0–3.0)

## 2018-09-19 NOTE — Patient Instructions (Signed)
Description   Continue taking 1 tablet each Monday and Friday, 1.5 tablets all other days. Repeat 4 weeks

## 2018-09-23 ENCOUNTER — Other Ambulatory Visit: Payer: Self-pay | Admitting: Cardiology

## 2018-10-01 ENCOUNTER — Encounter: Payer: Self-pay | Admitting: Psychiatry

## 2018-10-01 ENCOUNTER — Ambulatory Visit (INDEPENDENT_AMBULATORY_CARE_PROVIDER_SITE_OTHER): Payer: 59 | Admitting: Psychiatry

## 2018-10-01 ENCOUNTER — Other Ambulatory Visit: Payer: Self-pay

## 2018-10-01 DIAGNOSIS — F4321 Adjustment disorder with depressed mood: Secondary | ICD-10-CM

## 2018-10-01 NOTE — Progress Notes (Signed)
      [facility] Counselor/Therapist Progress Note  Patient ID: THIERNO HUN, MRN: [id number],    Date: 10/01/2018  Time Spent: 50 minutes   Treatment Type: Individual Therapy  Reported Symptoms: sad, irritability.  Mental Status Exam:  Appearance:   Casual and Neat     Behavior:  Appropriate  Motor:  Normal  Speech/Language:   Clear and Coherent  Affect:  Appropriate  Mood:  irritable and sad  Thought process:  normal  Thought content:    WNL  Sensory/Perceptual disturbances:    WNL  Orientation:  oriented to person, place, time/date and situation  Attention:  Good  Concentration:  Good  Memory:  WNL  Fund of knowledge:   Good  Insight:    Good  Judgment:   Good  Impulse Control:  Good   Risk Assessment: Danger to Self:  No Self-injurious Behavior: No Danger to Others: No Duty to Warn:no Physical Aggression / Violence:No  Access to Firearms a concern: No  Gang Involvement:No   Subjective: I met with the client face-to-face.  We both had facemasks. The client decided to do the part of his job that he loves.  When he did that he found he did not like it as much as he thought he did.  He then asked himself the question, "do I retire?"  He mentioned this to his immediate supervisor.  He has not pulled the trigger on it just yet. The client has been asked by 1 of the higher bosses for a analysis of the project that he is working on.  The client has already done these types of analysis in the past.  He feels that he is being jerked around.   He wants to go to this person and say "quit pulling the resources and leave me alone so I can accomplish the project."  We discussed that he most likely should write out what he wants to say and then take some time editing it.  When the client speaks he tends to run off on rabbit trails which can be confusing for some people. The client just returned from [place].  His father is in the final 6 months of life.  Client states he had a good  time with him.  I provided the client with additional article on palliative care and pain management at the end of life.  He is very interested in reading this so he can make sure his father is getting the care that he needs. The client also wants to talk with his wife and have a different conversation explaining that they are actually both on the same page.  Again, I encouraged the client to write out what he wants to say edited heavily and then present.  The client agrees.  Interventions: Assertiveness/Communication, Motivational Interviewing, Solution-Oriented/Positive Psychology and Insight-Oriented  Diagnosis:   ICD-10-CM   1. Adjustment disorder with depressed mood  F43.21     Plan: journal, boundaries.  This record has been created using [employer].  Chart creation errors have been sought, but Lorice Lafave not always have been located and corrected. Such creation errors do not reflect on the standard of medical care.  Leahmarie Gasiorowski, [facility]

## 2018-10-14 ENCOUNTER — Other Ambulatory Visit: Payer: Self-pay

## 2018-10-14 ENCOUNTER — Ambulatory Visit (INDEPENDENT_AMBULATORY_CARE_PROVIDER_SITE_OTHER): Payer: 59 | Admitting: Psychiatry

## 2018-10-14 ENCOUNTER — Encounter: Payer: Self-pay | Admitting: Psychiatry

## 2018-10-14 DIAGNOSIS — F4321 Adjustment disorder with depressed mood: Secondary | ICD-10-CM

## 2018-10-14 NOTE — Progress Notes (Signed)
      [facility] Counselor/Therapist Progress Note  Patient ID: Robert [name], MRN: [id number],    Date: 10/14/2018  Time Spent: 50 minutes   Treatment Type: Individual Therapy  Reported Symptoms: irritable, sad.  Mental Status Exam:  Appearance:   Casual     Behavior:  Appropriate  Motor:  Normal  Speech/Language:   Clear and Coherent  Affect:  Appropriate  Mood:  irritable and sad  Thought process:  normal  Thought content:    WNL  Sensory/Perceptual disturbances:    WNL  Orientation:  oriented to person, place, time/date and situation  Attention:  Good  Concentration:  Good  Memory:  WNL  Fund of knowledge:   Good  Insight:    Good  Judgment:   Good  Impulse Control:  Good   Risk Assessment: Danger to Self:  No Self-injurious Behavior: No Danger to Others: No Duty to Warn:no Physical Aggression / Violence:No  Access to Firearms a concern: No  Gang Involvement:No   Subjective: The client states that he is ambivalent about staying in his job.  It is 1 week until he presents the PowerPoint on a value proposition of his battery idea.  He is frustrated that they have been willing to spend $750,000 on the development of his idea.  Now it has been put on hold and the client feels that he is just treading water. 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  He notes that he is overlooked or marginalized at times within his company.  He has looked at his financial situation and sees that he could quit his job with no financial repercussions.  He Shaquitta Burbridge want to consider consulting as an option.  He Nashley Cordoba also try to take his idea and develop it independently of analog devices. The client clearly feels very ambivalent about whether or not to stay at his job.  I used eye-movement with the client focusing on this ambivalence.  He quickly came to the conclusion that this was probably not the right place for  him.  He will talk to his boss sooner rather than later in making his decision.  His subjective units of distress went from a 6+ to less than 1.  Interventions: Assertiveness/Communication, Motivational Interviewing, Solution-Oriented/Positive Psychology, [employer]ensit[employer]rocessing (EMDR) and Insight-Oriented  Diagnosis:   ICD-10-CM   1. Adjustment disorder with depressed mood  F43.21     Plan: Talk to his boss, turned in his resignation letter, exercise, self-care, assertiveness, boundaries.  This record has been created using [employer].  Chart creation errors have been sought, but Delphina Schum not always have been located and corrected. Such creation errors do not reflect on the standard of medical care.  Brek Reece, [facility]

## 2018-10-15 ENCOUNTER — Telehealth: Payer: Self-pay

## 2018-10-15 NOTE — Telephone Encounter (Signed)

## 2018-10-23 ENCOUNTER — Other Ambulatory Visit: Payer: Self-pay

## 2018-10-23 ENCOUNTER — Ambulatory Visit (INDEPENDENT_AMBULATORY_CARE_PROVIDER_SITE_OTHER): Payer: 59 | Admitting: Pharmacist Clinician (PhC)/ Clinical Pharmacy Specialist

## 2018-10-23 DIAGNOSIS — Z8673 Personal history of transient ischemic attack (TIA), and cerebral infarction without residual deficits: Secondary | ICD-10-CM | POA: Diagnosis not present

## 2018-10-23 DIAGNOSIS — Z952 Presence of prosthetic heart valve: Secondary | ICD-10-CM | POA: Diagnosis not present

## 2018-10-23 DIAGNOSIS — Z7901 Long term (current) use of anticoagulants: Secondary | ICD-10-CM

## 2018-10-23 LAB — POCT INR: INR: 2.6 (ref 2.0–3.0)

## 2018-10-23 MED ORDER — ENOXAPARIN SODIUM 80 MG/0.8ML ~~LOC~~ SOLN: 80.0000 mg | Freq: Two times a day (BID) | SUBCUTANEOUS | 0 refills | Status: DC

## 2018-10-23 NOTE — Patient Instructions (Addendum)
:   Last dose of Coumadin.  July 30: No Coumadin or Lovenox.  July 31: Inject Lovenox 80mg  in the fatty abdominal tissue at least 2 inches from the belly button twice a day about 12 hours apart, 8am and 8pm rotate sites. No Coumadin.  Aug 1: Inject Lovenox in the fatty tissue every 12 hours, 8am and 8pm. No Coumadin.  Aug 2: Inject Lovenox in the fatty tissue in the morning at 8 am (No PM dose). No Coumadin.  Aug 3: Procedure Day - No Lovenox - Resume Coumadin in the evening or as directed by doctor. Take 7.5 mg  Aug 4: Resume Lovenox inject in the fatty tissue every 12 hours and take Coumadin 10 mg  Aug 5: Inject Lovenox in the fatty tissue every 12 hours and take Coumadin 10 mg  Aug 6: Inject Lovenox in the fatty tissue every 12 hours and take Coumadin 7.5 mg.  Aug 7: Inject Lovenox in the fatty tissue every 12 hours and take Coumadin 7.5 mg.  Aug 8: Inject Lovenox in the fatty tissue every 12 hours and take Coumadin 7.5 mg  Aug 9: No Lovenox, coumadin 7.5 mg  Return for INR on Aug 10

## 2018-10-31 ENCOUNTER — Ambulatory Visit (INDEPENDENT_AMBULATORY_CARE_PROVIDER_SITE_OTHER): Payer: 59 | Admitting: Psychiatry

## 2018-10-31 ENCOUNTER — Encounter: Payer: Self-pay | Admitting: Psychiatry

## 2018-10-31 ENCOUNTER — Other Ambulatory Visit: Payer: Self-pay

## 2018-10-31 DIAGNOSIS — F4321 Adjustment disorder with depressed mood: Secondary | ICD-10-CM

## 2018-10-31 NOTE — Progress Notes (Signed)
[facility] Counselor/Therapist Progress Note  Patient ID: Robert [name], MRN: [id number],    Date: 10/31/2018  Time Spent: 50 minutes   Treatment Type: Individual Therapy  Reported Symptoms: 50 minutes  Mental Status Exam:  Appearance:   Casual     Behavior:  Appropriate  Motor:  Normal  Speech/Language:   Clear and Coherent  Affect:  Appropriate  Mood:  irritable  Thought process:  normal  Thought content:    WNL  Sensory/Perceptual disturbances:    WNL  Orientation:  oriented to person, place, time/date and situation  Attention:  Good  Concentration:  Good  Memory:  WNL  Fund of knowledge:   Good  Insight:    Good  Judgment:   Good  Impulse Control:  Good   Risk Assessment: Danger to Self:  No Self-injurious Behavior: No Danger to Others: No Duty to Warn:no Physical Aggression / Violence:No  Access to Firearms a concern: No  Gang Involvement:No   Subjective: Telehealth visit -- I connected with this patient by an approved telecommunication method (video), with his informed consent, and verifying identity and patient privacy.  I was located at my office and patient at his home.  As needed, we discussed the limitations, risks, and security and privacy concerns associated with telehealth service, including the availability and conditions which currently govern in-person appointments and the possibility that 3rd-party payment Layani Foronda not be fully guaranteed and he Jacky Dross be responsible for charges.  After he indicated understanding, we proceeded with the session.  Also discussed treatment planning, as needed, including ongoing verbal agreement with the plan, the opportunity to ask and answer all questions, his demonstrated understanding of instructions, and his readiness to call the office should symptoms worsen or he feels he is in a crisis state and needs more immediate and tangible assistance. 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  Between  recovering from his surgery and work he has been having a tough time. The client was ready to turn in his resignation at last session.  One of the Nurse, learning disability has made contact with him.  When they heard what his project was about, the manager felt it would make more money than any other program they have.  This person has been willing to go to bat for the client.  He is still working on his value proposition presentation for this coming Tuesday.  His idea to essentially have a container that can hold electricity that is more efficient than the battery.  The client went through all the difficulty he has had with upper management concerning this project.  People at his level or below seem to understand what he is doing and are supportive.  The client is especially frustrated and that he has not received any just compensation over the last 10 years. The client will make his presentation on Tuesday and from that make his decision on whether or not to stay with the company. The client and his wife seem to be reconciling better.  She spent time over at his house for the first time in a year.  They seem to have better conversations but the client is unsure if they are actually "working" on their marriage.    Interventions: Assertiveness/Communication, Solution-Oriented/Positive Psychology and Insight-Oriented  Diagnosis:No diagnosis found.  Plan: Boundaries, assertiveness, self-care, positive self talk, radical acceptance.  This record has been created using [employer].  Chart creation errors have been sought, but Zacharey Jensen not  always have been located and corrected. Such creation errors do not reflect on the standard of medical care.   Jacynda Brunke, [facility]

## 2018-11-03 ENCOUNTER — Other Ambulatory Visit: Payer: Self-pay

## 2018-11-03 ENCOUNTER — Ambulatory Visit (INDEPENDENT_AMBULATORY_CARE_PROVIDER_SITE_OTHER): Payer: 59 | Admitting: Pharmacist

## 2018-11-03 DIAGNOSIS — Z7901 Long term (current) use of anticoagulants: Secondary | ICD-10-CM | POA: Diagnosis not present

## 2018-11-03 DIAGNOSIS — Z952 Presence of prosthetic heart valve: Secondary | ICD-10-CM | POA: Diagnosis not present

## 2018-11-03 DIAGNOSIS — Z8673 Personal history of transient ischemic attack (TIA), and cerebral infarction without residual deficits: Secondary | ICD-10-CM

## 2018-11-03 LAB — POCT INR: INR: 2.8 (ref 2.0–3.0)

## 2018-11-12 ENCOUNTER — Ambulatory Visit (INDEPENDENT_AMBULATORY_CARE_PROVIDER_SITE_OTHER): Payer: 59 | Admitting: Psychiatry

## 2018-11-12 ENCOUNTER — Other Ambulatory Visit: Payer: Self-pay

## 2018-11-12 ENCOUNTER — Encounter: Payer: Self-pay | Admitting: Psychiatry

## 2018-11-12 DIAGNOSIS — F4321 Adjustment disorder with depressed mood: Secondary | ICD-10-CM | POA: Diagnosis not present

## 2018-11-12 NOTE — Progress Notes (Signed)
      [facility] Counselor/Therapist Progress Note  Patient ID: Robert [name], MRN: [id number],    Date: 11/12/2018  Time Spent: 50 minutes   Treatment Type: Individual Therapy  Reported Symptoms: irritable  Mental Status Exam:  Appearance:   Casual     Behavior:  Appropriate  Motor:  Normal  Speech/Language:   Clear and Coherent  Affect:  Appropriate  Mood:  irritable  Thought process:  normal  Thought content:    WNL  Sensory/Perceptual disturbances:    WNL  Orientation:  oriented to person, place, time/date and situation  Attention:  Good  Concentration:  Good  Memory:  WNL  Fund of knowledge:   Good  Insight:    Good  Judgment:   Good  Impulse Control:  Good   Risk Assessment: Danger to Self:  No Self-injurious Behavior: No Danger to Others: No Duty to Warn:no Physical Aggression / Violence:No  Access to Firearms a concern: No  Gang Involvement:No   Subjective: The client stated that he did his "value proposition" to upper level management at [employer] he works for.  He stated it went "magnificent."  He was told that he would have an answer by this past Monday but nothing has been decided.  The client has been more irritable concerning the managements actions or lack thereof, about his project.  He did say that he sent some emails to the bigger players that in retrospect were probably not helpful. The client will continue to talk with the business section of the company who is very interested in his project.  He is hopeful that since they see the value it will move the dial towards him getting the funding he needs to complete his prototype.  He states the last thing he has to develop is the firmware for the product. The client also continues to meet with his wife.  He had taken the whole family including his sons out to dinner on Monday.  Again, he does not feel like it is giving him much traction in reconciling with his wife.  He knows that she understands  what he says but will not follow through in doing it.  This specifically has to do with parenting the children.  The client will continue to be patient going forward.  Interventions: Assertiveness/Communication, Motivational Interviewing, Solution-Oriented/Positive Psychology and Insight-Oriented  Diagnosis:   ICD-10-CM   1. Adjustment disorder with depressed mood  F43.21     Plan: boundaries, assertiveness, self care, acceptance.  Merlina Marchena, [facility]
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
## 2018-12-03 ENCOUNTER — Ambulatory Visit (INDEPENDENT_AMBULATORY_CARE_PROVIDER_SITE_OTHER): Payer: 59 | Admitting: Psychiatry

## 2018-12-03 ENCOUNTER — Other Ambulatory Visit: Payer: Self-pay

## 2018-12-03 ENCOUNTER — Encounter: Payer: Self-pay | Admitting: Psychiatry

## 2018-12-03 DIAGNOSIS — F4321 Adjustment disorder with depressed mood: Secondary | ICD-10-CM | POA: Diagnosis not present

## 2018-12-03 IMAGING — CT CT PELVIS W/ CM
2 of 4 series · 17 of 46 positions shown, 19 images · IV contrast (iopamidol)
Comparison: None.

CLINICAL DATA: Right groin pain, erythema superior to the mid
thighs since [REDACTED]. Evaluate for hematoma.

EXAM:
CT PELVIS WITH CONTRAST
TECHNIQUE: Multidetector CT imaging of the pelvis was performed using the
standard protocol following the bolus administration of intravenous
contrast.
CONTRAST:  100mL 1785DU-YUU IOPAMIDOL (1785DU-YUU) INJECTION 61%

[Series 2: axial bone · axial · 0.93mm/px · z∈[-454,-78]mm · 14 of 216 slices shown, 16 images]
[im 14/216  soft-tissue]
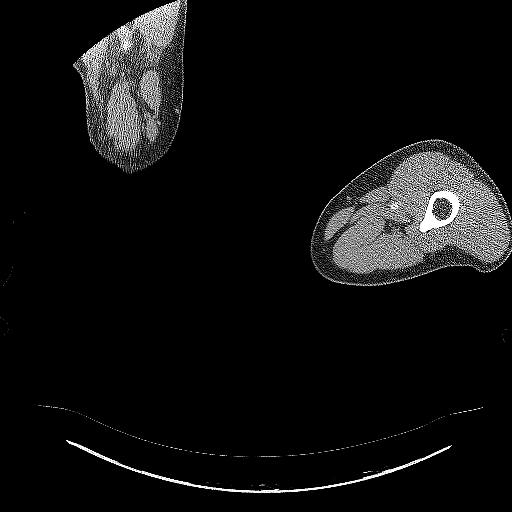
[im 14/216  bone]
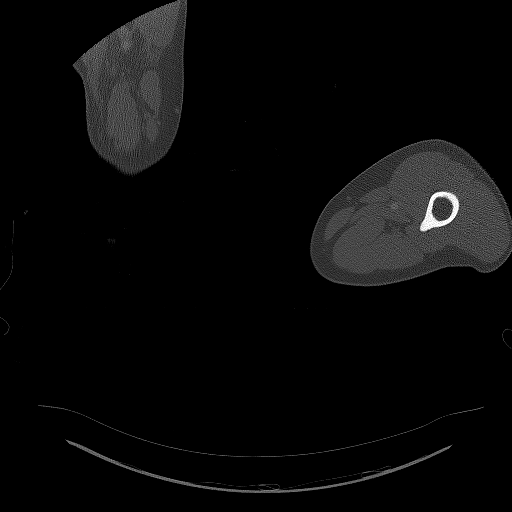
[im 28/216  soft-tissue]
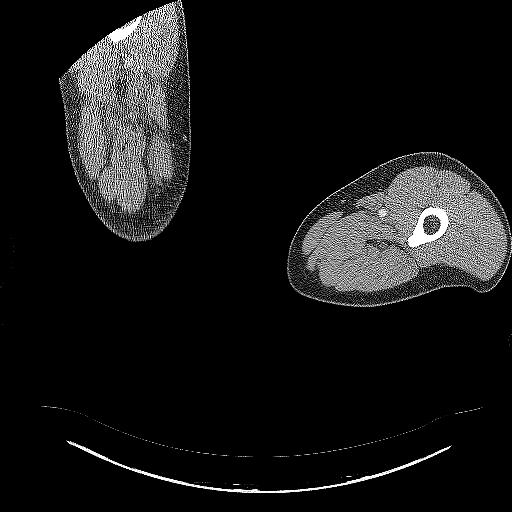
[im 42/216  soft-tissue]
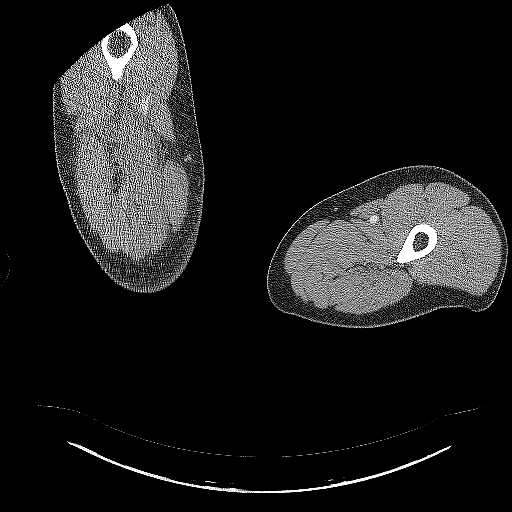
[im 56/216  soft-tissue]
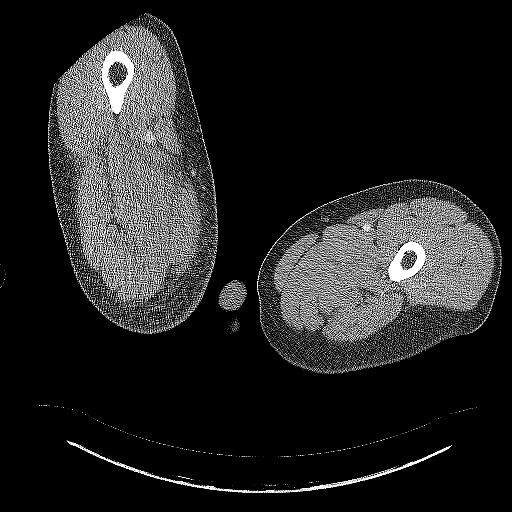
[im 70/216  soft-tissue]
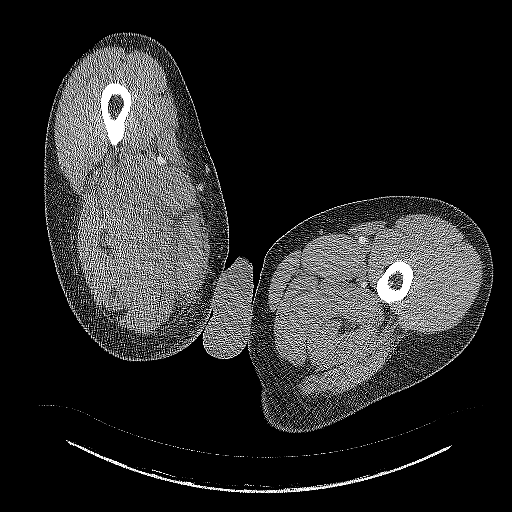
[im 84/216  soft-tissue]
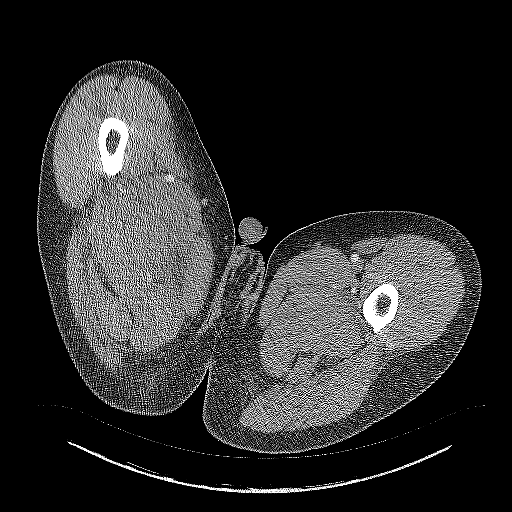
[im 98/216  soft-tissue]
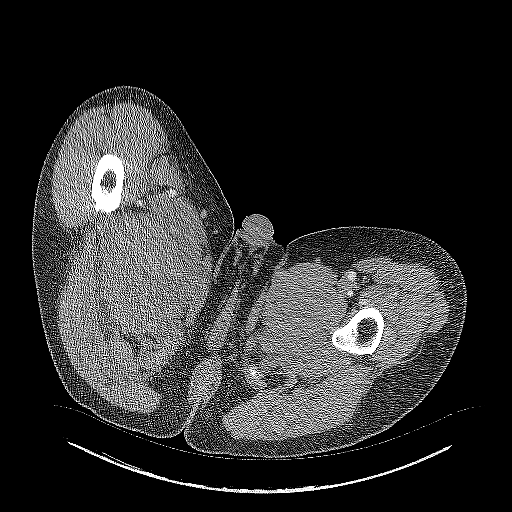
[im 118/216  soft-tissue]
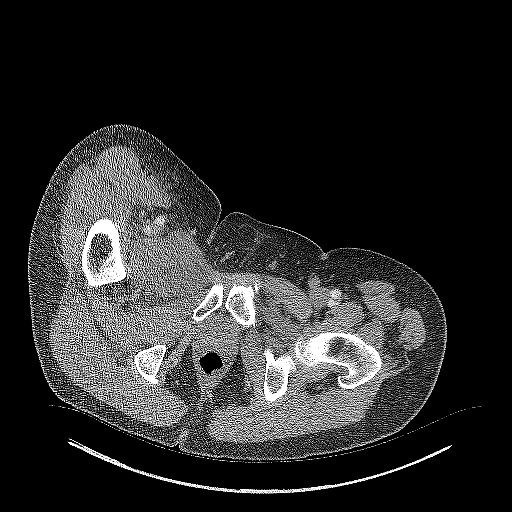
[im 132/216  soft-tissue]
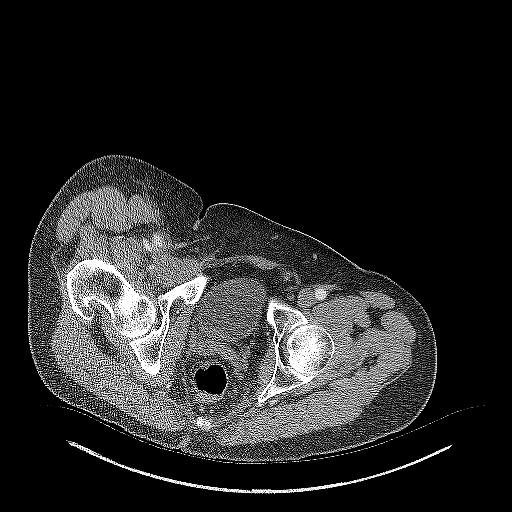
[im 132/216  bone]
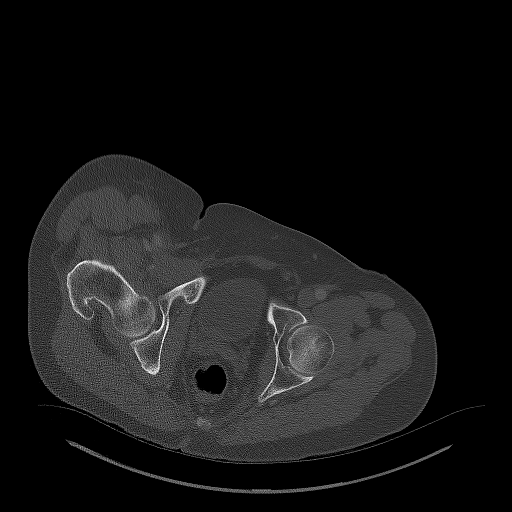
[im 146/216  soft-tissue]
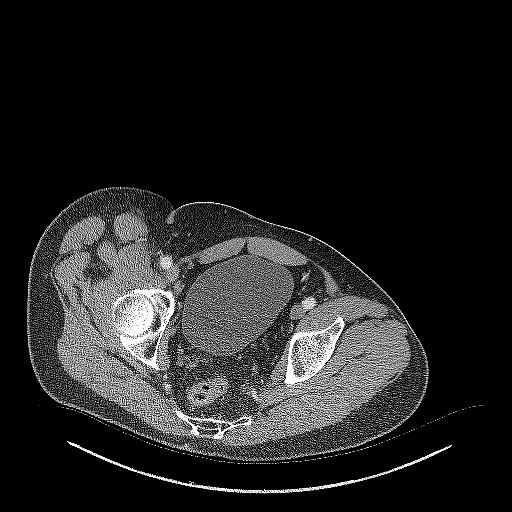
[im 160/216  soft-tissue]
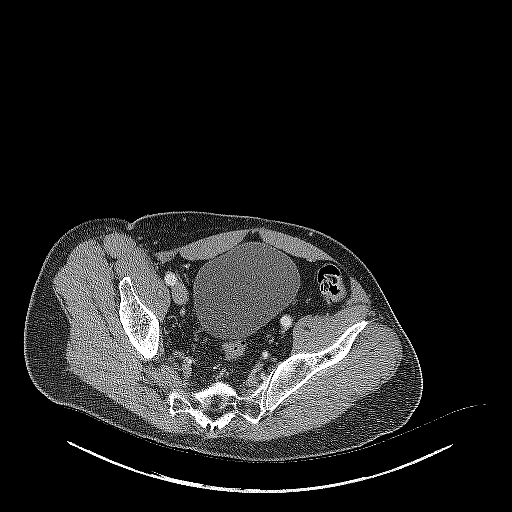
[im 174/216  soft-tissue]
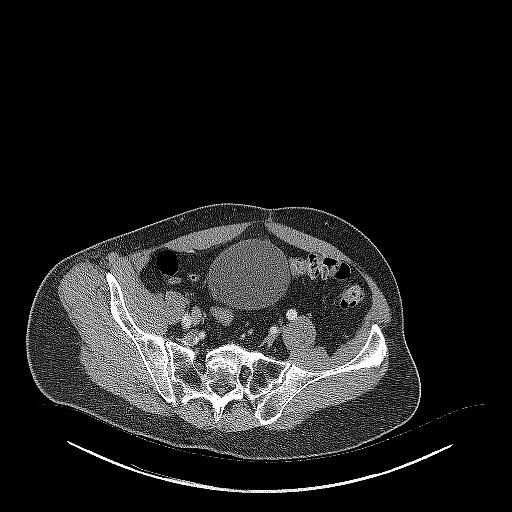
[im 188/216  soft-tissue]
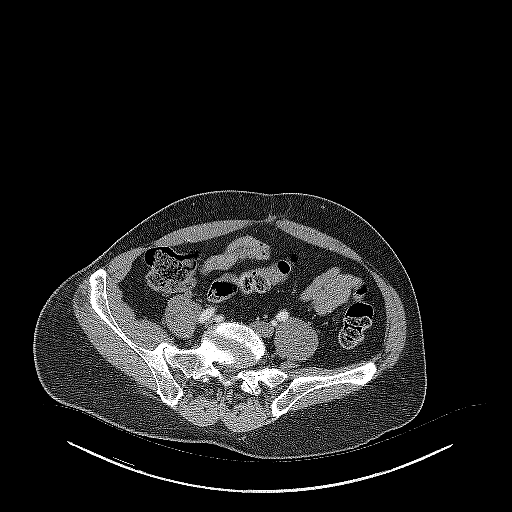
[im 202/216  soft-tissue]
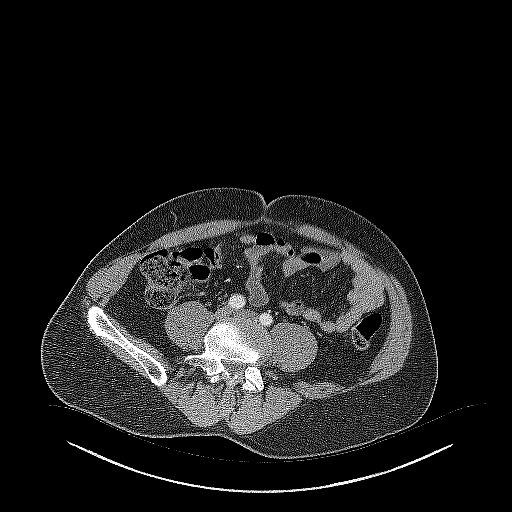

[Series 5: coronal st · coronal · 1.09mm/px · 3 of 192 slices shown]
[im 64/192  soft-tissue]
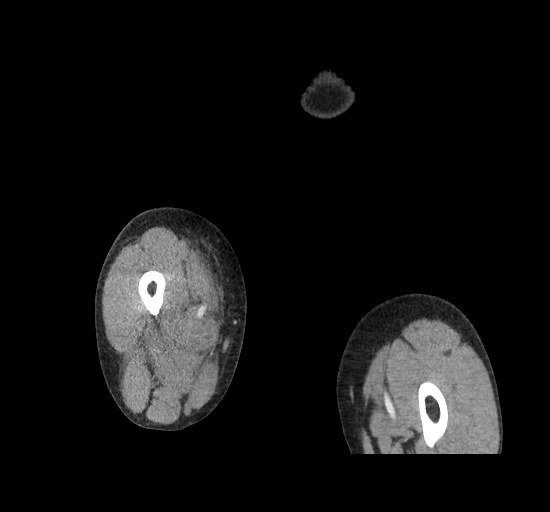
[im 96/192  soft-tissue]
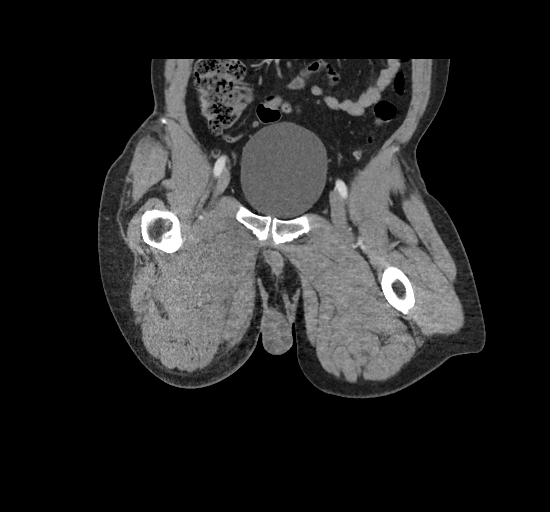
[im 128/192  soft-tissue]
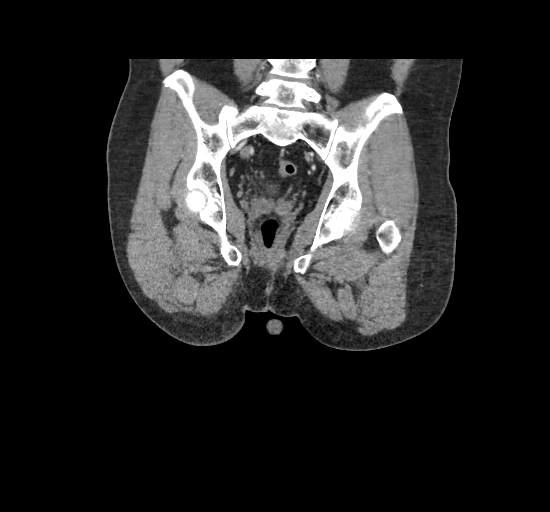

[17 of 46 positions shown; findings below may reference images not displayed]

FINDINGS: Urinary Tract:  No abnormality visualized.

Bowel:  Unremarkable visualized pelvic bowel loops.

Vascular/Lymphatic: No pathologically enlarged lymph nodes. No
significant vascular abnormality seen.

Reproductive:  No mass or other significant abnormality

Other:  None.

Musculoskeletal: Edematous appearance of the adductor muscles of the
right thigh with subtle area of intra muscular hyperdensity
consistent with intramuscular strain and slight hemorrhage, series
5, image 117. The bony pelvis, included lumbar spine, hip joints and
proximal femora appear intact without acute nor suspicious osseous
abnormalities.
IMPRESSION: 1. Findings consistent with moderate right-sided adductor muscle
strain with subtle intramuscular hemorrhage and edema noted within.
2. No acute nor suspicious osseous abnormality.

## 2018-12-03 NOTE — Progress Notes (Signed)
      [facility] Counselor/Therapist Progress Note  Patient ID: Robert [name], MRN: [id number],    Date: 12/03/2018  Time Spent: 50 minutes   Treatment Type: Individual Therapy  Reported Symptoms: sad  Mental Status Exam:  Appearance:   Casual     Behavior:  Appropriate  Motor:  Normal  Speech/Language:   Clear and Coherent  Affect:  Appropriate  Mood:  sad  Thought process:  normal  Thought content:    WNL  Sensory/Perceptual disturbances:    WNL  Orientation:  oriented to person, place, time/date and situation  Attention:  Good  Concentration:  Good  Memory:  WNL  Fund of knowledge:   Good  Insight:    Good  Judgment:   Good  Impulse Control:  Good   Risk Assessment: Danger to Self:  No Self-injurious Behavior: No Danger to Others: No Duty to Warn:no Physical Aggression / Violence:No  Access to Firearms a concern: No  Gang Involvement:No   Subjective: The client was let go from his job this past week.  He is not working for [employer] anymore.  He does have some other options. The client detailed his experience with HR.  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  He would like to be able to take them with him and believes legally that he can.  We discussed the best way to approach that so that he does not incur a lawsuit.  I encouraged the client to continue to consult with intellectual property lawyer which he agrees to do. The client is disappointed that his stepson has not approached him about payment for his school semester.  The client has a 529 set up for his stepchildren.  He just requires that they contact him a week or so before things are due so he can have a chat and not be rushed with all the paperwork.  His stepson did not follow through on that.  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  The stepson agreed to and then never followed through.  I pointed  out to the client that this is the same dynamic that he experiences with his wife.  She says 1 thing but that ends up doing something else.  He agreed and will consider that.  Interventions: Assertiveness/Communication, Motivational Interviewing, Solution-Oriented/Positive Psychology and Insight-Oriented  Diagnosis:   ICD-10-CM   1. Adjustment disorder with depressed mood  F43.21     Plan: Self-care, assertiveness, boundaries.  Margie Urbanowicz, [facility]

## 2018-12-15 ENCOUNTER — Ambulatory Visit (INDEPENDENT_AMBULATORY_CARE_PROVIDER_SITE_OTHER): Payer: 59 | Admitting: Pharmacist Clinician (PhC)/ Clinical Pharmacy Specialist

## 2018-12-15 ENCOUNTER — Other Ambulatory Visit: Payer: Self-pay

## 2018-12-15 DIAGNOSIS — Z8673 Personal history of transient ischemic attack (TIA), and cerebral infarction without residual deficits: Secondary | ICD-10-CM

## 2018-12-15 DIAGNOSIS — Z952 Presence of prosthetic heart valve: Secondary | ICD-10-CM | POA: Diagnosis not present

## 2018-12-15 DIAGNOSIS — Z7901 Long term (current) use of anticoagulants: Secondary | ICD-10-CM

## 2018-12-15 LAB — POCT INR: INR: 2.2 (ref 2.0–3.0)

## 2018-12-15 NOTE — Patient Instructions (Signed)
Take 1.5 tablets today Monday Sept 21, then resume 1 and 1/2 tablets daily except for 1 tablet every Monday and Friday. Repeat INR in 6 weeks * Call clinic if new medication, antibiotic or procedure*

## 2018-12-16 ENCOUNTER — Other Ambulatory Visit: Payer: Self-pay | Admitting: Cardiology

## 2018-12-17 ENCOUNTER — Ambulatory Visit (INDEPENDENT_AMBULATORY_CARE_PROVIDER_SITE_OTHER): Payer: 59 | Admitting: Psychiatry

## 2018-12-17 ENCOUNTER — Encounter: Payer: Self-pay | Admitting: Psychiatry

## 2018-12-17 ENCOUNTER — Other Ambulatory Visit: Payer: Self-pay

## 2018-12-17 DIAGNOSIS — F4321 Adjustment disorder with depressed mood: Secondary | ICD-10-CM

## 2018-12-17 NOTE — Progress Notes (Signed)
[facility] Counselor/Therapist Progress Note  Patient ID: Robert [name], MRN: [id number],    Date: 12/17/2018  Time Spent: 50 minutes   Treatment Type: Individual Therapy  Reported Symptoms: sad, rejected, tearful.  Mental Status Exam:  Appearance:   Casual     Behavior:  Appropriate  Motor:  Normal  Speech/Language:   Clear and Coherent  Affect:  Tearful  Mood:  sad  Thought process:  normal  Thought content:    WNL  Sensory/Perceptual disturbances:    WNL  Orientation:  oriented to person, place, time/date and situation  Attention:  Good  Concentration:  Good  Memory:  WNL  Fund of knowledge:   Good  Insight:    Good  Judgment:   Good  Impulse Control:  Good   Risk Assessment: Danger to Self:  No Self-injurious Behavior: No Danger to Others: No Duty to Warn:no Physical Aggression / Violence:No  Access to Firearms a concern: No  Gang Involvement:No   Subjective: The client reports that he has had a hard week.  He had reached out to the Nurse, learning disability at [employer] to see if they would pursue his project or not.  The manager ultimately did not return the client's phone call and when he did get a hold of her she said she needed to talk to legal first. The client contacted 3 other individuals that he felt he could collaborate with on his project.  All of them expressed a lot of uncertainty on working with him on his idea.  His project was much further along than theirs was but they were uninterested. The client's oldest son typically comes over to the client's house for Wednesday night dinner.  He did not this past Wednesday which surprised the client.  He texted his son a number of times with no response.  He asked his other sons if the oldest had a beef with him or not?  They did not know.  They were slated to be at their mother's house over the weekend and he asked if they would let him know if he was there.  They agreed to.  He still has not heard from his  son. All of this has contributed to the client's sad mood.  I used eye-movement with the client focusing on his mood.  His negative cognition is, "I am rejected".  He feels it in his face and his back and his chest.  His subjective units of distress is a 7.  As the client processed he noticed how tight and twitchy his face was.  As we continue to process using eye-movement he noticed a decrease in his stress level.  The twitching stopped in his face.  Then he noticed that his back relaxed.  As we came to the end of the session the client noticed that his sadness had decreased quite a bit.  I used the bilateral stimulation hand paddles with the client as he used a visualization inviting Jesus into his disappointment.  He realized that, "this is how people are."  When he finished he said he felt a lot of relief.  He had more acceptance that things were the way they were.  We discussed how he could put his efforts and energy into a new project.  He had some ideas which he will pursue.  Interventions: Assertiveness/Communication, Motivational Interviewing, Solution-Oriented/Positive Psychology, [employer] Desensitization and Reprocessing (EMDR) and Insight-Oriented  Diagnosis:   ICD-10-CM   1. Adjustment disorder with  depressed mood  F43.21     Plan: Boundaries, assertiveness, self-care, exercise, positive self talk.  Hadiya Spoerl, [facility]

## 2018-12-31 ENCOUNTER — Ambulatory Visit (INDEPENDENT_AMBULATORY_CARE_PROVIDER_SITE_OTHER): Payer: 59 | Admitting: Psychiatry

## 2018-12-31 ENCOUNTER — Encounter: Payer: Self-pay | Admitting: Psychiatry

## 2018-12-31 ENCOUNTER — Other Ambulatory Visit: Payer: Self-pay

## 2018-12-31 DIAGNOSIS — F4321 Adjustment disorder with depressed mood: Secondary | ICD-10-CM | POA: Diagnosis not present

## 2018-12-31 NOTE — Progress Notes (Signed)
      [facility] Counselor/Therapist Progress Note  Patient ID: Robert [name], MRN: [id number],    Date: 12/31/2018  Time Spent: 50 minutes   Treatment Type: Individual Therapy  Reported Symptoms: sad  Mental Status Exam:  Appearance:   Casual     Behavior:  Appropriate  Motor:  Normal  Speech/Language:   Clear and Coherent  Affect:  Appropriate  Mood:  sad  Thought process:  normal  Thought content:    WNL  Sensory/Perceptual disturbances:    WNL  Orientation:  oriented to person, place, time/date and situation  Attention:  Good  Concentration:  Good  Memory:  WNL  Fund of knowledge:   Good  Insight:    Good  Judgment:   Good  Impulse Control:  Good   Risk Assessment: Danger to Self:  No Self-injurious Behavior: No Danger to Others: No Duty to Warn:no Physical Aggression / Violence:No  Access to Firearms a concern: No  Gang Involvement:No   Subjective: Since the client has been laid off from his job he has had a hard time focusing.  "I feel terrible."  With his work he feels hopeless.  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  He is coming in with a list of what he wants.  They have asked him to write it up by tomorrow.  "It feels hopeless.  There are lots of hurdles.  They have crushed my spirit." I used eye-movement with the client around how he feels professionally and also how he feels in his marriage has gone  His negative cognition is, "I feel hopeless". He feels sadness in his chest.  His subjective units of distress is a 7.  As the client processed he became much more sad.  He is upset that his wife will not commit to coparent with him.  His work will also not cooperate with him.  He feels that all his efforts at being authentic and transparent are dismissed.  He is so disappointed that his wife will not put him before her own children.  This is been a major sticking point in their relationship.  As the  client processed he realized that many times this is the way that people are.  They Robert [name] refuse to change but he will try to do the best he can.  The client subjective units of distress at the end of the session was less than 3.  Interventions: Assertiveness/Communication, Motivational Interviewing, Solution-Oriented/Positive Psychology, [employer] Desensitization and Reprocessing (EMDR) and Insight-Oriented  Diagnosis:   ICD-10-CM   1. Adjustment disorder with depressed mood  F43.21     Plan: Boundaries, assertiveness, self-care, exercise.  Beda Dula, [facility]

## 2019-01-12 ENCOUNTER — Other Ambulatory Visit: Payer: Self-pay

## 2019-01-12 ENCOUNTER — Ambulatory Visit (INDEPENDENT_AMBULATORY_CARE_PROVIDER_SITE_OTHER): Payer: 59 | Admitting: Pharmacist Clinician (PhC)/ Clinical Pharmacy Specialist

## 2019-01-12 DIAGNOSIS — Z8673 Personal history of transient ischemic attack (TIA), and cerebral infarction without residual deficits: Secondary | ICD-10-CM

## 2019-01-12 DIAGNOSIS — Z7901 Long term (current) use of anticoagulants: Secondary | ICD-10-CM | POA: Diagnosis not present

## 2019-01-12 DIAGNOSIS — Z952 Presence of prosthetic heart valve: Secondary | ICD-10-CM

## 2019-01-12 LAB — POCT INR: INR: 3 (ref 2.0–3.0)

## 2019-01-12 NOTE — Patient Instructions (Signed)
Continue with 1 and 1/2 tablets daily except for 1 tablet every Monday and Friday. Repeat INR in 6 weeks * Call clinic if new medication, antibiotic or procedure*

## 2019-01-14 ENCOUNTER — Other Ambulatory Visit: Payer: Self-pay

## 2019-01-14 ENCOUNTER — Encounter: Payer: Self-pay | Admitting: Psychiatry

## 2019-01-14 ENCOUNTER — Ambulatory Visit (INDEPENDENT_AMBULATORY_CARE_PROVIDER_SITE_OTHER): Payer: 59 | Admitting: Psychiatry

## 2019-01-14 DIAGNOSIS — F4321 Adjustment disorder with depressed mood: Secondary | ICD-10-CM | POA: Diagnosis not present

## 2019-01-14 NOTE — Progress Notes (Signed)
      [facility] Counselor/Therapist Progress Note  Patient ID: Robert [name], MRN: [id number],    Date: 01/14/2019  Time Spent: 50 minutes   Treatment Type: Individual Therapy  Reported Symptoms: sad  Mental Status Exam:  Appearance:   Casual     Behavior:  Appropriate  Motor:  Normal  Speech/Language:   Clear and Coherent  Affect:  Appropriate  Mood:  sad  Thought process:  normal  Thought content:    WNL  Sensory/Perceptual disturbances:    WNL  Orientation:  oriented to person, place, time/date and situation  Attention:  Good  Concentration:  Good  Memory:  WNL  Fund of knowledge:   Good  Insight:    Good  Judgment:   Good  Impulse Control:  Good   Risk Assessment: Danger to Self:  No Self-injurious Behavior: No Danger to Others: No Duty to Warn:no Physical Aggression / Violence:No  Access to Firearms a concern: No  Gang Involvement:No   Subjective: The client states that he feels very sad today.  It is his brother's birthday with whom he was able to speak which he said lightened his mood some.  He had spent the night with his wife who he is still separated from him.  They live separately but not as a married couple more as boyfriend and girlfriend.  The client acknowledges this, which is part of his sadness.  He understands that he and his wife do not necessarily have the same parenting strategies or even the same worldview.  This results in conflict especially over her children.  For those of her 5 sons that want it, the client is paying for college.  He has a very low standard of what he wants from them.  None of his wife's children have really done what they have said they were going to do.  He requested a meeting before the semester starts to look at previous grades and to look at what they are signing up for.  He states he has no desire to tell them what they can or cannot take but he just wants involvement.  His stepson's do not reject him out right but do it in a  passive-aggressive manner.  This has also caused him a lot of sadness. The client has reached a settlement with his previous company who has agreed to a large payout which the client finds acceptable.  He is frustrated that they made things as difficult as they did.  Today I used the bilateral stimulation hand paddles with the client as he thought about his sadness.  He noted that his subjective units of distress went from a 6+ to less than 3 at the end of the session.  It was much lighter in his mood at the end.  Interventions: Motivational Interviewing, Solution-Oriented/Positive Psychology, [employer] Desensitization and Reprocessing (EMDR) and Insight-Oriented  Diagnosis:   ICD-10-CM   1. Adjustment disorder with depressed mood  F43.21     Plan: Boundaries, positive self talk, self care, assertiveness, radical acceptance.  Mc Hollen, [facility]

## 2019-01-28 ENCOUNTER — Encounter: Payer: Self-pay | Admitting: Psychiatry

## 2019-01-28 ENCOUNTER — Ambulatory Visit (INDEPENDENT_AMBULATORY_CARE_PROVIDER_SITE_OTHER): Payer: 59 | Admitting: Psychiatry

## 2019-01-28 ENCOUNTER — Other Ambulatory Visit: Payer: Self-pay

## 2019-01-28 DIAGNOSIS — F4321 Adjustment disorder with depressed mood: Secondary | ICD-10-CM | POA: Diagnosis not present

## 2019-01-28 NOTE — Progress Notes (Signed)
      [facility] Counselor/Therapist Progress Note  Patient ID: Robert [name], MRN: [id number],    Date: 01/28/2019  Time Spent: 50 minutes   Treatment Type: Individual Therapy  Reported Symptoms: sad, disappointed  Mental Status Exam:  Appearance:   Casual     Behavior:  Appropriate  Motor:  Normal  Speech/Language:   Clear and Coherent  Affect:  Appropriate  Mood:  sad  Thought process:  normal  Thought content:    WNL  Sensory/Perceptual disturbances:    WNL  Orientation:  oriented to person, place, time/date and situation  Attention:  Good  Concentration:  Good  Memory:  WNL  Fund of knowledge:   Good  Insight:    Good  Judgment:   Good  Impulse Control:  Good   Risk Assessment: Danger to Self:  No Self-injurious Behavior: No Danger to Others: No Duty to Warn:no Physical Aggression / Violence:No  Access to Firearms a concern: No  Gang Involvement:No   Subjective: The client has been disappointed with the way the election has been handled.  With the counts being delayed he finds himself being very frustrated.   He continues to engage his wife even though they live separately.  His wife works as a Animal nutritionist and hopes to expand her business by moving into the adjacent space [place] the [place].  She would like to use a small part for her practice and the rest as a dog wash business.  One of the clients stepsons stated he would write up a business plan to present to the client.  They would need an investment of $70,000 which the client has.  It has been 2 months and there has been no business plan.  He recently received a list of materials that would be needed but no other plan connected to it. He spoke to his wife about his disappointment with her son.  She kept telling him, "it is a wise idea."  Wise or not the client will not fork out $70,000 with no business plan.  This is continued behavior from her children.  They say they will do something and then never follow  through. This is the same for his relationship with his wife.  As long as he does not ask for equal parity in the relationship things go along well.  When he asks to give input into decisions he ends up being rejected.  I discussed with the client at length how long he will let this kind of behavior continue?  He was unsure but will consider what course of action he needs to take.  Interventions: Assertiveness/Communication, Motivational Interviewing, Solution-Oriented/Positive Psychology and Insight-Oriented  Diagnosis:   ICD-10-CM   1. Adjustment disorder with depressed mood  F43.21     Plan: Boundaries, assertiveness, self-care, positive self talk.  Staysha Truby, [facility]

## 2019-02-11 ENCOUNTER — Encounter: Payer: Self-pay | Admitting: Psychiatry

## 2019-02-11 ENCOUNTER — Ambulatory Visit (INDEPENDENT_AMBULATORY_CARE_PROVIDER_SITE_OTHER): Payer: 59 | Admitting: Psychiatry

## 2019-02-11 ENCOUNTER — Other Ambulatory Visit: Payer: Self-pay

## 2019-02-11 DIAGNOSIS — F4321 Adjustment disorder with depressed mood: Secondary | ICD-10-CM | POA: Diagnosis not present

## 2019-02-11 NOTE — Progress Notes (Signed)
      [facility] Counselor/Therapist Progress Note  Patient ID: Robert [name], MRN: [id number],    Date: 02/11/2019  Time Spent: 50 minutes   Treatment Type: Individual Therapy  Reported Symptoms: sad  Mental Status Exam:  Appearance:   Casual     Behavior:  Appropriate  Motor:  Normal  Speech/Language:   Clear and Coherent  Affect:  Appropriate  Mood:  sad  Thought process:  normal  Thought content:    WNL  Sensory/Perceptual disturbances:    WNL  Orientation:  oriented to person, place, time/date and situation  Attention:  Good  Concentration:  Good  Memory:  WNL  Fund of knowledge:   Good  Insight:    Good  Judgment:   Good  Impulse Control:  Good   Risk Assessment: Danger to Self:  No Self-injurious Behavior: No Danger to Others: No Duty to Warn:no Physical Aggression / Violence:No  Access to Firearms a concern: No  Gang Involvement:No   Subjective: The client states that 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  He states he was planning on driving to [place] with his wife for his dad's 36th birthday.  There was series of events his dad had called him to disinvite him.  His dad was overwhelmed with so many people coming to the house.  He asked the client to come later in the month.  The client wants to be able to bring his wife but also needs to get her back to [place] in a reasonable time. All this highlighted a conflict that the client has had with his oldest sister.  He feels that they have not been as close as they had in the past.  He is unsure what disrupted their relationship.  He was very sad today as he discussed this.  I had the client use the bilateral stimulation hand paddles as he processed his sadness about his sister.  I suggested to the client that he make a periodic phone call.  He did not think this would be helpful.  The client Robert [name] write a note periodically which she thought could work.  Use it as a way to encourage her to  maybe rebuild the communication.  The client agreed.  Interventions: Assertiveness/Communication, Motivational Interviewing, Solution-Oriented/Positive Psychology, [employer] Desensitization and Reprocessing (EMDR) and Insight-Oriented  Diagnosis:   ICD-10-CM   1. Adjustment disorder with depressed mood  F43.21     Plan: Notes to his sister, assertiveness, boundaries, self-care, positive self talk.  Robert [name], [facility]

## 2019-02-23 ENCOUNTER — Other Ambulatory Visit: Payer: Self-pay

## 2019-02-23 ENCOUNTER — Ambulatory Visit (INDEPENDENT_AMBULATORY_CARE_PROVIDER_SITE_OTHER): Payer: 59 | Admitting: Pharmacist Clinician (PhC)/ Clinical Pharmacy Specialist

## 2019-02-23 DIAGNOSIS — Z7901 Long term (current) use of anticoagulants: Secondary | ICD-10-CM

## 2019-02-23 DIAGNOSIS — Z8673 Personal history of transient ischemic attack (TIA), and cerebral infarction without residual deficits: Secondary | ICD-10-CM | POA: Diagnosis not present

## 2019-02-23 DIAGNOSIS — Z952 Presence of prosthetic heart valve: Secondary | ICD-10-CM | POA: Diagnosis not present

## 2019-02-23 LAB — POCT INR: INR: 3 (ref 2.0–3.0)

## 2019-02-25 ENCOUNTER — Encounter: Payer: Self-pay | Admitting: Psychiatry

## 2019-02-25 ENCOUNTER — Other Ambulatory Visit: Payer: Self-pay

## 2019-02-25 ENCOUNTER — Ambulatory Visit (INDEPENDENT_AMBULATORY_CARE_PROVIDER_SITE_OTHER): Payer: 59 | Admitting: Psychiatry

## 2019-02-25 DIAGNOSIS — F4321 Adjustment disorder with depressed mood: Secondary | ICD-10-CM

## 2019-02-25 NOTE — Progress Notes (Signed)
      [facility] Counselor/Therapist Progress Note  Patient ID: LINDBURGH BLUEMEL, MRN: [id number],    Date: 02/25/2019  Time Spent: 50 minutes   Treatment Type: Individual Therapy  Reported Symptoms: sad  Mental Status Exam:  Appearance:   Casual     Behavior:  Appropriate  Motor:  Normal  Speech/Language:   Clear and Coherent  Affect:  Appropriate  Mood:  sad  Thought process:  normal  Thought content:    WNL  Sensory/Perceptual disturbances:    WNL  Orientation:  oriented to person, place, time/date and situation  Attention:  Good  Concentration:  Good  Memory:  WNL  Fund of knowledge:   Good  Insight:    Good  Judgment:   Good  Impulse Control:  Good   Risk Assessment: Danger to Self:  No Self-injurious Behavior: No Danger to Others: No Duty to Warn:no Physical Aggression / Violence:No  Access to Firearms a concern: No  Gang Involvement:No   Subjective: The client states he had a "lovely Thanksgiving" with his wife and her children.  He asked the question today "why do I do the same thing over and over?"  The client is referring to his relationship with his wife.  He keeps trying to engage her to return to their marriage.  She refuses his willingness to parent with her.  She is steeped in codependent behavior with her children.  This shuts him out. I discussed with the client that it appears he has a "fantasy trance" going on with her.  This is where he takes the parts of her behavior that he loves and enjoys and makes that what he believes their whole relationship is.  In actuality it makes up about 10% of their relationship.  The other 90% is about him getting rejected or ignored or invalidated.  He is tied up into his interpersonal relationship with his wife.  When they are together alone things go very well.  They also have a very engaged sexual relationship which the client appreciates.  Nothing else in the relationship goes well.  I asked the client to really think  about what he is trying to do and if he can accomplish this after 1-1/2 years of separation?  The client will try to address some of this on a car trip with his wife to [place] to see his dad this coming week.  Interventions: Assertiveness/Communication, Mindfulness Meditation, Motivational Interviewing, Solution-Oriented/Positive Psychology, [employer] Desensitization and Reprocessing (EMDR) and Insight-Oriented  Diagnosis:   ICD-10-CM   1. Adjustment disorder with depressed mood  F43.21     Plan: Mindfulness, assertiveness, boundaries, self-care, positive self talk.  Alexcia Schools, [facility]

## 2019-02-27 ENCOUNTER — Other Ambulatory Visit: Payer: Self-pay | Admitting: Cardiology

## 2019-02-27 ENCOUNTER — Other Ambulatory Visit: Payer: Self-pay | Admitting: Pharmacist

## 2019-02-27 MED ORDER — WARFARIN SODIUM 5 MG PO TABS: ORAL_TABLET | ORAL | 1 refills | Status: DC

## 2019-03-10 ENCOUNTER — Other Ambulatory Visit: Payer: Self-pay

## 2019-03-10 ENCOUNTER — Ambulatory Visit (INDEPENDENT_AMBULATORY_CARE_PROVIDER_SITE_OTHER): Payer: 59 | Admitting: Psychiatry

## 2019-03-10 ENCOUNTER — Encounter: Payer: Self-pay | Admitting: Psychiatry

## 2019-03-10 DIAGNOSIS — F4321 Adjustment disorder with depressed mood: Secondary | ICD-10-CM | POA: Diagnosis not present

## 2019-03-10 NOTE — Progress Notes (Signed)
      [facility] Counselor/Therapist Progress Note  Patient ID: Robert [name], MRN: [id number],    Date: 03/10/2019  Time Spent: 50 minutes   Treatment Type: Individual Therapy  Reported Symptoms: frustrated, sad  Mental Status Exam:  Appearance:   Casual     Behavior:  Appropriate  Motor:  Normal  Speech/Language:   Clear and Coherent  Affect:  Appropriate  Mood:  sad  Thought process:  normal  Thought content:    WNL  Sensory/Perceptual disturbances:    WNL  Orientation:  oriented to person, place, time/date and situation  Attention:  Good  Concentration:  Good  Memory:  WNL  Fund of knowledge:   Good  Insight:    Good  Judgment:   Good  Impulse Control:  Good   Risk Assessment: Danger to Self:  No Self-injurious Behavior: No Danger to Others: No Duty to Warn:no Physical Aggression / Violence:No  Access to Firearms a concern: No  Gang Involvement:No   Subjective: The client states he has just returned from [place] after seeing his dad.  His father is 9 years old and the client felt like this Robert [name] be one of the last times he gets to see him.  He and his wife had driven up together.  She had stayed the weekend and then flew back on Monday.  When he returned to [facility] he helped her with her office Christmas party.  He ended up spending the night with her. The client and his wife have been separated almost 2 years.  He continues to periodically sleep over at his wife's house.  When they are alone and intimate everything goes very well.  When the discussion turns to money it becomes very confused.  The client realizes today that his wife operates off of her feelings.  What feels right to her is not always the correct thing.  Last year the client sold his house from his previous marriage.  This was a house he purchased from his ex-wife before he married his current wife.  He paid the mortgage with all his own money.  When he sold the house his current wife expected half of  the proceeds.  She also expects him to pay for her children's college which he has graciously agreed to in some cases. The client has tried to talk with his wife about these issues since they seem to be the biggest sticking point between them.  We discussed breaking out each concept one by one to help his wife understand the logic of their financial circumstances.  He is not convinced that this will work but will see if he can do so.  Interventions: Assertiveness/Communication, Mindfulness Meditation, Motivational Interviewing, Solution-Oriented/Positive Psychology and Insight-Oriented  Diagnosis:   ICD-10-CM   1. Adjustment disorder with depressed mood  F43.21     Plan: Develop very clear specific talking points for wife, assertiveness, boundaries, exercise, self-care, prayerful mindfulness.  Robert [name], [facility]

## 2019-03-21 ENCOUNTER — Other Ambulatory Visit: Payer: Self-pay | Admitting: Cardiology

## 2019-03-24 ENCOUNTER — Ambulatory Visit (INDEPENDENT_AMBULATORY_CARE_PROVIDER_SITE_OTHER): Payer: 59 | Admitting: Psychiatry

## 2019-03-24 ENCOUNTER — Other Ambulatory Visit: Payer: Self-pay

## 2019-03-24 ENCOUNTER — Encounter: Payer: Self-pay | Admitting: Psychiatry

## 2019-03-24 DIAGNOSIS — F4321 Adjustment disorder with depressed mood: Secondary | ICD-10-CM

## 2019-03-24 NOTE — Progress Notes (Signed)
[facility] Counselor/Therapist Progress Note  Patient ID: Robert [name], MRN: [id number],    Date: 03/24/2019  Time Spent: 50  minutes   Treatment Type: Individual Therapy  Reported Symptoms: irritable  Mental Status Exam:  Appearance:   Casual     Behavior:  Appropriate  Motor:  Normal  Speech/Language:   Clear and Coherent  Affect:  Appropriate  Mood:  irritable  Thought process:  normal  Thought content:    WNL  Sensory/Perceptual disturbances:    WNL  Orientation:  oriented to person, place, time/date and situation  Attention:  Good  Concentration:  Good  Memory:  WNL  Fund of knowledge:   Good  Insight:    Good  Judgment:   Good  Impulse Control:  Good   Risk Assessment: Danger to Self:  No Self-injurious Behavior: No Danger to Others: No Duty to Warn:no Physical Aggression / Violence:No  Access to Firearms a concern: No  Gang Involvement:No   Subjective: The client comes in today very irritated.  He indicates that his wife who lives separately from him wants to move her horses back onto his property.  This causes the client a great amount of frustration because the horses room on the land and cause erosion.  In addition to that last week one of the goats that the client was keeping for his wife, who is a Animal nutritionist, got sick.  He asked her for 2 days to come and help.  When she finally did come they had to euthanize the goat.  Last minute his wife thought the goat Caleb Prigmore have had rabies and they would have to cut off the head and send it to [facility] to be evaluated.  The client ended up doing this but the last trip to [place] had already left.  The client ended up paying one of his wife's employees $200 to drive the severed had to the [place].  He had initially offered $100 but his wife told him he was too cheap and needed to pay her $200. He stated then his wife went on her rant at what a terrible husband he is.  They have had conflict over the  fact that he has money and she has unwisely spent all of hers.  She expects him to use his funds for her children and other things that she needs.  For the most part he has been gracious and generous.  He got very irritated today when he was told that his brother-in-law thought he was a terrible husband as well.  This infuriated the client.  I used eye-movement with the client around this.  His subjective units of distress was not 8+.  As he processed I asked the client how much of this has changed over the last year and a half?  "Not much at all."  I asked the client how much longer he was willing to put up with this behavior when he only receives positive feedback when he does what she wants?  The client is unsure but will continue to think about this.  His subjective units of distress was less than 4 at the end of the session.  Interventions: Assertiveness/Communication, Motivational Interviewing, Solution-Oriented/Positive Psychology, [employer] Desensitization and Reprocessing (EMDR) and Insight-Oriented  Diagnosis:   ICD-10-CM   1. Adjustment disorder with depressed mood  F43.21     Plan: Boundaries, assertiveness, self-care, exercise, positive self talk.  Inez Rosato, [facility]

## 2019-04-06 ENCOUNTER — Ambulatory Visit (INDEPENDENT_AMBULATORY_CARE_PROVIDER_SITE_OTHER): Payer: 59 | Admitting: Pharmacist

## 2019-04-06 ENCOUNTER — Other Ambulatory Visit: Payer: Self-pay

## 2019-04-06 DIAGNOSIS — Z952 Presence of prosthetic heart valve: Secondary | ICD-10-CM | POA: Diagnosis not present

## 2019-04-06 DIAGNOSIS — Z8673 Personal history of transient ischemic attack (TIA), and cerebral infarction without residual deficits: Secondary | ICD-10-CM

## 2019-04-06 DIAGNOSIS — Z7901 Long term (current) use of anticoagulants: Secondary | ICD-10-CM | POA: Diagnosis not present

## 2019-04-06 LAB — POCT INR: INR: 3.5 — AB (ref 2.0–3.0)

## 2019-04-07 ENCOUNTER — Ambulatory Visit (INDEPENDENT_AMBULATORY_CARE_PROVIDER_SITE_OTHER): Payer: 59 | Admitting: Psychiatry

## 2019-04-07 ENCOUNTER — Encounter: Payer: Self-pay | Admitting: Psychiatry

## 2019-04-07 DIAGNOSIS — F4321 Adjustment disorder with depressed mood: Secondary | ICD-10-CM | POA: Diagnosis not present

## 2019-04-07 NOTE — Progress Notes (Signed)
      [facility] Counselor/Therapist Progress Note  Patient ID: SAMMY TANNEN, MRN: [id number],    Date: 04/07/2019  Time Spent: 50 minutes   Treatment Type: Individual Therapy  Reported Symptoms: irritable  Mental Status Exam:  Appearance:   Casual     Behavior:  Appropriate  Motor:  Normal  Speech/Language:   Clear and Coherent  Affect:  Appropriate  Mood:  irritable  Thought process:  normal  Thought content:    WNL  Sensory/Perceptual disturbances:    WNL  Orientation:  oriented to person, place, time/date and situation  Attention:  Good  Concentration:  Good  Memory:  WNL  Fund of knowledge:   Good  Insight:    Good  Judgment:   Good  Impulse Control:  Good   Risk Assessment: Danger to Self:  No Self-injurious Behavior: No Danger to Others: No Duty to Warn:no Physical Aggression / Violence:No  Access to Firearms a concern: No  Gang Involvement:No   Subjective: The client continues to be frustrated with his wife.  They are living separately and do not seem to be reuniting anytime soon.  The client states that they continue to argue over tax money.  He states that their arguments are circular and never really gets to the point. I asked the client how long will he continue to have the same conversation over and over with no resolution?  The client was thoughtful but did not have a good answer.  We discussed boundaries and continued assertive behavior in asking for what he wants.  The client ultimately knows that he and his wife are rehashing the past.  The difficulty he has is that he is so attracted to her and sees her as so sweet.  Unfortunately, she has not on the same page is him.  As long as he stays in the bubble where it is just them together being intimate everything is fine.  Otherwise when other people enter the picture it becomes complicated and difficult. I discussed with the client that he will ultimately have to make a mood independent decision to end the  relationship.  At this point I believe the client is unwilling to do that.  He will have to develop more radical acceptance to the fact that his wife does not have the same mindset he does.  She is unwilling to work together.  Interventions: Assertiveness/Communication, Motivational Interviewing, Solution-Oriented/Positive Psychology and Insight-Oriented  Diagnosis:   ICD-10-CM   1. Adjustment disorder with depressed mood  F43.21     Plan: Mood independent behavior, radical acceptance, self-care, exercise, boundaries, assertiveness, positive self talk.  Teneil Shiller, [facility]

## 2019-04-17 ENCOUNTER — Other Ambulatory Visit: Payer: Self-pay | Admitting: Cardiology

## 2019-04-21 ENCOUNTER — Encounter: Payer: Self-pay | Admitting: Psychiatry

## 2019-04-21 ENCOUNTER — Ambulatory Visit (INDEPENDENT_AMBULATORY_CARE_PROVIDER_SITE_OTHER): Payer: 59 | Admitting: Psychiatry

## 2019-04-21 ENCOUNTER — Other Ambulatory Visit: Payer: Self-pay

## 2019-04-21 DIAGNOSIS — F4321 Adjustment disorder with depressed mood: Secondary | ICD-10-CM | POA: Diagnosis not present

## 2019-04-21 NOTE — Progress Notes (Signed)
      [facility] Counselor/Therapist Progress Note  Patient ID: Robert [name], MRN: [id number],    Date: 04/21/2019  Time Spent: 50 minutes   Treatment Type: Individual Therapy  Reported Symptoms: frustration  Mental Status Exam:  Appearance:   Casual     Behavior:  Appropriate  Motor:  Normal  Speech/Language:   Clear and Coherent  Affect:  Appropriate  Mood:  irritable  Thought process:  normal  Thought content:    WNL  Sensory/Perceptual disturbances:    WNL  Orientation:  oriented to person, place, time/date and situation  Attention:  Good  Concentration:  Good  Memory:  WNL  Fund of knowledge:   Good  Insight:    Good  Judgment:   Good  Impulse Control:  Good   Risk Assessment: Danger to Self:  No Self-injurious Behavior: No Danger to Others: No Duty to Warn:no Physical Aggression / Violence:No  Access to Firearms a concern: No  Gang Involvement:No   Subjective: The client states that he and his wife are still intimate  but not living together.  She still cannot square with the financial issues that she has with him.  For the time being he seems to have been able to sidestep this issue.  He has found that when she gets angry with him that he begins to articulate that he is sorry that he is meeting her needs incorrectly.  His wife realizes that he is meeting her needs and what she wants.  As he articulates that to his wife she stops and reconsiders what he is saying.  Because what she feels informs her worldview.  What he says helps her shift to see that her feelings are not consistent with what is going on.  He does this without explicitly communicating this to her.  He states that it is based on one of Piaget's theories of learning.  If he can correct the schema then she can assimilate what is going on more effectively. The client has seen a slow shift of how his wife is interacting with him.  He has been very encouraged and hopes as he continues this that it will set  the stage for true reconciliation.  The client is trying to be intentional and thoughtful with his wife and not directly opposing what she says or does.  He continues to be assertive but appropriate in his interactions.  He setting boundaries when necessary and working at taking care of himself.  Interventions: Assertiveness/Communication, Motivational Interviewing, Solution-Oriented/Positive Psychology and Insight-Oriented  Diagnosis:   ICD-10-CM   1. Adjustment disorder with depressed mood  F43.21     Plan: Assertiveness, boundaries, exercise, self-care, thoughtful engagement with his wife.  Selmer Adduci, [facility]

## 2019-05-01 ENCOUNTER — Encounter: Payer: Self-pay | Admitting: Psychiatry

## 2019-05-01 ENCOUNTER — Other Ambulatory Visit: Payer: Self-pay

## 2019-05-01 ENCOUNTER — Ambulatory Visit (INDEPENDENT_AMBULATORY_CARE_PROVIDER_SITE_OTHER): Payer: 59 | Admitting: Psychiatry

## 2019-05-01 DIAGNOSIS — F4321 Adjustment disorder with depressed mood: Secondary | ICD-10-CM

## 2019-05-01 NOTE — Progress Notes (Signed)
      [facility] Counselor/Therapist Progress Note  Patient ID: Robert [name], MRN: [id number],    Date: 05/01/2019  Time Spent: 50 minutes   Treatment Type: Individual Therapy  Reported Symptoms: irritation  Mental Status Exam:  Appearance:   Casual     Behavior:  Appropriate  Motor:  Normal  Speech/Language:   Clear and Coherent  Affect:  Appropriate  Mood:  irritable  Thought process:  normal  Thought content:    WNL  Sensory/Perceptual disturbances:    WNL  Orientation:  oriented to person, place, time/date and situation  Attention:  Good  Concentration:  Good  Memory:  WNL  Fund of knowledge:   Good  Insight:    Good  Judgment:   Good  Impulse Control:  Good   Risk Assessment: Danger to Self:  No Self-injurious Behavior: No Danger to Others: No Duty to Warn:no Physical Aggression / Violence:No  Access to Firearms a concern: No  Gang Involvement:No   Subjective: The client states that he has not made much progress in his relationship with his wife.  They continue to live separated and he spends most nights with her.  I asked the client if he felt like his wife was any closer to wanting to move back in together?  He said, "absolutely not."  This continues to be difficult for the client he finds himself irritated with his wife's dismissal of his overtures to her. Some of the main issues are still there such as her codependence on her children.  They seem to consistently in different ways take advantage of her good nature and goodwill.  Part of the original issue was when the client was trying to set boundaries for his wife with her children.  She balked at that which ultimately resulted in their separation.  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 The client continues to work on his own mental health.  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  He is exercising regularly.  I asked the client to consider what he needed to work on going forward.  The client will consider that.   Interventions: Assertiveness/Communication, Motivational Interviewing, Solution-Oriented/Positive Psychology and Insight-Oriented  Diagnosis:   ICD-10-CM   1. Adjustment disorder with depressed mood  F43.21     Plan: mood independent behavior, self care, exercise, boundaries, assertiveness.  Rashema Seawright, [facility]

## 2019-05-12 ENCOUNTER — Ambulatory Visit (INDEPENDENT_AMBULATORY_CARE_PROVIDER_SITE_OTHER): Payer: 59 | Admitting: Psychiatry

## 2019-05-12 ENCOUNTER — Encounter: Payer: Self-pay | Admitting: Psychiatry

## 2019-05-12 ENCOUNTER — Other Ambulatory Visit: Payer: Self-pay

## 2019-05-12 DIAGNOSIS — F4321 Adjustment disorder with depressed mood: Secondary | ICD-10-CM
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
## 2019-05-18 ENCOUNTER — Ambulatory Visit (INDEPENDENT_AMBULATORY_CARE_PROVIDER_SITE_OTHER): Payer: 59 | Admitting: Pharmacist Clinician (PhC)/ Clinical Pharmacy Specialist

## 2019-05-18 ENCOUNTER — Other Ambulatory Visit: Payer: Self-pay

## 2019-05-18 DIAGNOSIS — Z7901 Long term (current) use of anticoagulants: Secondary | ICD-10-CM | POA: Diagnosis not present

## 2019-05-18 DIAGNOSIS — Z952 Presence of prosthetic heart valve: Secondary | ICD-10-CM | POA: Diagnosis not present

## 2019-05-18 DIAGNOSIS — Z8673 Personal history of transient ischemic attack (TIA), and cerebral infarction without residual deficits: Secondary | ICD-10-CM

## 2019-05-18 LAB — POCT INR: INR: 2.4 (ref 2.0–3.0)

## 2019-05-26 ENCOUNTER — Encounter: Payer: Self-pay | Admitting: Psychiatry

## 2019-05-26 ENCOUNTER — Ambulatory Visit (INDEPENDENT_AMBULATORY_CARE_PROVIDER_SITE_OTHER): Payer: 59 | Admitting: Psychiatry

## 2019-05-26 ENCOUNTER — Other Ambulatory Visit: Payer: Self-pay

## 2019-05-26 DIAGNOSIS — F4321 Adjustment disorder with depressed mood: Secondary | ICD-10-CM | POA: Diagnosis not present

## 2019-05-26 NOTE — Progress Notes (Signed)
      [facility] Counselor/Therapist Progress Note  Patient ID: Robert [name], MRN: [id number],    Date: 05/26/2019  Time Spent: 50 minutes   Treatment Type: Individual Therapy  Reported Symptoms: sad, discouraged  Mental Status Exam:  Appearance:   Casual     Behavior:  Appropriate  Motor:  Normal  Speech/Language:   Clear and Coherent  Affect:  Appropriate  Mood:  sad  Thought process:  normal  Thought content:    WNL  Sensory/Perceptual disturbances:    WNL  Orientation:  oriented to person, place, time/date and situation  Attention:  Good  Concentration:  Good  Memory:  WNL  Fund of knowledge:   Good  Insight:    Good  Judgment:   Good  Impulse Control:  Good   Risk Assessment: Danger to Self:  No Self-injurious Behavior: No Danger to Others: No Duty to Warn:no Physical Aggression / Violence:No  Access to Firearms a concern: No  Gang Involvement:No   Subjective: The client states that his 19 year old father is being released from the hospital today.  He woke up early Sunday morning unable to walk and was admitted to the hospital.  Client states he will most likely be discharged to a palliative care facility for the time being.  He is very sad that it is coming down to the end for his dad.  He knows this is expected but it is difficult nonetheless. The client also continues to struggle in his relationship with his wife.  They did not have sex for over a week which is unusual for them.  He states his wife gets feedback from her girlfriends telling her, "he is just using you for sex."  The client states that his wife always asks him over and asks him to stay the night.  She never declines sex and seems all to happy to participate.  He states it is only the next morning that she becomes irritated.  They also continue to have conflict over the finances concerning taxes and monies from the house.  They continue to not make much progress in this area.  He has stayed out of any  parenting with the stepchildren per her request.  I asked the client how long they have been separated.  "It has been over 2 years."  I asked the client how much longer would he let this drag on?  He states he is still hopeful for change but is not sure how long it will take.  Interventions: Assertiveness/Communication, Mindfulness Meditation, Motivational Interviewing, Solution-Oriented/Positive Psychology and Insight-Oriented  Diagnosis:   ICD-10-CM   1. Adjustment disorder with depressed mood  F43.21     Plan: Self-care, positive self talk, assertiveness, boundaries.  Kaileigh Viswanathan, [facility]

## 2019-06-09 ENCOUNTER — Ambulatory Visit (INDEPENDENT_AMBULATORY_CARE_PROVIDER_SITE_OTHER): Payer: 59 | Admitting: Psychiatry

## 2019-06-09 ENCOUNTER — Encounter: Payer: Self-pay | Admitting: Psychiatry

## 2019-06-09 DIAGNOSIS — F4321 Adjustment disorder with depressed mood: Secondary | ICD-10-CM

## 2019-06-09 NOTE — Progress Notes (Signed)
[facility] Counselor/Therapist Progress Note  Patient ID: Robert [name], MRN: [id number],    Date: 06/09/2019  Time Spent: 50 minutes   Treatment Type: Individual Therapy  Reported Symptoms: sad  Mental Status Exam:  Appearance:   Casual     Behavior:  Appropriate  Motor:  Normal  Speech/Language:   Clear and Coherent  Affect:  Appropriate  Mood:  sad  Thought process:  normal  Thought content:    WNL  Sensory/Perceptual disturbances:    WNL  Orientation:  oriented to person, place, time/date and situation  Attention:  Good  Concentration:  Good  Memory:  WNL  Fund of knowledge:   Good  Insight:    Good  Judgment:   Good  Impulse Control:  Good   Risk Assessment: Danger to Self:  No Self-injurious Behavior: No Danger to Others: No Duty to Warn:no Physical Aggression / Violence:No  Access to Firearms a concern: No  Gang Involvement:No   Subjective: I connected with this patient by an approved telecommunication method (video), with his informed consent, and verifying identity and patient privacy.  I was located at my office and patient at his home.  As needed, we discussed the limitations, risks, and security and privacy concerns associated with telehealth service, including the availability and conditions which currently govern in-person appointments and the possibility that 3rd-party payment Kailin Leu not be fully guaranteed and he Cristalle Rohm be responsible for charges.  After he indicated understanding, we proceeded with the session.  Also discussed treatment planning, as needed, including ongoing verbal agreement with the plan, the opportunity to ask and answer all questions, his demonstrated understanding of instructions, and his readiness to call the office should symptoms worsen or he feels he is in a crisis state and needs more immediate and tangible assistance. The client is currently in [place] at his parents home.  His father is in palliative care.  The client is there by  his bedside with both his older brother and sister.  He states that things have gone fairly well with his siblings.  He states there seems to be a little bit of tension between him and his older sister.  She tends to separate herself from the client and his brother.  He is puzzled about this and dialogue at length about the different dynamics in the family.  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  His oldest brother who was the acting chief Research scientist (life sciences) was spending money in a way that the client was not comfortable with his brother offered to buy him out which the client accepted.  Apparently his sister still holds some hostility around that dynamic. The client is trying to honor his father and be there with him to the best of his ability.  He has been with him almost 2 weeks now and sees that his decline is inevitable.  The client states that he is at peace with his dad.  We discussed the grief process and what the client could expect.  He had previously done some work on the front end having clear communication with his father and leaving no issues unresolved.  As with his mother the client is okay but just sad in the process. He continues to take care of himself using mood independent behavior, regular exercise and a radical acceptance of the current circumstances.  Interventions: Assertiveness/Communication, Motivational Interviewing, Solution-Oriented/Positive Psychology and Insight-Oriented  Diagnosis:   ICD-10-CM  1. Adjustment disorder with depressed mood  F43.21     Plan: Mood independent behavior, positive self talk, radical acceptance, assertiveness, boundaries, exercise.  Tesla Keeler, [facility]

## 2019-06-23 ENCOUNTER — Other Ambulatory Visit: Payer: Self-pay

## 2019-06-23 ENCOUNTER — Ambulatory Visit (INDEPENDENT_AMBULATORY_CARE_PROVIDER_SITE_OTHER): Payer: 59 | Admitting: Psychiatry

## 2019-06-23 ENCOUNTER — Encounter: Payer: Self-pay | Admitting: Psychiatry

## 2019-06-23 DIAGNOSIS — F4321 Adjustment disorder with depressed mood: Secondary | ICD-10-CM | POA: Diagnosis not present

## 2019-06-23 NOTE — Progress Notes (Signed)
      [facility] Counselor/Therapist Progress Note  Patient ID: JOHNSTON HOMAN, MRN: [id number],    Date: 06/23/2019  Time Spent: 50 minutes   Treatment Type: Individual Therapy  Reported Symptoms: sad  Mental Status Exam:  Appearance:   Casual     Behavior:  Appropriate  Motor:  Normal  Speech/Language:   Clear and Coherent  Affect:  Appropriate  Mood:  sad  Thought process:  normal  Thought content:    WNL  Sensory/Perceptual disturbances:    WNL  Orientation:  oriented to person, place, time/date and situation  Attention:  Good  Concentration:  Good  Memory:  WNL  Fund of knowledge:   Good  Insight:    Good  Judgment:   Good  Impulse Control:  Good   Risk Assessment: Danger to Self:  No Self-injurious Behavior: No Danger to Others: No Duty to Warn:no Physical Aggression / Violence:No  Access to Firearms a concern: No  Gang Involvement:No   Subjective: The client states that his dad died 2022/07/10.  "He had a beautiful service but my sister never hugged to me."  He states that his sister avoided both him and his brother which was disappointing.  He also found his sister-in-law to be very difficult as well.  She seems to be fairly self centered and at times was overtly critical of him.  The client was puzzled as to why the women in his life seem to be so negative towards him.  I pointed out to the client that the women that he describes are relationally not very functional.  It would not be surprising to have difficulties.  He agrees.  He and his wife continue their dance of having an intimate relationship but not moving forward in the reconciliation of their marriage.  The client is very disappointed by this.  He continues to work on his radical acceptance with this.   The client is also sad because he feels he does not have a purpose.  He is retired from [employer] that he was working for.  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  He wants to continue to do that as well.  I use the bilateral stimulation hand paddles with the client as he was processing this.  We discussed that it next time to flesh out what would be something meaningful and purposeful he could do.  Interventions: Assertiveness/Communication, Motivational Interviewing, Solution-Oriented/Positive Psychology, [employer] Group Desensitization and Reprocessing (EMDR) and Insight-Oriented  Diagnosis:   ICD-10-CM   1. Adjustment disorder with depressed mood  F43.21     Plan: Boundaries, assertiveness, positive self talk, self-care, exercise, radical acceptance.  Anjelica Gorniak, [facility]

## 2019-06-29 ENCOUNTER — Ambulatory Visit (INDEPENDENT_AMBULATORY_CARE_PROVIDER_SITE_OTHER): Payer: 59 | Admitting: Pharmacist

## 2019-06-29 ENCOUNTER — Other Ambulatory Visit: Payer: Self-pay

## 2019-06-29 DIAGNOSIS — Z7901 Long term (current) use of anticoagulants: Secondary | ICD-10-CM | POA: Diagnosis not present

## 2019-06-29 DIAGNOSIS — Z952 Presence of prosthetic heart valve: Secondary | ICD-10-CM

## 2019-06-29 DIAGNOSIS — Z8673 Personal history of transient ischemic attack (TIA), and cerebral infarction without residual deficits: Secondary | ICD-10-CM | POA: Diagnosis not present

## 2019-06-29 LAB — POCT INR: INR: 2.1 (ref 2.0–3.0)

## 2019-07-27 ENCOUNTER — Ambulatory Visit: Payer: 59 | Attending: Internal Medicine

## 2019-07-27 DIAGNOSIS — Z23 Encounter for immunization: Secondary | ICD-10-CM

## 2019-07-27 NOTE — Progress Notes (Signed)
   Covid-19 Vaccination Clinic  Name:  HELMUTH RAULS    MRN: [id number] DOB: 09-11-1960  07/27/2019  Mr. Regan was observed post Covid-19 immunization for 15 minutes without incident. He was provided with Vaccine Information Sheet and instruction to access the V-Safe system.   Mr. Clanton was instructed to call 911 with any severe reactions post vaccine: Marland Kitchen Difficulty breathing  . Swelling of face and throat  . A fast heartbeat  . A bad rash all over body  . Dizziness and weakness   Immunizations Administered    Name Date Dose VIS Date Route   Moderna COVID-19 Vaccine 07/27/2019  1:42 PM 0.5 mL 02/2019 Intramuscular   Manufacturer: Moderna   LotFP:[id number]   [place]:[id number]

## 2019-07-29 ENCOUNTER — Other Ambulatory Visit: Payer: Self-pay

## 2019-07-29 ENCOUNTER — Ambulatory Visit (INDEPENDENT_AMBULATORY_CARE_PROVIDER_SITE_OTHER): Payer: 59 | Admitting: *Deleted

## 2019-07-29 DIAGNOSIS — Z5181 Encounter for therapeutic drug level monitoring: Secondary | ICD-10-CM | POA: Diagnosis not present

## 2019-07-29 DIAGNOSIS — Z7901 Long term (current) use of anticoagulants: Secondary | ICD-10-CM | POA: Diagnosis not present

## 2019-07-29 DIAGNOSIS — Z952 Presence of prosthetic heart valve: Secondary | ICD-10-CM

## 2019-07-29 DIAGNOSIS — Z8673 Personal history of transient ischemic attack (TIA), and cerebral infarction without residual deficits: Secondary | ICD-10-CM

## 2019-07-29 LAB — POCT INR: INR: 1.9 — AB (ref 2.0–3.0)

## 2019-07-29 NOTE — Patient Instructions (Signed)
Description   Take 2 tablets today, then start taking 1.5 tablets daily except for 1 tablet on Mondays. Call coumadin clinic [phone] if you have missed a dose or had any recent changes in medications or upcoming procedures.  Recheck Inr in 3 weeks.

## 2019-07-30 ENCOUNTER — Encounter: Payer: Self-pay | Admitting: Psychiatry

## 2019-07-30 ENCOUNTER — Ambulatory Visit (INDEPENDENT_AMBULATORY_CARE_PROVIDER_SITE_OTHER): Payer: 59 | Admitting: Psychiatry

## 2019-07-30 DIAGNOSIS — F4321 Adjustment disorder with depressed mood: Secondary | ICD-10-CM | POA: Diagnosis not present

## 2019-07-30 NOTE — Progress Notes (Signed)
      [facility] Counselor/Therapist Progress Note  Patient ID: Robert [name], MRN: [id number],    Date: 07/30/2019  Time Spent: 74 Minutes   Treatment Type: Individual Therapy  Reported Symptoms: sad, grief  Mental Status Exam:  Appearance:   Casual     Behavior:  Appropriate  Motor:  Normal  Speech/Language:   Clear and Coherent  Affect:  Appropriate  Mood:  sad  Thought process:  normal  Thought content:    WNL  Sensory/Perceptual disturbances:    WNL  Orientation:  oriented to person, place, time/date and situation  Attention:  Good  Concentration:  Good  Memory:  WNL  Fund of knowledge:   Good  Insight:    Good  Judgment:   Good  Impulse Control:  Good   Risk Assessment: Danger to Self:  No Self-injurious Behavior: No Danger to Others: No Duty to Warn:no Physical Aggression / Violence:No  Access to Firearms a concern: No  Gang Involvement:No   Subjective: The client states that things are not going well.  He recently returned from [place] after his father's funeral.  He states his wife is worn out and frustrated with her business, her children and the client.  He is still grieving the loss of his dad and feels lost in his life.  His wife tells him that her feelings are hurt that he did not sign up for life insurance once he retired from work.  The client stated that he told her he had consulted with an Research scientist (life sciences) who told him that it would be prohibitively expensive at age 77 to sign up for life insurance.  His wife is unwilling to accept that.  Her expectation is that he will foot the bill for her children's college and health care.  She will not allow the client to take part in any kind of parenting or decision-making for the children.  This hurts the client deeply and makes him feel marginalized. As the client's dad's estate is being settled the client states he is very sad his dad is gone.  His father retired at age 60 and did not do anything until his death 30  years later.  The client retired at 48 and wonders today, "what will I do?"  We discussed the boundaries that would be necessary for his wife.  I also suggested that the client cast his net wide to consider all possibilities of things that he Mikahla Wisor do.  He has researched and almost has a prototype for a battery system that would manage electrical usage more efficiently.  He also is considering a mission trip.  I encouraged the client to consider reading the book, "the search for God and [place]".  This book recounts the decisions of the [place] family to use their wealth and influence to help the less fortunate citizens in the city of [place].  I explained to the client that it Lydia Meng give him inspiration of how he can help others.  He agreed to look at that.  Interventions: Assertiveness/Communication, Mindfulness Meditation, Motivational Interviewing, Solution-Oriented/Positive Psychology, [employer] Group Desensitization and Reprocessing (EMDR) and Insight-Oriented  Diagnosis:   ICD-10-CM   1. Adjustment disorder with depressed mood  F43.21     Plan: Mindful prayer, assertiveness, boundaries, self-care, exercise, reading, positive self talk.  Sankalp Ferrell, [facility]

## 2019-08-12 ENCOUNTER — Ambulatory Visit (INDEPENDENT_AMBULATORY_CARE_PROVIDER_SITE_OTHER): Payer: 59 | Admitting: Psychiatry

## 2019-08-12 ENCOUNTER — Encounter: Payer: Self-pay | Admitting: Psychiatry

## 2019-08-12 ENCOUNTER — Other Ambulatory Visit: Payer: Self-pay

## 2019-08-12 DIAGNOSIS — F4321 Adjustment disorder with depressed mood: Secondary | ICD-10-CM

## 2019-08-12 NOTE — Progress Notes (Signed)
      [facility] Counselor/Therapist Progress Note  Patient ID: Robert [name], MRN: [id number],    Date: 08/12/2019  Time Spent: 50 minutes   Treatment Type: Individual Therapy  Reported Symptoms: sad  Mental Status Exam:  Appearance:   Casual     Behavior:  Appropriate  Motor:  Normal  Speech/Language:   Clear and Coherent  Affect:  Appropriate  Mood:  sad  Thought process:  normal  Thought content:    WNL  Sensory/Perceptual disturbances:    WNL  Orientation:  oriented to person, place, time/date and situation  Attention:  Good  Concentration:  Good  Memory:  WNL  Fund of knowledge:   Good  Insight:    Good  Judgment:   Good  Impulse Control:  Good   Risk Assessment: Danger to Self:  No Self-injurious Behavior: No Danger to Others: No Duty to Warn:no Physical Aggression / Violence:No  Access to Firearms a concern: No  Gang Involvement:No   Subjective: The client states that his wife is in a worse place.  She is suffering from compassion fatigue.  The client and his wife are still separated going on 2-1/4 years.  They each have their own house.  Client states his wife asked him this past weekend to fence in his front yard so she could house her 2 horses there.  The client told her that he would not do that.  They have had this conversation before but she continues to press him. I discussed with the client the nature of his relationship with his wife.  They have a sexual relationship which seems to go well according to his report.  Anything beyond that is usually fraught with some kind of conflict.  He is kept on the edge of her relationships with her children.  He has paid for many of her children (6 total) to go to college out of the goodness of his own heart.  It is not swayed her at all towards reconciliation.  As we had this discussion, I use the bilateral stimulation hand paddles with the client to reduce his sadness and help him get to the calmer place.  I discussed  with the client ongoing boundaries.  He continues to exercise and take care of his 11 acre property.  He is still hopeful for some kind of reconciliation.  I asked the client to think about what he wanted to do concerning work going forward since his retirement.  Interventions: Assertiveness/Communication, Motivational Interviewing, Solution-Oriented/Positive Psychology, [employer] Group Desensitization and Reprocessing (EMDR) and Insight-Oriented  Diagnosis:   ICD-10-CM   1. Adjustment disorder with depressed mood  F43.21     Plan: Boundaries, positive self talk, self-care, exercise, assertiveness.  Robert [name], [facility]

## 2019-08-19 ENCOUNTER — Other Ambulatory Visit: Payer: Self-pay

## 2019-08-19 ENCOUNTER — Ambulatory Visit (INDEPENDENT_AMBULATORY_CARE_PROVIDER_SITE_OTHER): Payer: 59 | Admitting: Pharmacist

## 2019-08-19 DIAGNOSIS — Z7901 Long term (current) use of anticoagulants: Secondary | ICD-10-CM | POA: Diagnosis not present

## 2019-08-19 DIAGNOSIS — Z952 Presence of prosthetic heart valve: Secondary | ICD-10-CM

## 2019-08-19 DIAGNOSIS — Z8673 Personal history of transient ischemic attack (TIA), and cerebral infarction without residual deficits: Secondary | ICD-10-CM

## 2019-08-19 LAB — POCT INR: INR: 2 (ref 2.0–3.0)

## 2019-08-26 ENCOUNTER — Other Ambulatory Visit: Payer: Self-pay

## 2019-08-26 ENCOUNTER — Ambulatory Visit (INDEPENDENT_AMBULATORY_CARE_PROVIDER_SITE_OTHER): Payer: 59 | Admitting: Psychiatry

## 2019-08-26 ENCOUNTER — Encounter: Payer: Self-pay | Admitting: Psychiatry

## 2019-08-26 DIAGNOSIS — F4321 Adjustment disorder with depressed mood: Secondary | ICD-10-CM

## 2019-08-26 NOTE — Progress Notes (Signed)
      [facility] Counselor/Therapist Progress Note  Patient ID: Robert [name], MRN: [id number],    Date: 08/26/2019  Time Spent: 50 minutes   Treatment Type: Individual Therapy  Reported Symptoms: sad  Mental Status Exam:  Appearance:   Casual     Behavior:  Appropriate  Motor:  Normal  Speech/Language:   Clear and Coherent  Affect:  Appropriate  Mood:  sad  Thought process:  normal  Thought content:    WNL  Sensory/Perceptual disturbances:    WNL  Orientation:  oriented to person, place, time/date and situation  Attention:  Good  Concentration:  Good  Memory:  WNL  Fund of knowledge:   Good  Insight:    Good  Judgment:   Good  Impulse Control:  Good   Risk Assessment: Danger to Self:  No Self-injurious Behavior: No Danger to Others: No Duty to Warn:no Physical Aggression / Violence:No  Access to Firearms a concern: No  Gang Involvement:No   Subjective: "I have not been as productive as I would like to be.  I am working on that."  The client has been asking the question today, "what should I do?"  He wants to do something that is going to have a positive impact on others.  He thinks he Berneita Sanagustin want to mentor younger men or get involved in a discipleship program.  He has some finances and would like to help a student go to [employer].  The client will look into spiritual direction and the training involved with that. The client states that things are still not going well with his wife.  He states that she is mostly upset because he does not give her financial support with no strings attached.  They continue to have an atypical marriage.  The client periodically meets with his wife.  He stays over with her sometimes but mainly lives at his own house.  He continues to exercise, practicing mood independent behavior and self-care.  Interventions: Assertiveness/Communication, Motivational Interviewing, Solution-Oriented/Positive Psychology and Insight-Oriented  Diagnosis:  ICD-10-CM   1. Adjustment disorder with depressed mood  F43.21     Plan: Mood independent behavior, self-care, exercise, boundaries, assertiveness.  Jya Hughston, [facility]

## 2019-08-28 ENCOUNTER — Ambulatory Visit (INDEPENDENT_AMBULATORY_CARE_PROVIDER_SITE_OTHER): Payer: 59 | Admitting: Pharmacist

## 2019-08-28 ENCOUNTER — Telehealth: Payer: Self-pay | Admitting: *Deleted

## 2019-08-28 ENCOUNTER — Other Ambulatory Visit: Payer: Self-pay

## 2019-08-28 ENCOUNTER — Ambulatory Visit (INDEPENDENT_AMBULATORY_CARE_PROVIDER_SITE_OTHER): Payer: 59 | Admitting: Cardiology

## 2019-08-28 ENCOUNTER — Encounter: Payer: Self-pay | Admitting: Cardiology

## 2019-08-28 VITALS — BP 128/74 | HR 52 | Ht 71.0 in | Wt 179.0 lb

## 2019-08-28 DIAGNOSIS — E785 Hyperlipidemia, unspecified: Secondary | ICD-10-CM

## 2019-08-28 DIAGNOSIS — Z952 Presence of prosthetic heart valve: Secondary | ICD-10-CM

## 2019-08-28 DIAGNOSIS — Z7901 Long term (current) use of anticoagulants: Secondary | ICD-10-CM

## 2019-08-28 DIAGNOSIS — Z5181 Encounter for therapeutic drug level monitoring: Secondary | ICD-10-CM

## 2019-08-28 DIAGNOSIS — Z8673 Personal history of transient ischemic attack (TIA), and cerebral infarction without residual deficits: Secondary | ICD-10-CM

## 2019-08-28 LAB — CBC
Hematocrit: 47.6 % (ref 37.5–51.0)
Hemoglobin: 15.3 g/dL (ref 13.0–17.7)
MCH: 28.8 pg (ref 26.6–33.0)
MCHC: 32.1 g/dL (ref 31.5–35.7)
MCV: 90 fL (ref 79–97)
Platelets: 263 10*3/uL (ref 150–450)
RBC: 5.31 x10E6/uL (ref 4.14–5.80)
RDW: 13 % (ref 11.6–15.4)
WBC: 4.5 10*3/uL (ref 3.4–10.8)

## 2019-08-28 LAB — COMPREHENSIVE METABOLIC PANEL
ALT: 20 IU/L (ref 0–44)
AST: 36 IU/L (ref 0–40)
Albumin/Globulin Ratio: 1.9 (ref 1.2–2.2)
Albumin: 4.6 g/dL (ref 3.8–4.9)
Alkaline Phosphatase: 85 IU/L (ref 48–121)
BUN/Creatinine Ratio: 23 — ABNORMAL HIGH (ref 9–20)
BUN: 22 mg/dL (ref 6–24)
Bilirubin Total: 1.2 mg/dL (ref 0.0–1.2)
CO2: 24 mmol/L (ref 20–29)
Calcium: 9.1 mg/dL (ref 8.7–10.2)
Chloride: 103 mmol/L (ref 96–106)
Creatinine, Ser: 0.97 mg/dL (ref 0.76–1.27)
GFR calc Af Amer: 99 mL/min/{1.73_m2} (ref >59)
GFR calc non Af Amer: 86 mL/min/{1.73_m2} (ref >59)
Globulin, Total: 2.4 g/dL (ref 1.5–4.5)
Glucose: 90 mg/dL (ref 65–99)
Potassium: 4.9 mmol/L (ref 3.5–5.2)
Sodium: 142 mmol/L (ref 134–144)
Total Protein: 7 g/dL (ref 6.0–8.5)

## 2019-08-28 LAB — POCT INR: INR: 3.3 — AB (ref 2.0–3.0)

## 2019-08-28 LAB — LIPID PANEL
Chol/HDL Ratio: 3.7 ratio (ref 0.0–5.0)
Cholesterol, Total: 247 mg/dL — ABNORMAL HIGH (ref 100–199)
HDL: 67 mg/dL (ref >39)
LDL Chol Calc (NIH): 165 mg/dL — ABNORMAL HIGH (ref 0–99)
Triglycerides: 86 mg/dL (ref 0–149)
VLDL Cholesterol Cal: 15 mg/dL (ref 5–40)

## 2019-08-28 LAB — TSH: TSH: 1.33 u[IU]/mL (ref 0.450–4.500)

## 2019-08-28 MED ORDER — ATORVASTATIN CALCIUM 10 MG PO TABS
10.0000 mg | ORAL_TABLET | Freq: Every day | ORAL | 1 refills | Status: DC
Start: 2019-08-28 — End: 2021-06-27

## 2019-08-28 NOTE — Telephone Encounter (Signed)
Spoke with the pt and informed him of lab results per Dr. [name].  Did confirm with him if he is taking atorvastatin or not? He states he stopped because he didn't want to affect his liver. Informed him that his liver function was normal, and he should start back taking his atorvastatin, and we will add this back to his med list. Informed him that I will make Dr. [name] aware of this, and that he agrees to start back taking his statin. Informed the pt that I will follow-up with him if further recommendations are given. Pt verbalized understanding and agrees with this plan.

## 2019-08-28 NOTE — Patient Instructions (Signed)
Continue taking 1.5 tablets daily. Call coumadin clinic [phone] if you have missed a dose or had any recent changes in medications or upcoming procedures.  Recheck INR in 3 weeks.

## 2019-08-28 NOTE — Telephone Encounter (Signed)
-----   Message from Dorothy Spark, MD sent at 08/28/2019  5:39 PM EDT ----- All labs normal - electrolytes, kidney, liver, thyroid function, CBC< severely elevated LDL. why did he stop taking atorvastatin? If he didn't have myalgias he should restart

## 2019-08-28 NOTE — Progress Notes (Signed)
Cardiology Office Note:    Date:  08/28/2019   ID:  Madaline Savage, DOB Jul 20, 1960, MRN [id number]  PCP:  [name], MD  [facility] Cardiologist:  No primary care provider on file.  [facility] Electrophysiologist:  None   Referring MD: [name], MD   Reason for visit: 2-year follow-up  History of Present Illness:    Robert [name] is a 59 y.o. 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  08/28/2019 -the patient is coming after 2 years, he has been doing great, he has been increasing his running distances now running 40 miles a week.  He has no chest pain shortness of breath with that.  He is compliant with Coumadin and has no bleeding.  He underwent foot surgery a year ago for accident with a chainsaw.  He has minimal lower extremity edema toward the end of the days.  Blood pressure is controlled.  Denies any palpitation dizziness or syncope.  No strokelike symptoms.  Patient thinks he might have had Covid infection in January 2020.  He was symptomatic with cough but no chest pain, shortness of breath palpitations.  Past Medical History:  Diagnosis Date   Aneurysm of ascending aorta ([place])    Anxiety    Blood transfusion without reported diagnosis    Cataract    BEGINNING   CVA (cerebral infarction)    DVT (deep venous thrombosis) (HCC)    Hx of adenomatous colonic polyps 05/11/2014   Hyperlipidemia    Migraine headache    Renal stone    S/P aortic valve replacement    Stroke ([place])    Subacute bacterial endocarditis (SBE)     Past Surgical History:  Procedure Laterality Date   ABDOMINAL AORTIC ANEURYSM REPAIR     AORTIC VALVE REPLACEMENT  2007    CARDIAC CATHETERIZATION  01/2005   COLON SURGERY  2010   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    enoxaparin (LOVENOX) 80 MG/0.8ML injection Inject 0.8 mLs (80 mg total) into the skin every 12 (twelve) hours.   gabapentin (NEURONTIN) 400 MG capsule Take 400 mg by mouth as needed.   warfarin (COUMADIN) 5 MG tablet TAKE 1 TO 1 AND 1/2 DAILY AS DIRECTED BY COUMADIN CLINIC   Current Facility-Administered Medications for the 08/28/19 encounter (Office Visit) with Dorothy Spark, MD  Medication   0.9 %  sodium chloride infusion     Allergies:   Tape and Tapentadol   Social History   Socioeconomic History   Marital status: Married    Spouse name: [name]   Number of children: 8   Years of education: BSEE   Highest education  level: Not on file  Occupational History   Occupation: Scientist, forensic: [employer]  Tobacco Use   Smoking status: Never Smoker   Smokeless tobacco: Never Used  Substance and Sexual Activity   Alcohol use: Yes    Comment: rare   Drug use: No   Sexual activity: Yes    Partners: Female  Other Topics Concern   Not on file  Social History Narrative   Patient lives at home with his wife [name].    Patient has 8 children. Boys   Patient is an Art gallery manager.         Social Determinants of Health   Financial Resource Strain:    Difficulty of Paying Living Expenses:   Food  Insecurity:    Worried About Charity fundraiser in the Last Year:    Arboriculturist in the Last Year:   Transportation Needs:    Film/video editor (Medical):    Lack of Transportation (Non-Medical):   Physical Activity:    Days of Exercise per Week:    Minutes of Exercise per Session:   Stress:    Feeling of Stress :   Social Connections:    Frequency of Communication with Friends and Family:    Frequency of Social Gatherings with Friends and Family:    Attends Religious Services:    Active Member of Clubs or Organizations:    Attends Music therapist:    Marital Status:      Family History: The patient's family history includes Bladder Cancer in his maternal grandfather; CVA (age of onset: 10) in his mother; Colon cancer in his paternal grandfather and paternal grandmother. There is no history of Diabetes, Kidney disease, Esophageal cancer, Gallbladder disease, Heart disease, Pancreatic cancer, Colon polyps, Rectal cancer, or Stomach cancer.  ROS:   Please see the history of present illness.    All other systems reviewed and are negative.  EKGs/Labs/Other Studies Reviewed:    The following studies were reviewed today: EKG:  EKG is ordered today.  The ekg ordered today demonstrates sinus bradycardia, normal EKG, otherwise normal EKG.  Recent Labs: No results found for requested labs within last 8760 hours.   Recent Lipid Panel    Component Value Date/Time   CHOL 154 02/23/2015 0001   TRIG 82 02/23/2015 0001   HDL 55 02/23/2015 0001   CHOLHDL 2.8 02/23/2015 0001   VLDL 16 02/23/2015 0001   LDLCALC 83 02/23/2015 0001   Physical Exam:    VS:  BP 128/74    Pulse (!) 52    Ht '5\' 11"'$  (1.803 m)    Wt 179 lb (81.2 kg)    SpO2 98%    BMI 24.97 kg/m     Wt Readings from Last 3 Encounters:  08/28/19 179 lb (81.2 kg)  02/24/18 181 lb (82.1 kg)  02/17/18 180 lb (81.6 kg)    GEN: Well nourished, well developed in no acute distress HEENT:  Normal NECK: No JVD; No carotid bruits LYMPHATICS: No lymphadenopathy CARDIAC: RRR, 2 out of 6 systolic, metallic valve click, no rubs, gallops RESPIRATORY:  Clear to auscultation without rales, wheezing or rhonchi  ABDOMEN: Soft, non-tender, non-distended MUSCULOSKELETAL:  No edema; No deformity  SKIN: Warm and dry NEUROLOGIC:  Alert and oriented x 3 PSYCHIATRIC:  Normal affect    ASSESSMENT:    1. Chronic anticoagulation   2. S/P AVR (aortic valve replacement)   3. Encounter for therapeutic  drug monitoring   4. Hyperlipidemia LDL goal <100    PLAN:    In order of problems listed above:  1. History of bicuspid aortic valve and aortic root dilatation status post AVR and aortic root replacement in 2007 and AVR with mechanical bicuspid valve in 2010 for infection complicated by embolic events. Patient has chronic Coumadin and he is very compliant.  Last echocardiogram in 2018 showed normal transaortic gradients.  We will repeat now.  2. Blood pressure - controlled.  3. Lipids - all at goal in 12/2014 -previously on atorvastatin, will recheck lipids now.  Continue atorvastatin 10 mg po daily.  4.  Possible Covid infection in January 2020 -we will repeat echocardiogram.   Medication Adjustments/Labs and Tests Ordered: Current medicines are reviewed at length with the patient today.  Concerns regarding medicines are outlined above.  Orders Placed This Encounter  Procedures   Comp Met (CMET)   CBC   Lipid Profile   TSH   EKG 12-Lead   ECHOCARDIOGRAM COMPLETE   No orders of the defined types were placed in this encounter.   Patient Instructions  Medication Instructions:   Your physician recommends that you continue on your current medications as directed. Please refer to the Current Medication list given to you today.  *If you need a refill on your cardiac medications before your next appointment, please call your pharmacy*   Lab Work:  TODAY--CMET, CBC, TSH,  AND LIPIDS  If you have labs (blood work) drawn today and your tests are completely normal, you will receive your results only by:  [place] (if you have MyChart) OR  A paper copy in the mail If you have any lab test that is abnormal or we need to change your treatment, we will call you to review the results.   Testing/Procedures:  Your physician has requested that you have an echocardiogram. Echocardiography is a painless test that uses sound waves to create images of your heart. It provides your doctor with information about the size and shape of your heart and how well your hearts chambers and valves are working. This procedure takes approximately one hour. There are no restrictions for this procedure.  Follow-Up: At [facility], you and your health needs are our priority.  As part of our continuing mission to provide you with exceptional heart care, we have created designated Provider Care Teams.  These Care Teams include your primary Cardiologist (physician) and Advanced Practice Providers (APPs -  Physician Assistants and Nurse Practitioners) who all work together to provide you with the care you need, when you need it.  We recommend signing up for the patient portal called "MyChart".  Sign up information is provided on this After Visit Summary.  MyChart is used to connect with patients for Virtual Visits (Telemedicine).  Patients are able to view lab/test results, encounter notes, upcoming appointments, etc.  Non-urgent messages can be sent to your provider as well.   To learn more about what you can do with MyChart, go to [email or web address].    Your next appointment:   12 month(s)  The format for your next appointment:   In Person  Provider:   Ena Dawley, MD       Signed, Ena Dawley, MD  08/28/2019 9:15 AM    [place]

## 2019-08-28 NOTE — Patient Instructions (Addendum)
Medication Instructions:   Your physician recommends that you continue on your current medications as directed. Please refer to the Current Medication list given to you today.  *If you need a refill on your cardiac medications before your next appointment, please call your pharmacy*   Lab Work:  TODAY--CMET, CBC, TSH, AND LIPIDS  If you have labs (blood work) drawn today and your tests are completely normal, you will receive your results only by: Marland Kitchen MyChart Message (if you have MyChart) OR . A paper copy in the mail If you have any lab test that is abnormal or we need to change your treatment, we will call you to review the results.   Testing/Procedures:  Your physician has requested that you have an echocardiogram. Echocardiography is a painless test that uses sound waves to create images of your heart. It provides your doctor with information about the size and shape of your heart and how well your heart's chambers and valves are working. This procedure takes approximately one hour. There are no restrictions for this procedure.  Follow-Up: At [facility], you and your health needs are our priority.  As part of our continuing mission to provide you with exceptional heart care, we have created designated Provider Care Teams.  These Care Teams include your primary Cardiologist (physician) and Advanced Practice Providers (APPs -  Physician Assistants and Nurse Practitioners) who all work together to provide you with the care you need, when you need it.  We recommend signing up for the patient portal called "MyChart".  Sign up information is provided on this After Visit Summary.  MyChart is used to connect with patients for Virtual Visits (Telemedicine).  Patients are able to view lab/test results, encounter notes, upcoming appointments, etc.  Non-urgent messages can be sent to your provider as well.   To learn more about what you can do with MyChart, go to [email or web address].    Your  next appointment:   12 month(s)  The format for your next appointment:   In Person  Provider:   Ena Dawley, MD

## 2019-08-31 ENCOUNTER — Ambulatory Visit: Payer: 59 | Attending: Internal Medicine

## 2019-08-31 DIAGNOSIS — Z23 Encounter for immunization: Secondary | ICD-10-CM

## 2019-08-31 NOTE — Progress Notes (Signed)
   Covid-19 Vaccination Clinic  Name:  KEENA HEESCH    MRN: [id number] DOB: 1960-08-09  08/31/2019  Mr. Hitchcock was observed post Covid-19 immunization for 15 minutes without incident. He was provided with Vaccine Information Sheet and instruction to access the V-Safe system.   Mr. Treiber was instructed to call 911 with any severe reactions post vaccine: Marland Kitchen Difficulty breathing  . Swelling of face and throat  . A fast heartbeat  . A bad rash all over body  . Dizziness and weakness   Immunizations Administered    Name Date Dose VIS Date Route   Moderna COVID-19 Vaccine 08/31/2019 10:39 AM 0.5 mL 02/2019 Intramuscular   Manufacturer: Moderna   Lot: [id number]   [place]: [id number]

## 2019-09-09 ENCOUNTER — Encounter: Payer: Self-pay | Admitting: Psychiatry

## 2019-09-09 ENCOUNTER — Other Ambulatory Visit: Payer: Self-pay

## 2019-09-09 ENCOUNTER — Ambulatory Visit (INDEPENDENT_AMBULATORY_CARE_PROVIDER_SITE_OTHER): Payer: 59 | Admitting: Psychiatry

## 2019-09-09 DIAGNOSIS — F4321 Adjustment disorder with depressed mood: Secondary | ICD-10-CM

## 2019-09-09 NOTE — Progress Notes (Signed)
      [facility] Counselor/Therapist Progress Note  Patient ID: [name], MRN: [id number],    Date: 09/09/2019  Time Spent: 50 minutes   Treatment Type: Individual Therapy  Reported Symptoms: irritable  Mental Status Exam:  Appearance:   Casual     Behavior:  Appropriate  Motor:  Normal  Speech/Language:   Clear and Coherent  Affect:  Appropriate  Mood:  irritable  Thought process:  normal  Thought content:    WNL  Sensory/Perceptual disturbances:    WNL  Orientation:  oriented to person, place, time/date and situation  Attention:  Good  Concentration:  Good  Memory:  WNL  Fund of knowledge:   Good  Insight:    Good  Judgment:   Good  Impulse Control:  Good   Risk Assessment: Danger to Self:  No Self-injurious Behavior: No Danger to Others: No Duty to Warn:no Physical Aggression / Violence:No  Access to Firearms a concern: No  Gang Involvement:No   Subjective: The client has been pondering what he can do to feel like he is contributing now that he is in retirement.  He has been talking to a friend of his from his former work.  His friend has 3 children 1 of whom is on the autism spectrum scale.  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  This past year with the pandemic was difficult for most school-aged children.  The client is looking forward to the challenge and does well communicating difficult concepts in ways that others understand. Client states that his wife has taken her youngest son on college tours.  I was surprised and asked the client if his stepson had actually graduated high school?  Apparently his stepson took a test online which conferred a high school diploma on him.  The client is puzzled that his wife is Engineer, production with him.  The stepson has clearly articulated that he hates school.  I asked the client if it was expected that he would pay for it?  He is unsure about that but has not been  asked.  We discussed boundaries and expectations in his marriage.  The client continues to try to engage his wife but she cuts him out from so many things.  The only positive thing that they have with each other has been their sexual relationship.  The client is not happy that there is nothing beyond this.  Interventions: Assertiveness/Communication, Motivational Interviewing, Solution-Oriented/Positive Psychology and Insight-Oriented  Diagnosis:   ICD-10-CM   1. Adjustment disorder with depressed mood  F43.21     Plan: Boundaries, assertiveness, expectations, self-care, exercise.  Robert [name], [facility]

## 2019-09-17 ENCOUNTER — Other Ambulatory Visit (HOSPITAL_COMMUNITY): Payer: 59

## 2019-09-23 ENCOUNTER — Ambulatory Visit (INDEPENDENT_AMBULATORY_CARE_PROVIDER_SITE_OTHER): Payer: 59 | Admitting: Psychiatry

## 2019-09-23 ENCOUNTER — Encounter: Payer: Self-pay | Admitting: Psychiatry

## 2019-09-23 ENCOUNTER — Other Ambulatory Visit: Payer: Self-pay

## 2019-09-23 DIAGNOSIS — F4321 Adjustment disorder with depressed mood: Secondary | ICD-10-CM

## 2019-09-23 NOTE — Progress Notes (Signed)
      [facility] Counselor/Therapist Progress Note  Patient ID: Robert [name], MRN: [id number],    Date: 09/23/2019  Time Spent: 50 minutes   Treatment Type: Individual Therapy  Reported Symptoms: sad  Mental Status Exam:  Appearance:   Casual     Behavior:  Appropriate  Motor:  Normal  Speech/Language:   Clear and Coherent  Affect:  Appropriate  Mood:  sad  Thought process:  normal  Thought content:    WNL  Sensory/Perceptual disturbances:    WNL  Orientation:  oriented to person, place, time/date and situation  Attention:  Good  Concentration:  Good  Memory:  WNL  Fund of knowledge:   Good  Insight:    Good  Judgment:   Good  Impulse Control:  Good   Risk Assessment: Danger to Self:  No Self-injurious Behavior: No Danger to Others: No Duty to Warn:no Physical Aggression / Violence:No  Access to Firearms a concern: No  Gang Involvement:No   Subjective: The client has just returned from [place] meeting with his sibling at his dad's house.  They split up his dad's possessions which went very well.  The client is sad because his dad is gone.  He saw that his brother was having more difficulty than he was.  The client talked at length about what he remembered with his dad.  He was grateful that he had the choice of different personal effects that he now has in his house.  His wife joined him in [place] to help him bring everything back in their large [place]. The client continues to struggle in his relationship with his wife.  They do well in their physical relationship but he still feels cut off from the rest of her life.  They are living separately but the client still hopes for full reconciliation.  I encouraged the client to continue his self-care as well as exercise.  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  He is looking forward to the opportunity to work with these children.  He continues to work on the concept of radical  acceptance.  Interventions: Assertiveness/Communication, Motivational Interviewing, Solution-Oriented/Positive Psychology and Insight-Oriented  Diagnosis:   ICD-10-CM   1. Adjustment disorder with depressed mood  F43.21     Plan: Radical acceptance, positive self talk, self-care, exercise, assertiveness, boundaries.  Keaton Stirewalt, [facility]

## 2019-10-06 ENCOUNTER — Ambulatory Visit (INDEPENDENT_AMBULATORY_CARE_PROVIDER_SITE_OTHER): Payer: 59 | Admitting: Psychiatry

## 2019-10-06 ENCOUNTER — Other Ambulatory Visit: Payer: Self-pay

## 2019-10-06 ENCOUNTER — Encounter: Payer: Self-pay | Admitting: Psychiatry

## 2019-10-06 DIAGNOSIS — F4321 Adjustment disorder with depressed mood: Secondary | ICD-10-CM
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
## 2019-10-08 ENCOUNTER — Ambulatory Visit (HOSPITAL_COMMUNITY): Payer: 59 | Attending: Internal Medicine

## 2019-10-08 ENCOUNTER — Ambulatory Visit (INDEPENDENT_AMBULATORY_CARE_PROVIDER_SITE_OTHER): Payer: 59

## 2019-10-08 ENCOUNTER — Other Ambulatory Visit: Payer: Self-pay

## 2019-10-08 DIAGNOSIS — Z8673 Personal history of transient ischemic attack (TIA), and cerebral infarction without residual deficits: Secondary | ICD-10-CM

## 2019-10-08 DIAGNOSIS — E785 Hyperlipidemia, unspecified: Secondary | ICD-10-CM | POA: Insufficient documentation

## 2019-10-08 DIAGNOSIS — Z7901 Long term (current) use of anticoagulants: Secondary | ICD-10-CM

## 2019-10-08 DIAGNOSIS — Z952 Presence of prosthetic heart valve: Secondary | ICD-10-CM | POA: Insufficient documentation

## 2019-10-08 LAB — POCT INR: INR: 1.8 — AB (ref 2.0–3.0)

## 2019-10-08 NOTE — Patient Instructions (Signed)
Description   Take 2 tablets today and tomorrow, then resume same dosage 1.5 tablets daily. Call coumadin clinic [phone] if you have missed a dose or had any recent changes in medications or upcoming procedures.  Recheck INR in 2 weeks.

## 2019-10-20 ENCOUNTER — Ambulatory Visit (INDEPENDENT_AMBULATORY_CARE_PROVIDER_SITE_OTHER): Payer: 59 | Admitting: Psychiatry

## 2019-10-20 ENCOUNTER — Other Ambulatory Visit: Payer: Self-pay

## 2019-10-20 DIAGNOSIS — F4321 Adjustment disorder with depressed mood: Secondary | ICD-10-CM

## 2019-10-21 ENCOUNTER — Encounter: Payer: Self-pay | Admitting: Psychiatry

## 2019-10-21 NOTE — Progress Notes (Signed)
      [facility] Counselor/Therapist Progress Note  Patient ID: Robert [name], MRN: [id number],    Date: 10/20/2019  Time Spent: 50 minutes   Treatment Type: Individual Therapy  Reported Symptoms: sad, irritable  Mental Status Exam:  Appearance:   Casual     Behavior:  Appropriate  Motor:  Normal  Speech/Language:   Clear and Coherent  Affect:  Appropriate  Mood:  irritable and sad  Thought process:  normal  Thought content:    WNL  Sensory/Perceptual disturbances:    WNL  Orientation:  oriented to person, place, time/date and situation  Attention:  Good  Concentration:  Good  Memory:  WNL  Fund of knowledge:   Good  Insight:    Good  Judgment:   Good  Impulse Control:  Good   Risk Assessment: Danger to Self:  No Self-injurious Behavior: No Danger to Others: No Duty to Warn:no Physical Aggression / Violence:No  Access to Firearms a concern: No  Gang Involvement:No   Subjective: The client states that as part of his self-care that he has started Consulting civil engineer.  He is taking them at a local airport and has found that it is extremely satisfying.  He states that his wife does not like it because she feels it will cost too much money.  Surprisingly the client states that the cost is only for the tow which is $37.  He states that what makes him most irritable has been his wife telling him the same problems over and over again.  Then he realizes that he is sad because she does not listen to anything that he says or follow any strategies that he suggests.  "It is tough to hear the same things over and over again."  The only time the client feels that he has his wife's attention is if they are taking a walk or having sex.  Otherwise she rejects anything he has to say. I asked the client what his long-term plan was since he has already been separated 2 years plus.  He states his hope is to wait it out until she retires, if she does.  He then hopes that they can spend more quality  time together.  I pointed out to the client that his wife is obsessive about her Immunologist.  He agreed.  "What are the chance that she will retire?"  He feels they will be pretty slim.  Interventions: Assertiveness/Communication, Motivational Interviewing, Solution-Oriented/Positive Psychology and Insight-Oriented  Diagnosis:   ICD-10-CM   1. Adjustment disorder with depressed mood  F43.21     Plan: Radical acceptance, self-care, exercise, positive self talk, assertiveness, boundaries.  Oriana Horiuchi, [facility]

## 2019-10-30 ENCOUNTER — Ambulatory Visit (INDEPENDENT_AMBULATORY_CARE_PROVIDER_SITE_OTHER): Payer: 59

## 2019-10-30 ENCOUNTER — Other Ambulatory Visit: Payer: Self-pay | Admitting: Cardiology

## 2019-10-30 ENCOUNTER — Other Ambulatory Visit: Payer: Self-pay

## 2019-10-30 DIAGNOSIS — Z8673 Personal history of transient ischemic attack (TIA), and cerebral infarction without residual deficits: Secondary | ICD-10-CM

## 2019-10-30 DIAGNOSIS — Z7901 Long term (current) use of anticoagulants: Secondary | ICD-10-CM

## 2019-10-30 DIAGNOSIS — Z952 Presence of prosthetic heart valve: Secondary | ICD-10-CM | POA: Diagnosis not present

## 2019-10-30 LAB — POCT INR: INR: 2.8 (ref 2.0–3.0)

## 2019-10-30 NOTE — Patient Instructions (Signed)
Continue taking 1.5 tablets daily. Call coumadin clinic [phone] if you have missed a dose or had any recent changes in medications or upcoming procedures.  Recheck INR in 4 weeks.

## 2019-11-04 ENCOUNTER — Other Ambulatory Visit: Payer: Self-pay

## 2019-11-04 ENCOUNTER — Ambulatory Visit (INDEPENDENT_AMBULATORY_CARE_PROVIDER_SITE_OTHER): Payer: 59 | Admitting: Psychiatry

## 2019-11-04 ENCOUNTER — Encounter: Payer: Self-pay | Admitting: Psychiatry

## 2019-11-04 DIAGNOSIS — F4321 Adjustment disorder with depressed mood: Secondary | ICD-10-CM

## 2019-11-04 NOTE — Progress Notes (Signed)
°      [facility] Counselor/Therapist Progress Note  Patient ID: Robert [name], MRN: [id number],    Date: 11/04/2019  Time Spent: 50 minutes   Treatment Type: Individual Therapy  Reported Symptoms: sad  Mental Status Exam:  Appearance:   Casual     Behavior:  Appropriate  Motor:  Normal  Speech/Language:   Clear and Coherent  Affect:  Appropriate  Mood:  sad  Thought process:  normal  Thought content:    WNL  Sensory/Perceptual disturbances:    WNL  Orientation:  oriented to person, place, time/date and situation  Attention:  Good  Concentration:  Good  Memory:  WNL  Fund of knowledge:   Good  Insight:    Good  Judgment:   Good  Impulse Control:  Good   Risk Assessment: Danger to Self:  No Self-injurious Behavior: No Danger to Others: No Duty to Warn:no Physical Aggression / Violence:No  Access to Firearms a concern: No  Gang Involvement:No   Subjective: The client's wife is talking about moving back in with him.  He is surprised that they have been "working" on their relationship.  The first thing she said about moving back in together is that, "he will have to buy some groceries".  The client has spent lots of money on his wife and her 6 children.  He has paid for college for those that were willing to go.  These are his stepchildren and his kindness in paying for college is phenomenal.  The client states that for them to get back together they will have to have some conversations about finances and who will pay for what.  This continues to be a point of contention between both of them.  The client is given short shrift from his stepsons.  His wife cuts him out of all decisions concerning them.  I discussed with the client that they would clearly need to have boundaries and acceptance with where they are with each other. The client's insurance from his previous job is coming to an end in September.  He discussed today what the options will be in terms of coverage for  counseling?  I explained to the client that if he has a high deductible plan he will have to pay out-of-pocket what the rate is that the insurance will pay.  This is a significant amount and the client is unsure if he can do that.  We will continue to discuss what other possible options might be available.  Interventions: Assertiveness/Communication, Motivational Interviewing, Solution-Oriented/Positive Psychology, [employer] [facility] [employer] (EMDR) and Insight-Oriented  Diagnosis:   ICD-10-CM   1. Adjustment disorder with depressed mood  F43.21     Plan: Boundaries, assertiveness, radical acceptance, discussions with wife on finances, insurance coverage for counseling.  [name], [facility]

## 2019-11-18 ENCOUNTER — Encounter: Payer: Self-pay | Admitting: Psychiatry

## 2019-11-18 ENCOUNTER — Ambulatory Visit (INDEPENDENT_AMBULATORY_CARE_PROVIDER_SITE_OTHER): Payer: 59 | Admitting: Psychiatry

## 2019-11-18 ENCOUNTER — Other Ambulatory Visit: Payer: Self-pay

## 2019-11-18 DIAGNOSIS — F4321 Adjustment disorder with depressed mood: Secondary | ICD-10-CM
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
## 2019-11-27 ENCOUNTER — Ambulatory Visit: Payer: Self-pay | Admitting: Medical

## 2019-12-02 ENCOUNTER — Other Ambulatory Visit: Payer: Self-pay

## 2019-12-02 ENCOUNTER — Telehealth: Payer: Self-pay | Admitting: Cardiology

## 2019-12-02 ENCOUNTER — Ambulatory Visit (INDEPENDENT_AMBULATORY_CARE_PROVIDER_SITE_OTHER): Payer: 59 | Admitting: Psychiatry

## 2019-12-02 ENCOUNTER — Encounter: Payer: Self-pay | Admitting: Psychiatry

## 2019-12-02 DIAGNOSIS — F4321 Adjustment disorder with depressed mood: Secondary | ICD-10-CM

## 2019-12-02 NOTE — Telephone Encounter (Signed)
Pt is calling to ask if it is safe to proceed with getting an MRI done today, and having an artificial heart valve. Informed the pt that there is no contraindication as to why he can't get his MRI done, and having a prosthetic aortic valve.  Informed him that this is safe.  Pt verbalized understanding and agrees with this plan. Pt was more than gracious for all the assistance provided.

## 2019-12-02 NOTE — Progress Notes (Signed)
      [facility] Counselor/Therapist Progress Note  Patient ID: ALEKAI POCOCK, MRN: [id number],    Date: 12/02/2019  Time Spent: 50 minutes   Treatment Type: Individual Therapy  Reported Symptoms: sadness  Mental Status Exam:  Appearance:   Casual     Behavior:  Appropriate  Motor:  Normal  Speech/Language:   Clear and Coherent  Affect:  Appropriate  Mood:  sad  Thought process:  normal  Thought content:    WNL  Sensory/Perceptual disturbances:    WNL  Orientation:  oriented to person, place, time/date and situation  Attention:  Good  Concentration:  Good  Memory:  WNL  Fund of knowledge:   Good  Insight:    Good  Judgment:   Good  Impulse Control:  Good   Risk Assessment: Danger to Self:  No Self-injurious Behavior: No Danger to Others: No Duty to Warn:no Physical Aggression / Violence:No  Access to Firearms a concern: No  Gang Involvement:No   Subjective: The client and his wife have been discussing about her moving back into his house.  She had a list of ultimatums that she gave him.  One of the first ones was that her children can come and go as they please out of the house.  His children cannot.  She also disclosed to the client that she is taking out a small business administration alone for $3 million to build a new Forensic psychologist.  She asked the client to sign over the equity in his house (600,000) as collateral for the loan.  All of this the client found overwhelming.  He is frustrated that his wife has such little acceptance of his own children.  He did state that if they caused problems for her he would asked them to leave.  He asked her if she would reciprocate that for him?  She would not answer the question.  The client has been very clear that the house that they lived in was the one that he bought with all his money and paid for with his salary.  The client is clear that he will not sign over the equity in his house or cosign on her loan.  He was  very disappointed at the end of the session realizing that this could be the end of their marriage.  She is threatened to divorce and then sue him for the money.  The client has to take a stand and knows that it will end the relationship.  Interventions: Assertiveness/Communication, Motivational Interviewing, Solution-Oriented/Positive Psychology and Insight-Oriented  Diagnosis:   ICD-10-CM   1. Adjustment disorder with depressed mood  F43.21     Plan: Boundaries, assertiveness, mood independent behavior, positive self talk, self-care, exercise, radical acceptance.  Chosen Garron, [facility]

## 2019-12-02 NOTE — Telephone Encounter (Signed)
Patient is scheduled to have an MRI today at 9:45 AM and he would like to know if this will affect prosthetic heart valve. Please advise.

## 2019-12-04 ENCOUNTER — Ambulatory Visit (INDEPENDENT_AMBULATORY_CARE_PROVIDER_SITE_OTHER): Payer: 59

## 2019-12-04 ENCOUNTER — Other Ambulatory Visit: Payer: Self-pay

## 2019-12-04 DIAGNOSIS — Z8673 Personal history of transient ischemic attack (TIA), and cerebral infarction without residual deficits: Secondary | ICD-10-CM

## 2019-12-04 DIAGNOSIS — Z952 Presence of prosthetic heart valve: Secondary | ICD-10-CM | POA: Diagnosis not present

## 2019-12-04 DIAGNOSIS — Z7901 Long term (current) use of anticoagulants: Secondary | ICD-10-CM

## 2019-12-04 LAB — POCT INR: INR: 4.2 — AB (ref 2.0–3.0)

## 2019-12-04 NOTE — Patient Instructions (Signed)
Hold today and then Continue taking 1.5 tablets daily. Call coumadin clinic [phone] if you have missed a dose or had any recent changes in medications or upcoming procedures.  Recheck INR in 2 weeks.

## 2019-12-16 ENCOUNTER — Ambulatory Visit (INDEPENDENT_AMBULATORY_CARE_PROVIDER_SITE_OTHER): Payer: 59 | Admitting: Psychiatry

## 2019-12-16 ENCOUNTER — Other Ambulatory Visit: Payer: Self-pay

## 2019-12-16 ENCOUNTER — Encounter: Payer: Self-pay | Admitting: Psychiatry

## 2019-12-16 DIAGNOSIS — F4321 Adjustment disorder with depressed mood: Secondary | ICD-10-CM | POA: Diagnosis not present

## 2019-12-16 NOTE — Progress Notes (Signed)
      [facility] Counselor/Therapist Progress Note  Patient ID: JASTEN GUYETTE, MRN: [id number],    Date: 12/16/2019  Time Spent: 50 minutes   Treatment Type: Individual Therapy  Reported Symptoms: sad, disappointment  Mental Status Exam:  Appearance:   Casual     Behavior:  Appropriate  Motor:  Normal  Speech/Language:   Clear and Coherent  Affect:  Appropriate  Mood:  sad  Thought process:  normal  Thought content:    WNL  Sensory/Perceptual disturbances:    WNL  Orientation:  oriented to person, place, time/date and situation  Attention:  Good  Concentration:  Good  Memory:  WNL  Fund of knowledge:   Good  Insight:    Good  Judgment:   Good  Impulse Control:  Good   Risk Assessment: Danger to Self:  No Self-injurious Behavior: No Danger to Others: No Duty to Warn:no Physical Aggression / Violence:No  Access to Firearms a concern: No  Gang Involvement:No   Subjective: The client brought in the list of expectations his wife had written out for him if they were to reconcile and live together.  He wanted to go over them today because he finds them problematic.  The client finds this whole episode difficult because he knows his wife communicates this from a sincere point of view.  It is just the way they view relationships so differently.  She made reference to "their children" yet she has clearly precluded him from having any say in how the children are raised or what the expectations of them should be.  Her only requirement for him is to pay what she determines is necessary for them.  "Since they do not have family money".  The client looks at her expectation and summarized it as, "I decide, you pay."  This is unacceptable to the client.  It is not that he wants to hold onto all of his money because he has been more than generous in paying for her children's secondary educations.  They have been ungrateful or dismissive of any requirements that he has had for them.  They expect  to use his vehicles or his house with impunity and their mother, the client's wife supports that.  The straw that broke the camel's back was the client's wife's expectation that he would sign off on a small business administration alone that she had negotiated but did not consult with him on.  She wanted him to put his assets as collateral against the loan.  The client is unwilling to do that.  Since that time she has not contacted him or allowed him to stay over at her house.  I asked the client what his next steps were?  He stated that he thought his wife would take the next steps by filing for divorce.  Interventions: Assertiveness/Communication, Motivational Interviewing, Solution-Oriented/Positive Psychology and Insight-Oriented  Diagnosis:   ICD-10-CM   1. Adjustment disorder with depressed mood  F43.21     Plan: Mood independent behavior, assertiveness, boundaries, self-care, positive self talk, radical acceptance.  Thadd Apuzzo, [facility]

## 2019-12-28 ENCOUNTER — Ambulatory Visit (INDEPENDENT_AMBULATORY_CARE_PROVIDER_SITE_OTHER): Payer: 59

## 2019-12-28 ENCOUNTER — Other Ambulatory Visit: Payer: Self-pay

## 2019-12-28 DIAGNOSIS — Z7901 Long term (current) use of anticoagulants: Secondary | ICD-10-CM | POA: Diagnosis not present

## 2019-12-28 DIAGNOSIS — Z952 Presence of prosthetic heart valve: Secondary | ICD-10-CM | POA: Diagnosis not present

## 2019-12-28 DIAGNOSIS — Z8673 Personal history of transient ischemic attack (TIA), and cerebral infarction without residual deficits: Secondary | ICD-10-CM | POA: Diagnosis not present

## 2019-12-28 LAB — POCT INR: INR: 4.1 — AB (ref 2.0–3.0)

## 2019-12-28 NOTE — Patient Instructions (Signed)
Hold today and then Continue taking 1.5 tablets daily. Call coumadin clinic [phone] if you have missed a dose or had any recent changes in medications or upcoming procedures.  Recheck INR in 2 weeks. Eat consistent amount of greens.

## 2019-12-30 ENCOUNTER — Ambulatory Visit (INDEPENDENT_AMBULATORY_CARE_PROVIDER_SITE_OTHER): Payer: 59 | Admitting: Psychiatry

## 2019-12-30 ENCOUNTER — Encounter: Payer: Self-pay | Admitting: Psychiatry

## 2019-12-30 ENCOUNTER — Other Ambulatory Visit: Payer: Self-pay

## 2019-12-30 DIAGNOSIS — F4321 Adjustment disorder with depressed mood: Secondary | ICD-10-CM | POA: Diagnosis not present

## 2019-12-30 NOTE — Progress Notes (Signed)
      [facility] Counselor/Therapist Progress Note  Patient ID: Robert [name], MRN: [id number],    Date: 12/30/2019  Time Spent: 50 minutes   Treatment Type: Individual Therapy  Reported Symptoms: sad, irritable  Mental Status Exam:  Appearance:   Well Groomed     Behavior:  Appropriate  Motor:  Normal  Speech/Language:   Clear and Coherent  Affect:  Appropriate  Mood:  irritable and sad  Thought process:  normal  Thought content:    WNL  Sensory/Perceptual disturbances:    WNL  Orientation:  oriented to person, place, time/date and situation  Attention:  Good  Concentration:  Good  Memory:  WNL  Fund of knowledge:   Good  Insight:    Good  Judgment:   Good  Impulse Control:  Good   Risk Assessment: Danger to Self:  No Self-injurious Behavior: No Danger to Others: No Duty to Warn:no Physical Aggression / Violence:No  Access to Firearms a concern: No  Gang Involvement:No   Subjective: "Things have been terrible."  The client states that he has been getting thousand word texts from his wife at 4:00 in the morning.  She is ranting about the fact that he will not sign off on her small business administration loan for $3 million.  It would require the client putting up his house as collateral.  This is a house that he bought with premarital funds that his wife feels she is entitled to half of.  She is angry that he will not cooperate.  The client has good reason to demure.  She has not always made the best decisions.  Many times they are made in the moment impulsively.  The client finds himself getting more irritated.  She has threatened to sue for divorce.  He states he Robert [name] also be on board. She also pressures the client to include her children and his will.  She would like him to leave them monies.  The client balks at this.  He states he has paid from many of them to go to college with various amounts of success.  She will not allow him to make any decisions concerning them or  parenting choices.  She just expects him to pay for them.  The client finds this unsuitable.  I use the bilateral stimulation hand paddles with the client to help him process as he was speaking. The client described many circumstances that reinforces his decision not signing off on the loan as a wise move.  He will continue to weigh his options.  Right now he is leaning towards divorce.   Interventions: Assertiveness/Communication, Motivational Interviewing, Solution-Oriented/Positive Psychology, [employer] Desensitization and Reprocessing (EMDR) and Insight-Oriented  Diagnosis:   ICD-10-CM   1. Adjustment disorder with depressed mood  F43.21     Plan: Mood independent behavior, positive self talk, self-care, assertiveness, boundaries.  Robert [name], [facility]

## 2020-01-11 ENCOUNTER — Ambulatory Visit (INDEPENDENT_AMBULATORY_CARE_PROVIDER_SITE_OTHER): Payer: 59

## 2020-01-11 ENCOUNTER — Other Ambulatory Visit: Payer: Self-pay

## 2020-01-11 DIAGNOSIS — Z8673 Personal history of transient ischemic attack (TIA), and cerebral infarction without residual deficits: Secondary | ICD-10-CM

## 2020-01-11 DIAGNOSIS — Z7901 Long term (current) use of anticoagulants: Secondary | ICD-10-CM

## 2020-01-11 DIAGNOSIS — Z952 Presence of prosthetic heart valve: Secondary | ICD-10-CM | POA: Diagnosis not present

## 2020-01-11 LAB — POCT INR: INR: 2.6 (ref 2.0–3.0)

## 2020-01-11 NOTE — Patient Instructions (Signed)
Continue taking 1.5 tablets daily. Call coumadin clinic [phone] if you have missed a dose or had any recent changes in medications or upcoming procedures.  Recheck INR in 4 weeks. Eat consistent amount of greens.

## 2020-01-13 ENCOUNTER — Other Ambulatory Visit: Payer: Self-pay

## 2020-01-13 ENCOUNTER — Ambulatory Visit (INDEPENDENT_AMBULATORY_CARE_PROVIDER_SITE_OTHER): Payer: 59 | Admitting: Psychiatry

## 2020-01-13 ENCOUNTER — Encounter: Payer: Self-pay | Admitting: Psychiatry

## 2020-01-13 DIAGNOSIS — F4321 Adjustment disorder with depressed mood: Secondary | ICD-10-CM | POA: Diagnosis not present

## 2020-01-13 NOTE — Progress Notes (Signed)
      [facility] Counselor/Therapist Progress Note  Patient ID: Robert [name], MRN: [id number],    Date: 01/13/2020  Time Spent: 50 minutes   Treatment Type: Individual Therapy  Reported Symptoms: sad  Mental Status Exam:  Appearance:   Casual     Behavior:  Appropriate  Motor:  Normal  Speech/Language:   Clear and Coherent  Affect:  Appropriate  Mood:  sad  Thought process:  normal  Thought content:    WNL  Sensory/Perceptual disturbances:    WNL  Orientation:  oriented to person, place, time/date and situation  Attention:  Good  Concentration:  Good  Memory:  WNL  Fund of knowledge:   Good  Insight:    Good  Judgment:   Good  Impulse Control:  Good   Risk Assessment: Danger to Self:  No Self-injurious Behavior: No Danger to Others: No Duty to Warn:no Physical Aggression / Violence:No  Access to Firearms a concern: No  Gang Involvement:No   Subjective: The client states that his wife's confidence in him has grown some.  He has helped her out with her car and been supportive in a number of ways.  They have not had an intimate relationship since August.  That recently happened and the client states it really helped the nature of their relationship.  The client wants to help her son who is going to nursing school at [employer] pay for his college.  He has money in 529's that could be helpful.  He has some ideas about how that money could be moved into his stepsons fund.  He had asked the stepson to call him to discuss this issue.  He found out later that his wife had talked to her son and talked him out of getting back to him.  "He will never pay for your school."  This puzzled the client but he feels like her motivation is to be the only one that is the hero. As the client continued to discuss his relationship with his wife the main issues come down to the client not spending his money that he earned or inherited in the way that his wife wants.  Usually that means spending it on  her children in ways that the client feels is unwise.  The client continues to hold his ground.  I pointed out to the client that he and his wife are coming from very different philosophical orientations.  He agreed but because she is his wife he wants to persist in pursuing her.  Interventions: Assertiveness/Communication, Motivational Interviewing, Solution-Oriented/Positive Psychology, [employer] Desensitization and Reprocessing (EMDR) and Insight-Oriented  Diagnosis:   ICD-10-CM   1. Adjustment disorder with depressed mood  F43.21     Plan: Boundaries, assertiveness, clear communication, self-care, exercise, positive self talk, radical acceptance.  Kingdom Vanzanten, [facility]

## 2020-01-27 ENCOUNTER — Ambulatory Visit (INDEPENDENT_AMBULATORY_CARE_PROVIDER_SITE_OTHER): Payer: 59 | Admitting: Psychiatry

## 2020-01-27 ENCOUNTER — Encounter: Payer: Self-pay | Admitting: Psychiatry

## 2020-01-27 ENCOUNTER — Other Ambulatory Visit: Payer: Self-pay

## 2020-01-27 DIAGNOSIS — F4321 Adjustment disorder with depressed mood: Secondary | ICD-10-CM | POA: Diagnosis not present

## 2020-01-27 NOTE — Progress Notes (Signed)
      [facility] Counselor/Therapist Progress Note  Patient ID: BRITTON BERA, MRN: [id number],    Date: 01/27/2020  Time Spent: 50 minutes   Treatment Type: Individual Therapy  Reported Symptoms: irritation, frustration  Mental Status Exam:  Appearance:   Well Groomed     Behavior:  Appropriate  Motor:  Normal  Speech/Language:   Clear and Coherent  Affect:  Appropriate  Mood:  irritable  Thought process:  normal  Thought content:    WNL  Sensory/Perceptual disturbances:    WNL  Orientation:  oriented to person, place, time/date and situation  Attention:  Good  Concentration:  Good  Memory:  WNL  Fund of knowledge:   Good  Insight:    Good  Judgment:   Good  Impulse Control:  Good   Risk Assessment: Danger to Self:  No Self-injurious Behavior: No Danger to Others: No Duty to Warn:no Physical Aggression / Violence:No  Access to Firearms a concern: No  Gang Involvement:No   Subjective: The client is irritated and frustrated today.  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  His wife tells the client that everyone else has great confidence in her business plan.  The client does not disagree that it is not a good idea but the $3 million price tag is a bit much.  The client points out that all the people that agree with her are also people that stand to make quite a bit of money off her loan.  She requires that he cosign's with her by putting his paid for house up as collateral.  His wife chides him because he has money and she does not.  The client has worked hard and made wise investments over the years.  He has also inherited some money.  She feels since he has money he should put it down for her business idea.  The client has good reasons why he will not do this.  He pointed out that the cost to service the loan alone would be $30,000 a month.  Everything would have to go as planned with no hiccups along the way.  If she became  injured or disabled in any way the whole thing would fail.  She is 59 years old and already has joint pain that she complains of.  The client has seen that she has not always used her money wisely.  She has already spent $20,000 on just planning this business.  The client is overwhelmed with that thought.  Today I used the bilateral stimulation hand paddles to help reduce the clients irritation with what is going on.  As he processed he realized that he had to trust his judgment even if it meant that he would lose his marriage.  I pointed out to the client that she has always made him spending money on her family as a litmus test on whether or not he loved her.  The client agreed.  He will stand firm.  Interventions: Assertiveness/Communication, Motivational Interviewing, Solution-Oriented/Positive Psychology, [employer] Desensitization and Reprocessing (EMDR) and Insight-Oriented  Diagnosis:   ICD-10-CM   1. Adjustment disorder with depressed mood  F43.21     Plan: Mood independent behavior, positive self talk, self-care, exercise, boundaries, assertiveness.  Britt Theard, [facility]

## 2020-02-08 ENCOUNTER — Ambulatory Visit (INDEPENDENT_AMBULATORY_CARE_PROVIDER_SITE_OTHER): Payer: 59

## 2020-02-08 DIAGNOSIS — Z7901 Long term (current) use of anticoagulants: Secondary | ICD-10-CM | POA: Diagnosis not present

## 2020-02-08 DIAGNOSIS — Z952 Presence of prosthetic heart valve: Secondary | ICD-10-CM | POA: Diagnosis not present

## 2020-02-08 DIAGNOSIS — Z8673 Personal history of transient ischemic attack (TIA), and cerebral infarction without residual deficits: Secondary | ICD-10-CM | POA: Diagnosis not present

## 2020-02-08 LAB — POCT INR: INR: 2.6 (ref 2.0–3.0)

## 2020-02-08 NOTE — Patient Instructions (Signed)
Continue taking 1.5 tablets daily. Call coumadin clinic [phone] if you have missed a dose or had any recent changes in medications or upcoming procedures.  Recheck INR in 6 weeks.    

## 2020-02-10 ENCOUNTER — Other Ambulatory Visit: Payer: Self-pay

## 2020-02-10 ENCOUNTER — Ambulatory Visit (INDEPENDENT_AMBULATORY_CARE_PROVIDER_SITE_OTHER): Payer: 59 | Admitting: Psychiatry

## 2020-02-10 ENCOUNTER — Encounter: Payer: Self-pay | Admitting: Psychiatry

## 2020-02-10 DIAGNOSIS — F4321 Adjustment disorder with depressed mood: Secondary | ICD-10-CM | POA: Diagnosis not present

## 2020-02-10 NOTE — Progress Notes (Signed)
°      [facility] Counselor/Therapist Progress Note  Patient ID: Robert [name], MRN: [id number],    Date: 02/10/2020  Time Spent: 50 minutes   Treatment Type: Individual Therapy  Reported Symptoms: sad  Mental Status Exam:  Appearance:   Well Groomed     Behavior:  Appropriate  Motor:  Normal  Speech/Language:   Clear and Coherent  Affect:  Appropriate  Mood:  sad  Thought process:  normal  Thought content:    WNL  Sensory/Perceptual disturbances:    WNL  Orientation:  oriented to person, place, time/date and situation  Attention:  Good  Concentration:  Good  Memory:  WNL  Fund of knowledge:   Good  Insight:    Good  Judgment:   Good  Impulse Control:  Good   Risk Assessment: Danger to Self:  No Self-injurious Behavior: No Danger to Others: No Duty to Warn:no Physical Aggression / Violence:No  Access to Firearms a concern: No  Gang Involvement:No   Subjective: The client states that he and his wife had a short trip to [place], [place].  She was doing some continuing education.  "I joined her and we had a lovely time.  She still wants me to sign over my house for her business though."  This makes the client very sad.  He has asked his wife for a balance sheets and other financial documents to evaluate what her plan is so he can determine if he wants to invest.  He states she keeps telling him that everyone else thinks it is a great idea why do not you?  The implication for the client is that he is either very stingy or mean-spirited.  This hurts the client because that is not true he actually has been quite generous to his stepchildren being willing to pay for their college education out of his own funds.  He has no idea how she plans to make her vet clinic successful.  He has no idea if she is hiring more veterinarians or if it is just her?  As long as she continues to be opaque the client cannot make an investment. Otherwise the client states he and his wife get  along well.  She has threatened that if he does not sign she will follow-up for divorce and go for it in the property settlement.  This also makes the client very sad as well.  I discussed with the client that he has to have radical acceptance with the current circumstances.  There is really nothing he can do or say that is going to change things unless he capitulates and signs over his house.  The client is unwilling to do that.  Every financial person he has talked to supports him.  I encouraged the client to continue with his exercise and mood independent behavior as he works on the radical acceptance in these current circumstances.  Interventions: Assertiveness/Communication, Motivational Interviewing, Solution-Oriented/Positive Psychology, [employer] Desensitization and Reprocessing (EMDR) and Insight-Oriented  Diagnosis:   ICD-10-CM   1. Adjustment disorder with depressed mood  F43.21     Plan: Mood independent behavior, assertiveness, boundaries, radical acceptance, self-care, exercise, positive self talk.  Mathew Postiglione, [facility]

## 2020-02-26 ENCOUNTER — Encounter: Payer: Self-pay | Admitting: Psychiatry

## 2020-02-26 ENCOUNTER — Other Ambulatory Visit: Payer: Self-pay

## 2020-02-26 ENCOUNTER — Ambulatory Visit (INDEPENDENT_AMBULATORY_CARE_PROVIDER_SITE_OTHER): Payer: 59 | Admitting: Psychiatry

## 2020-02-26 DIAGNOSIS — F4321 Adjustment disorder with depressed mood: Secondary | ICD-10-CM | POA: Diagnosis not present

## 2020-02-26 NOTE — Progress Notes (Signed)
      [facility] Counselor/Therapist Progress Note  Patient ID: Robert [name], MRN: [id number],    Date: 02/26/2020  Time Spent: 50 minutes   Treatment Type: Individual Therapy  Reported Symptoms: sad, irritable  Mental Status Exam:  Appearance:   Casual     Behavior:  Appropriate  Motor:  Normal  Speech/Language:   Clear and Coherent  Affect:  Appropriate  Mood:  irritable and sad  Thought process:  normal  Thought content:    WNL  Sensory/Perceptual disturbances:    WNL  Orientation:  oriented to person, place, time/date and situation  Attention:  Good  Concentration:  Good  Memory:  WNL  Fund of knowledge:   Good  Insight:    Good  Judgment:   Good  Impulse Control:  Good   Risk Assessment: Danger to Self:  No Self-injurious Behavior: No Danger to Others: No Duty to Warn:no Physical Aggression / Violence:No  Access to Firearms a concern: No  Gang Involvement:No   Subjective: The client states he still is struggling with his relationship with his wife.  They have been separated now for over 2 years.  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  The client does not think it is a good idea financially.  She has been critical of the client as a  Husband telling him, "well, you were a good provider."  Not acknowledging any other things that he did in the course of their marriage.  He discussed how he had wanted to have a blended family from the beginning but she never would allow him to parent her children.  The client has paid for those that wanted to go to college and he is also done quite a bit of work for her to help out. The client is very irritated that his wife tells him that others think her plan is a great idea and they all tell her how terrible the client is for not supporting her.  The client states that she has never shown him any financial numbers.  She told him that she has shown other  people the numbers and they think it is a great idea.  This infuriates the client because she is asking him to make a large financial investment without knowing what he is getting into.  He also he also was told by his wife that she is filing for divorce and she will sue him for the money.  This makes the client very sad.  He is tried hard to make his relationship work.  He has retained a lawyer for when he gets served the papers. Today I used the bilateral stimulation hand paddles with the client to help him process his sadness and irritability with what is going on with his wife.  We discussed radical acceptance and the need to practice mood independent behavior so that he does not express his irritability as anger.  He agrees.  Interventions: Assertiveness/Communication, Motivational Interviewing, Solution-Oriented/Positive Psychology, [employer] Desensitization and Reprocessing (EMDR) and Insight-Oriented  Diagnosis:   ICD-10-CM   1. Adjustment disorder with depressed mood  F43.21     Plan: Mood independent behavior, positive self talk, self-care, radical acceptance, assertiveness, boundaries.  Angelica Wix, [facility]

## 2020-03-07 ENCOUNTER — Other Ambulatory Visit: Payer: Self-pay | Admitting: Cardiology

## 2020-03-08 ENCOUNTER — Ambulatory Visit (INDEPENDENT_AMBULATORY_CARE_PROVIDER_SITE_OTHER): Payer: 59 | Admitting: Psychiatry

## 2020-03-08 ENCOUNTER — Other Ambulatory Visit: Payer: Self-pay

## 2020-03-08 DIAGNOSIS — F4321 Adjustment disorder with depressed mood: Secondary | ICD-10-CM

## 2020-03-09 ENCOUNTER — Encounter: Payer: Self-pay | Admitting: Psychiatry

## 2020-03-09 NOTE — Progress Notes (Signed)
      [facility] Counselor/Therapist Progress Note  Patient ID: Robert [name], MRN: [id number],    Date: 03/08/2020  Time Spent: 50 minutes   Treatment Type: Individual Therapy  Reported Symptoms: sad, irritated  Mental Status Exam:  Appearance:   Well Groomed     Behavior:  Appropriate  Motor:  Normal  Speech/Language:   Clear and Coherent  Affect:  Appropriate  Mood:  irritable and sad  Thought process:  normal  Thought content:    WNL  Sensory/Perceptual disturbances:    WNL  Orientation:  oriented to person, place, time/date and situation  Attention:  Good  Concentration:  Good  Memory:  WNL  Fund of knowledge:   Good  Insight:    Good  Judgment:   Good  Impulse Control:  Good   Risk Assessment: Danger to Self:  No Self-injurious Behavior: No Danger to Others: No Duty to Warn:no Physical Aggression / Violence:No  Access to Firearms a concern: No  Gang Involvement:No   Subjective: The client's continues to struggle in his relationship with his wife.  It frustrates him immensely because he really enjoys being around her and finds her to be very sweet and endearing.  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  She wants him to use his current home as collateral.  She says it is half of hers and she deserves it.  He pointed out that he bought the house with premarital money and used his income to pay the mortgage.  He had talked to his lawyer who felt he was on pretty solid legal ground that she could not sue him for it.  In addition the client needs to buy healthcare insurance before the end of the enrollment.  He had an HSA which his wife ran dry with her children but she has refused to contribute any more money to it.  She states, "we are not married anymore."  This really hurts the client's feelings.  He expects any day to receive divorce papers.  He is not going to file them himself yet.   "I am trying to look forward."  He understands that he cannot make the financial decision that she wants to because he feels it is foolish.  The whole circumstance makes him feel sad.  He is working on radical acceptance with that.  Interventions: Assertiveness/Communication, Motivational Interviewing, Solution-Oriented/Positive Psychology and Insight-Oriented  Diagnosis:   ICD-10-CM   1. Adjustment disorder with depressed mood  F43.21     Plan: Evaluate if he should file for divorce, positive self talk, self-care, exercise, assertiveness, radical acceptance, boundaries.  Teejay Meader, [facility]

## 2020-03-14 ENCOUNTER — Ambulatory Visit: Payer: 59 | Admitting: Psychiatry

## 2020-03-21 ENCOUNTER — Ambulatory Visit (INDEPENDENT_AMBULATORY_CARE_PROVIDER_SITE_OTHER): Payer: 59

## 2020-03-21 ENCOUNTER — Other Ambulatory Visit: Payer: Self-pay

## 2020-03-21 DIAGNOSIS — Z952 Presence of prosthetic heart valve: Secondary | ICD-10-CM

## 2020-03-21 DIAGNOSIS — Z7901 Long term (current) use of anticoagulants: Secondary | ICD-10-CM

## 2020-03-21 DIAGNOSIS — Z8673 Personal history of transient ischemic attack (TIA), and cerebral infarction without residual deficits: Secondary | ICD-10-CM | POA: Diagnosis not present

## 2020-03-21 LAB — POCT INR: INR: 4.2 — AB (ref 2.0–3.0)

## 2020-03-21 NOTE — Patient Instructions (Signed)
Hold tonight only and then Continue taking 1.5 tablets daily. Call coumadin clinic [phone] if you have missed a dose or had any recent changes in medications or upcoming procedures.  Recheck INR in 6 weeks.

## 2020-04-13 ENCOUNTER — Other Ambulatory Visit: Payer: Self-pay

## 2020-04-13 ENCOUNTER — Encounter: Payer: Self-pay | Admitting: Psychiatry

## 2020-04-13 ENCOUNTER — Ambulatory Visit (INDEPENDENT_AMBULATORY_CARE_PROVIDER_SITE_OTHER): Payer: 59 | Admitting: Psychiatry

## 2020-04-13 DIAGNOSIS — F4321 Adjustment disorder with depressed mood: Secondary | ICD-10-CM | POA: Diagnosis not present

## 2020-04-13 NOTE — Progress Notes (Signed)
      [facility] Counselor/Therapist Progress Note  Patient ID: Robert [name], Robert [name],    Date: 04/13/2020  Time Spent: 50 minutes   Treatment Type: Individual Therapy  Reported Symptoms: sad, irritated  Mental Status Exam:  Appearance:   Casual     Behavior:  Appropriate  Motor:  Normal  Speech/Language:   Clear and Coherent  Affect:  Appropriate  Mood:  irritable and sad  Thought process:  normal  Thought content:    WNL  Sensory/Perceptual disturbances:    WNL  Orientation:  oriented to person, place, time/date and situation  Attention:  Good  Concentration:  Good  Memory:  WNL  Fund of knowledge:   Good  Insight:    Good  Judgment:   Good  Impulse Control:  Good   Risk Assessment: Danger to Self:  No Self-injurious Behavior: No Danger to Others: No Duty to Warn:no Physical Aggression / Violence:No  Access to Firearms a concern: No  Gang Involvement:No   Subjective: Client states that he has seen his wife very little.  He had invited his wife and her children over for a Christmas eve dinner.  He had asked everyone to be there by 6 PM so he could serve at 7 PM.  All of his stepchildren were late.  His wife showed up at 8:30 PM.  His youngest stepson did not show up at all.  The client had made an extraordinarily nice dinner (beef [place]) so it felt very disrespectful to him at how he was treated. I asked the client what this indicated for him going forward?  He still expects his wife to be filing separation and divorce papers with him soon.  It is made with the client very sad that things Robert [name] be coming to an end.  He was also very irritable and how he was treated over Christmas Eve.  On top of that the client contracted the [place].  He states he did fairly well.  He described that his symptoms were mild and since he was by himself he was able to rest. We discussed the need to set further boundaries with his wife and stepchildren.  The client  states that he will not go out of his way to provide dinner for them.  He is unsure what the other boundaries might look like.  Interventions: Assertiveness/Communication, Motivational Interviewing, Solution-Oriented/Positive Psychology and Insight-Oriented  Diagnosis:   ICD-10-CM   1. Adjustment disorder with depressed mood  F43.21     Plan: Boundaries, assertiveness, self-care, exercise, positive self talk.  Robert [name], [facility]

## 2020-04-27 ENCOUNTER — Other Ambulatory Visit: Payer: Self-pay

## 2020-04-27 ENCOUNTER — Encounter: Payer: Self-pay | Admitting: Psychiatry

## 2020-04-27 ENCOUNTER — Ambulatory Visit (INDEPENDENT_AMBULATORY_CARE_PROVIDER_SITE_OTHER): Payer: 59 | Admitting: Psychiatry

## 2020-04-27 DIAGNOSIS — F4321 Adjustment disorder with depressed mood: Secondary | ICD-10-CM | POA: Diagnosis not present

## 2020-04-27 NOTE — Progress Notes (Unsigned)
[facility] Counselor/Therapist Progress Note  Patient ID: Robert [name], MRN: [id number],    Date: 04/27/2020  Time Spent: 50 minutes   Treatment Type: Individual Therapy  Reported Symptoms: irritable  Mental Status Exam:  Appearance:   Well Groomed     Behavior:  Appropriate  Motor:  Normal  Speech/Language:   Clear and Coherent  Affect:  Appropriate  Mood:  irritable  Thought process:  normal  Thought content:    WNL  Sensory/Perceptual disturbances:    WNL  Orientation:  oriented to person, place, time/date and situation  Attention:  Good  Concentration:  Good  Memory:  WNL  Fund of knowledge:   Good  Insight:    Good  Judgment:   Good  Impulse Control:  Good   Risk Assessment: Danger to Self:  No Self-injurious Behavior: No Danger to Others: No Duty to Warn:no Physical Aggression / Violence:No  Access to Firearms a concern: No  Gang Involvement:No   Subjective: The client states that his wife has decided that something terrible had to have happened to him as a child to make him so angry.  I was puzzled since I have worked with the client off and on for years and there was never any childhood trauma that I was aware of.  The client's wife is very codependent and overly involved with her children.  She has blocked him from being any kind of parent to them over the 10 years of their marriage.  His understanding was that they would work together as partners raising the children.  When it came to setting boundaries with them especially around disrespect the client's wife would block any boundary he tried to set.  As a result the client could get very angry when he was treated in a disrespectful way by one of her children. 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  She states they cannot conceive why he would not want to put his house up as collateral and invest in her business?  The client  does not think that she would not be able to make it work but she will not allow him to be part of overseeing any of the financial part of the operation.  As a result the client does not feel that it would be wise for him to put his whole house up and then possibly lose it. As we continued to discuss this the client feels the message to him is that, "I am a bad husband."  He feels disparaged and disrespected.  He also notes that he feels betrayed by his wife by her attitude and behavior towards him.  I pointed out to the client that being angry at someone for being disrespectful or betraying them would be a normal response.  I asked the client in his anger has he assaulted anyone?  He states he has not.  He has not threatened anyone either.  I suggested that the client and his wife are in different books and on separate pages concerning how they see life.  The client reluctantly agreed.  Interventions: Assertiveness/Communication, Solution-Oriented/Positive Psychology and Insight-Oriented  Diagnosis:   ICD-10-CM   1. Adjustment disorder with depressed mood  F43.21     Plan: Mood independent behavior, positive self talk, assertiveness, boundaries, radical acceptance, self-care, exercise.  Jacklyn Branan, [facility]

## 2020-05-02 ENCOUNTER — Ambulatory Visit (INDEPENDENT_AMBULATORY_CARE_PROVIDER_SITE_OTHER): Payer: 59

## 2020-05-02 ENCOUNTER — Other Ambulatory Visit: Payer: Self-pay

## 2020-05-02 DIAGNOSIS — Z8673 Personal history of transient ischemic attack (TIA), and cerebral infarction without residual deficits: Secondary | ICD-10-CM

## 2020-05-02 DIAGNOSIS — Z7901 Long term (current) use of anticoagulants: Secondary | ICD-10-CM | POA: Diagnosis not present

## 2020-05-02 DIAGNOSIS — Z952 Presence of prosthetic heart valve: Secondary | ICD-10-CM

## 2020-05-02 LAB — POCT INR: INR: 2.5 (ref 2.0–3.0)

## 2020-05-02 NOTE — Patient Instructions (Signed)
Continue taking 1.5 tablets daily. Call coumadin clinic [phone] if you have missed a dose or had any recent changes in medications or upcoming procedures.  Recheck INR in 6 weeks.    

## 2020-05-10 ENCOUNTER — Encounter: Payer: Self-pay | Admitting: Psychiatry

## 2020-05-10 ENCOUNTER — Ambulatory Visit (INDEPENDENT_AMBULATORY_CARE_PROVIDER_SITE_OTHER): Payer: 59 | Admitting: Psychiatry

## 2020-05-10 ENCOUNTER — Other Ambulatory Visit: Payer: Self-pay

## 2020-05-10 DIAGNOSIS — F4321 Adjustment disorder with depressed mood: Secondary | ICD-10-CM

## 2020-05-10 NOTE — Progress Notes (Signed)
      [facility] Counselor/Therapist Progress Note  Patient ID: Robert [name], MRN: [id number],    Date: 05/10/2020  Time Spent: 50 minutes   Treatment Type: Individual Therapy  Reported Symptoms: sad  Mental Status Exam:  Appearance:   Casual and Well Groomed     Behavior:  Appropriate  Motor:  Normal  Speech/Language:   Clear and Coherent  Affect:  Appropriate  Mood:  sad  Thought process:  normal  Thought content:    WNL  Sensory/Perceptual disturbances:    WNL  Orientation:  oriented to person, place, time/date and situation  Attention:  Good  Concentration:  Good  Memory:  WNL  Fund of knowledge:   Good  Insight:    Good  Judgment:   Good  Impulse Control:  Good   Risk Assessment: Danger to Self:  No Self-injurious Behavior: No Danger to Others: No Duty to Warn:no Physical Aggression / Violence:No  Access to Firearms a concern: No  Gang Involvement:No   Subjective: The client states that a childhood friend died unexpectedly.  He went to the funeral in [place] and his wife flew down to be with him.  "We still get along but to part ways when it comes to money."  The client agrees that he is a great boyfriend but that is where it stops.  She wants him to pay for things but be a "silent partner".  The client finds this unacceptable which continues to cause some difficulties.  He states that they still connect well intimately although his wife complains that he is taking advantage of her.  The client questions this and asks her how that is happening since she is the one that invites him over and initiate things?  She reluctantly agrees that sometimes she changes her mind. The client has floated the idea with his wife that he plans on changing his will.  This means that she will not inherit everything as originally planned.  He would leave everything to his sons.  The client is still hopeful they can come to some kind of understanding.  She continues to be adamant about the  finances which the client is unwilling to get into.    Interventions: Assertiveness/Communication, Motivational Interviewing, Solution-Oriented/Positive Psychology and Insight-Oriented  Diagnosis:   ICD-10-CM   1. Adjustment disorder with depressed mood  F43.21     Plan: Mood independent behavior, assertiveness, boundaries, self-care, redo wll.  Candy Ziegler, [facility]

## 2020-05-17 ENCOUNTER — Ambulatory Visit: Payer: 59 | Admitting: Psychiatry

## 2020-05-27 ENCOUNTER — Encounter: Payer: Self-pay | Admitting: Psychiatry

## 2020-05-27 ENCOUNTER — Ambulatory Visit (INDEPENDENT_AMBULATORY_CARE_PROVIDER_SITE_OTHER): Payer: 59 | Admitting: Psychiatry

## 2020-05-27 ENCOUNTER — Other Ambulatory Visit: Payer: Self-pay

## 2020-05-27 DIAGNOSIS — F4321 Adjustment disorder with depressed mood: Secondary | ICD-10-CM | POA: Diagnosis not present

## 2020-05-27 NOTE — Progress Notes (Signed)
      [facility] Counselor/Therapist Progress Note  Patient ID: Robert [name], MRN: [id number],    Date: 05/27/2020  Time Spent: 50 minutes   Treatment Type: Individual Therapy  Reported Symptoms: sad  Mental Status Exam:  Appearance:   Well Groomed     Behavior:  Appropriate  Motor:  Normal  Speech/Language:   Clear and Coherent  Affect:  Appropriate  Mood:  sad  Thought process:  normal  Thought content:    WNL  Sensory/Perceptual disturbances:    WNL  Orientation:  oriented to person, place, time/date and situation  Attention:  Good  Concentration:  Good  Memory:  WNL  Fund of knowledge:   Good  Insight:    Good  Judgment:   Good  Impulse Control:  Good   Risk Assessment: Danger to Self:  No Self-injurious Behavior: No Danger to Others: No Duty to Warn:no Physical Aggression / Violence:No  Access to Firearms a concern: No  Gang Involvement:No   Subjective: The client attended his neighbor's funeral today.  His wife went with him which was a positive interaction.  The evening before the client had gone to [place], [place] to attend a lecture.  He had asked his wife to go with him in December when he bought the tickets.  She had not committed.  Once he went yesterday she was upset that he had gone without her.  This confuse the client.  He also spent some time after the lecture having a conversation with a younger man who was sitting next to him.  His wife was annoyed at this.  Although he did not spend the night with his wife she accused him of being bisexual.  This completely threw the client since that has never been on his radar in his life or his marriage.  He was puzzled as to why she would say what she did.  He states they continue to have good sexual interactions.  Beyond that she is critical and dismissive of him.  They are at a 3-year point of being separated.  I asked the client how much longer is he going to let this drag out?  He is unsure.  He thought that  his wife was on the verge of serving him papers.  At this point he wants to just sit tight.  Interventions: Assertiveness/Communication, Motivational Interviewing, Solution-Oriented/Positive Psychology and Insight-Oriented  Diagnosis:   ICD-10-CM   1. Adjustment disorder with depressed mood  F43.21     Plan: Mood independent behavior, positive self talk, self-care, exercise, assertiveness, boundaries.  Evalise Abruzzese, [facility]

## 2020-06-03 ENCOUNTER — Ambulatory Visit: Payer: 59 | Admitting: Psychiatry

## 2020-06-15 ENCOUNTER — Encounter: Payer: Self-pay | Admitting: Psychiatry

## 2020-06-15 ENCOUNTER — Other Ambulatory Visit: Payer: Self-pay

## 2020-06-15 ENCOUNTER — Ambulatory Visit (INDEPENDENT_AMBULATORY_CARE_PROVIDER_SITE_OTHER): Payer: 59 | Admitting: Psychiatry

## 2020-06-15 DIAGNOSIS — F4321 Adjustment disorder with depressed mood: Secondary | ICD-10-CM

## 2020-06-15 NOTE — Progress Notes (Signed)
      [facility] Counselor/Therapist Progress Note  Patient ID: Robert [name], MRN: [id number],    Date: 06/15/2020  Time Spent: 45 minutes   Treatment Type: Individual Therapy  Reported Symptoms: sadness  Mental Status Exam:  Appearance:   Casual and Well Groomed     Behavior:  Appropriate  Motor:  Normal  Speech/Language:   Clear and Coherent  Affect:  Appropriate  Mood:  sad  Thought process:  normal  Thought content:    WNL  Sensory/Perceptual disturbances:    WNL  Orientation:  oriented to person, place, time/date and situation  Attention:  Good  Concentration:  Good  Memory:  WNL  Fund of knowledge:   Good  Insight:    Good  Judgment:   Good  Impulse Control:  Good   Risk Assessment: Danger to Self:  No Self-injurious Behavior: No Danger to Others: No Duty to Warn:no Physical Aggression / Violence:No  Access to Firearms a concern: No  Gang Involvement:No   Subjective: The client states that he has had a rotten week.  He and his wife continue to be separated.  He states he gets into a funk and becomes sad when she is critical of him.  As the client describes her interaction it sounds as if she views him through the lens regarding finances.  If he is not willing to give up his finances to her then he is a "bad person".  He states they have been separated for almost 3 years and have separate bank accounts.  He has been unwilling to fund the surgery hospital she wants to develop because she will not allow him to be involved. The client invited his wife to the [place] to watch him drive his BMW M3.  He stated they enjoyed themselves and he did up staying the night.  He does realize that their relationship is based on doing activities together and sleeping together at times.  There is no living life together though.  This is disappointing to the client.  I asked him what he could do to begin to change that?  He is unsure but wants to continue to wait  this out.  Interventions: Assertiveness/Communication, Motivational Interviewing, Solution-Oriented/Positive Psychology and Insight-Oriented  Diagnosis:   ICD-10-CM   1. Adjustment disorder with depressed mood  F43.21     Plan: Mood independent behavior, positive self talk, self-care, assertiveness, boundaries.  Amantha Sklar, [facility]

## 2020-06-17 ENCOUNTER — Ambulatory Visit (INDEPENDENT_AMBULATORY_CARE_PROVIDER_SITE_OTHER): Payer: 59 | Admitting: Pharmacist Clinician (PhC)/ Clinical Pharmacy Specialist

## 2020-06-17 ENCOUNTER — Other Ambulatory Visit: Payer: Self-pay

## 2020-06-17 DIAGNOSIS — Z952 Presence of prosthetic heart valve: Secondary | ICD-10-CM | POA: Diagnosis not present

## 2020-06-17 DIAGNOSIS — Z8673 Personal history of transient ischemic attack (TIA), and cerebral infarction without residual deficits: Secondary | ICD-10-CM | POA: Diagnosis not present

## 2020-06-17 DIAGNOSIS — Z7901 Long term (current) use of anticoagulants: Secondary | ICD-10-CM

## 2020-06-17 LAB — POCT INR: INR: 2.9 (ref 2.0–3.0)

## 2020-07-20 ENCOUNTER — Encounter: Payer: Self-pay | Admitting: Psychiatry

## 2020-07-20 ENCOUNTER — Ambulatory Visit (INDEPENDENT_AMBULATORY_CARE_PROVIDER_SITE_OTHER): Payer: 59 | Admitting: Psychiatry

## 2020-07-20 DIAGNOSIS — F4321 Adjustment disorder with depressed mood: Secondary | ICD-10-CM | POA: Diagnosis not present

## 2020-07-20 NOTE — Progress Notes (Signed)
      [facility] Counselor/Therapist Progress Note  Patient ID: ESTANISLAO HARMON, MRN: [id number],    Date: 07/20/2020  Time Spent: 45 minutes   Treatment Type: Individual Therapy  Reported Symptoms: sad  Mental Status Exam:  Appearance:   Casual and Well Groomed     Behavior:  Appropriate  Motor:  Normal  Speech/Language:   Clear and Coherent  Affect:  Appropriate  Mood:  sad  Thought process:  normal  Thought content:    WNL  Sensory/Perceptual disturbances:    WNL  Orientation:  oriented to person, place, time/date and situation  Attention:  Good  Concentration:  Good  Memory:  WNL  Fund of knowledge:   Good  Insight:    Good  Judgment:   Good  Impulse Control:  Good   Risk Assessment: Danger to Self:  No Self-injurious Behavior: No Danger to Others: No Duty to Warn:no Physical Aggression / Violence:No  Access to Firearms a concern: No  Gang Involvement:No   Subjective: The client states that his relationship with his wife has not improved significantly.  He thinks there are 2 main issues.  The first one has to do with money and the second has to do with her lack of unforgiveness towards him concerning the youngest stepson.  The client states that they now have been separated for 3 years.  The client's wife believes that the monies that the client has accumulated through investments and via inheritance should go to fund the things that she finds necessary.  The client has very clear ideas about the wife's use of his money.  He does not necessarily agree with her.  The client also believes that his wife has not forgiven him for getting angry with the youngest stepson.  The client has invested a major amount of time and money in his wife's children.  He has paid for colleges and helped provide for other needs.  This seems to have no impact on his wife.  He states the only place they really get along as when they are sleeping together which is pretty regularly although they live  in different houses.  The client continues to try to work through this conundrum.  He loves his wife and does not want to be divorced. I discussed with the client that my last day at work would be in April 29.  I suggested that he follow up with Georgia Dom, LCSW.  The client agrees and has his contact information.  Services ended by mutual consent.  Interventions: Assertiveness/Communication, Motivational Interviewing, Solution-Oriented/Positive Psychology and Insight-Oriented  Diagnosis:   ICD-10-CM   1. Adjustment disorder with depressed mood  F43.21     Plan: Mood independent behavior, positive self talk, self-care, assertiveness, boundaries, self-care, exercise.  Nihal Doan, [facility]

## 2020-07-21 ENCOUNTER — Other Ambulatory Visit: Payer: Self-pay | Admitting: Cardiology

## 2020-07-21 NOTE — Telephone Encounter (Signed)
Nelson no longer here routing to pharmd pool

## 2020-07-26 ENCOUNTER — Ambulatory Visit: Payer: 59 | Admitting: Psychiatry

## 2020-08-03 ENCOUNTER — Ambulatory Visit (INDEPENDENT_AMBULATORY_CARE_PROVIDER_SITE_OTHER): Payer: 59

## 2020-08-03 ENCOUNTER — Other Ambulatory Visit: Payer: Self-pay

## 2020-08-03 DIAGNOSIS — Z952 Presence of prosthetic heart valve: Secondary | ICD-10-CM

## 2020-08-03 DIAGNOSIS — Z8673 Personal history of transient ischemic attack (TIA), and cerebral infarction without residual deficits: Secondary | ICD-10-CM

## 2020-08-03 DIAGNOSIS — Z7901 Long term (current) use of anticoagulants: Secondary | ICD-10-CM | POA: Diagnosis not present

## 2020-08-03 LAB — POCT INR: INR: 1.7 — AB (ref 2.0–3.0)

## 2020-08-03 NOTE — Patient Instructions (Signed)
Take 2.5 tablets tonight only and then Continue taking 1.5 tablets daily. Call coumadin clinic [phone] if you have missed a dose or had any recent changes in medications or upcoming procedures.  Recheck INR in 4 weeks.

## 2020-08-31 ENCOUNTER — Other Ambulatory Visit: Payer: Self-pay

## 2020-08-31 ENCOUNTER — Ambulatory Visit (INDEPENDENT_AMBULATORY_CARE_PROVIDER_SITE_OTHER): Payer: 59

## 2020-08-31 DIAGNOSIS — Z7901 Long term (current) use of anticoagulants: Secondary | ICD-10-CM

## 2020-08-31 DIAGNOSIS — Z952 Presence of prosthetic heart valve: Secondary | ICD-10-CM

## 2020-08-31 DIAGNOSIS — Z8673 Personal history of transient ischemic attack (TIA), and cerebral infarction without residual deficits: Secondary | ICD-10-CM | POA: Diagnosis not present

## 2020-08-31 LAB — POCT INR: INR: 3.7 — AB (ref 2.0–3.0)

## 2020-08-31 NOTE — Patient Instructions (Signed)
Continue taking 1.5 tablets daily. Call coumadin clinic [phone] if you have missed a dose or had any recent changes in medications or upcoming procedures.  Recheck INR in 6 weeks.

## 2020-10-10 ENCOUNTER — Ambulatory Visit (INDEPENDENT_AMBULATORY_CARE_PROVIDER_SITE_OTHER): Payer: 59

## 2020-10-10 ENCOUNTER — Other Ambulatory Visit: Payer: Self-pay

## 2020-10-10 DIAGNOSIS — Z7901 Long term (current) use of anticoagulants: Secondary | ICD-10-CM

## 2020-10-10 DIAGNOSIS — Z952 Presence of prosthetic heart valve: Secondary | ICD-10-CM | POA: Diagnosis not present

## 2020-10-10 DIAGNOSIS — Z8673 Personal history of transient ischemic attack (TIA), and cerebral infarction without residual deficits: Secondary | ICD-10-CM | POA: Diagnosis not present

## 2020-10-10 LAB — POCT INR: INR: 2.9 (ref 2.0–3.0)

## 2020-10-10 NOTE — Patient Instructions (Signed)
Continue taking 1.5 tablets daily. Call coumadin clinic [phone] if you have missed a dose or had any recent changes in medications or upcoming procedures.  Recheck INR in 6 weeks.

## 2020-10-21 ENCOUNTER — Other Ambulatory Visit: Payer: Self-pay

## 2020-10-21 ENCOUNTER — Ambulatory Visit (INDEPENDENT_AMBULATORY_CARE_PROVIDER_SITE_OTHER): Payer: 59 | Admitting: Family Medicine

## 2020-10-21 ENCOUNTER — Encounter: Payer: Self-pay | Admitting: Family Medicine

## 2020-10-21 VITALS — BP 106/68 | HR 65 | Temp 96.2°F | Wt 180.4 lb

## 2020-10-21 DIAGNOSIS — R319 Hematuria, unspecified: Secondary | ICD-10-CM

## 2020-10-21 DIAGNOSIS — Z952 Presence of prosthetic heart valve: Secondary | ICD-10-CM

## 2020-10-21 DIAGNOSIS — Z7901 Long term (current) use of anticoagulants: Secondary | ICD-10-CM | POA: Diagnosis not present

## 2020-10-21 LAB — POCT URINALYSIS DIP (PROADVANTAGE DEVICE)
Bilirubin, UA: NEGATIVE
Blood, UA: NEGATIVE
Glucose, UA: NEGATIVE mg/dL
Ketones, POC UA: NEGATIVE mg/dL
Leukocytes, UA: NEGATIVE
Nitrite, UA: NEGATIVE
Specific Gravity, Urine: 1.03
Urobilinogen, Ur: 0.2
pH, UA: 5.5 (ref 5.0–8.0)

## 2020-10-21 NOTE — Progress Notes (Signed)
   Subjective:    Patient ID: Robert [name], male    DOB: 1960-08-04, 60 y.o.   MRN: [id number]  HPI He is here for evaluation of seeing frank blood in his urine after a run last Monday.  He has run since and has not seen any blood.  He is also running 5 marathons and seen no blood related to running in any of them.  He states that he has seen tinges of blood intermittently once or twice per year always related to running.  He states that he did have a previous episode of frank blood several years ago but does not remember being referred for further evaluation.  He is on chronic anticoagulation with his recent INR of 3.  No complaints of easy bruisability or seeing blood anywhere else.  Does state that he had a grandfather that apparently had bladder cancer.   Review of Systems     Objective:   Physical Exam Genital exam shows normal circumcised male with normal testes and penis.  Rectal exam shows a normal prostate.  Urinalysis is negative.       Assessment & Plan:  Hematuria, unspecified type - Plan: POCT Urinalysis DIP (Proadvantage Device), Ambulatory referral to Urology  S/P AVR (aortic valve replacement)  Chronic anticoagulation I do not think this is necessarily related to his anticoagulation but think further evaluation is warranted.

## 2020-11-09 ENCOUNTER — Other Ambulatory Visit: Payer: Self-pay

## 2020-11-09 ENCOUNTER — Ambulatory Visit (INDEPENDENT_AMBULATORY_CARE_PROVIDER_SITE_OTHER): Payer: 59 | Admitting: Urology

## 2020-11-09 ENCOUNTER — Encounter: Payer: Self-pay | Admitting: Urology

## 2020-11-09 VITALS — BP 128/72 | HR 58 | Ht 71.0 in | Wt 174.0 lb

## 2020-11-09 DIAGNOSIS — R319 Hematuria, unspecified: Secondary | ICD-10-CM | POA: Diagnosis not present

## 2020-11-09 NOTE — Progress Notes (Signed)
11/09/20 1:03 PM   Robert [name] Jun 03, 1960 [id number]  CC: Gross hematuria  HPI: I saw Mr. Ayre today for evaluation of gross hematuria.  He is a 60 year old male with a history of an aortic valve replacement on long-term anticoagulation with Coumadin who reports 1 episode of significant gross hematuria after a long run.  He reports a similar episode about 20 years ago that never underwent work-up.  He had 1 more episode of some pinkish urine after that, but no other problems with other urinary symptoms or burning or flank pain.  Urinalysis today is completely benign.   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[facility])     Surgical History: Past Surgical History:  Procedure Laterality Date   ABDOMINAL AORTIC ANEURYSM REPAIR     AORTIC VALVE REPLACEMENT  2007   CARDIAC CATHETERIZATION  01/2005   COLON SURGERY  2010   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   COLONOSCOPY     I & D EXTREMITY Right 06/23/2016   Procedure: IRRIGATION AND DEBRIDEMENT FOOT;  Surgeon: Netta Cedars, MD;  Location: [facility];  Service: Orthopedics;  Laterality: Right;   POLYPECTOMY     TENDON REPAIR Right 06/23/2016   Procedure: TENDON REPAIR;  Surgeon: Netta Cedars, MD;  Location: [place] [facility];  Service: Orthopedics;  Laterality: Right;     Family History: Family History  Problem Relation Age of Onset   CVA Mother 7       hemorrhagic cva   Colon cancer Paternal Grandfather    Colon cancer Paternal Grandmother    Bladder Cancer Maternal Grandfather    Diabetes Neg Hx    Kidney disease Neg Hx    Esophageal cancer Neg Hx     Gallbladder disease Neg Hx    Heart disease Neg Hx    Pancreatic cancer Neg Hx    Colon polyps Neg Hx    Rectal cancer Neg Hx    Stomach cancer Neg Hx     Social History:  reports that he has never smoked. He has never used smokeless tobacco. He reports current alcohol use. He reports that he does not use drugs.  Physical Exam: BP 128/72 (BP Location: Left Arm, Patient Position: Sitting, Cuff Size: Normal)   Pulse (!) 58   Ht '5\' 11"'$  (1.803 m)   Wt 174 lb (78.9 kg)   BMI 24.27 kg/m    Constitutional:  Alert and oriented, No acute distress. Cardiovascular: No clubbing, cyanosis, or edema. Respiratory: Normal respiratory effort, no increased work of breathing. GI: Abdomen is soft, nontender, nondistended, no abdominal masses  Laboratory Data: Reviewed  Pertinent Imaging: None reviewed  Assessment & Plan:   60 year old male with 1 episode of significant gross hematuria after long run.  We discussed common possible etiologies of hematuria including BPH, malignancy, urolithiasis, medical renal disease, and idiopathic. Standard workup recommended by the [facility] includes imaging with CT urogram to assess the upper tracts, and cystoscopy. Cytology is performed on patient's with gross hematuria to look for malignant cells in the urine.  Schedule CT urogram and cystoscopy  Nickolas Madrid, MD 11/09/2020  [facility] [address] [place], [place] [address] [phone]

## 2020-11-09 NOTE — Patient Instructions (Signed)
Cystoscopy Cystoscopy is a procedure that is used to help diagnose and sometimes treat conditions that affect the lower urinary tract. The lower urinary tract includes the bladder and the urethra. The urethra is the tube that drains urine from the bladder. Cystoscopy is done using a thin, tube-shaped instrument with a light and camera at the end (cystoscope). The cystoscope may be hard or flexible, depending on the goal of theprocedure. The cystoscope is inserted through the urethra, into the bladder. Cystoscopy may be recommended if you have: Urinary tract infections that keep coming back. Blood in the urine (hematuria). An inability to control when you urinate (urinary incontinence) or an overactive bladder. Unusual cells found in a urine sample. A blockage in the urethra, such as a urinary stone. Painful urination. An abnormality in the bladder found during an intravenous pyelogram (IVP) or CT scan. Cystoscopy may also be done to remove a sample of tissue to be examined under a microscope (biopsy). Tell a health care provider about: Any allergies you have. All medicines you are taking, including vitamins, herbs, eye drops, creams, and over-the-counter medicines. Any problems you or family members have had with anesthetic medicines. Any blood disorders you have. Any surgeries you have had. Any medical conditions you have. Whether you are pregnant or may be pregnant. What are the risks? Generally, this is a safe procedure. However, problems may occur, including: Infection. Bleeding. Allergic reactions to medicines. Damage to other structures or organs. What happens before the procedure? Medicines Ask your health care provider about: Changing or stopping your regular medicines. This is especially important if you are taking diabetes medicines or blood thinners. Taking medicines such as aspirin and ibuprofen. These medicines can thin your blood. Do not take these medicines unless your  health care provider tells you to take them. Taking over-the-counter medicines, vitamins, herbs, and supplements. Tests You may have an exam or testing, such as: X-rays of the bladder, urethra, or kidneys. CT scan of the abdomen or pelvis. Urine tests to check for signs of infection. General instructions Follow instructions from your health care provider about eating or drinking restrictions. Ask your health care provider what steps will be taken to help prevent infection. These steps may include: Washing skin with a germ-killing soap. Taking antibiotic medicine. Plan to have a responsible adult take you home from the hospital or clinic. What happens during the procedure?  You will be given one or more of the following: A medicine to help you relax (sedative). A medicine to numb the area (local anesthetic). The area around the opening of your urethra will be cleaned. The cystoscope will be passed through your urethra into your bladder. Germ-free (sterile) fluid will flow through the cystoscope to fill your bladder. The fluid will stretch your bladder so that your health care provider can clearly examine your bladder walls. Your doctor will look at the urethra and bladder. Your doctor may take a biopsy or remove stones. The cystoscope will be removed, and your bladder will be emptied. The procedure may vary among health care providers and hospitals. What can I expect after the procedure? After the procedure, it is common to have: Some soreness or pain in your abdomen and urethra. Urinary symptoms. These include: Mild pain or burning when you urinate. Pain should stop within a few minutes after you urinate. This may last for up to 1 week. A small amount of blood in your urine for several days. Feeling like you need to urinate but producing only a  small amount of urine. Follow these instructions at home: Medicines Take over-the-counter and prescription medicines only as told by your  health care provider. If you were prescribed an antibiotic medicine, take it as told by your health care provider. Do not stop taking the antibiotic even if you start to feel better. General instructions Return to your normal activities as told by your health care provider. Ask your health care provider what activities are safe for you. If you were given a sedative during the procedure, it can affect you for several hours. Do not drive or operate machinery until your health care provider says that it is safe. Watch for any blood in your urine. If the amount of blood in your urine increases, call your health care provider. Follow instructions from your health care provider about eating or drinking restrictions. If a tissue sample was removed for testing (biopsy) during your procedure, it is up to you to get your test results. Ask your health care provider, or the department that is doing the test, when your results will be ready. Drink enough fluid to keep your urine pale yellow. Keep all follow-up visits. This is important. Contact a health care provider if: You have pain that gets worse or does not get better with medicine, especially pain when you urinate. You have trouble urinating. You have more blood in your urine. Get help right away if: You have blood clots in your urine. You have abdominal pain. You have a fever or chills. You are unable to urinate. Summary Cystoscopy is a procedure that is used to help diagnose and sometimes treat conditions that affect the lower urinary tract. Cystoscopy is done using a thin, tube-shaped instrument with a light and camera at the end. After the procedure, it is common to have some soreness or pain in your abdomen and urethra. Watch for any blood in your urine. If the amount of blood in your urine increases, call your health care provider. If you were prescribed an antibiotic medicine, take it as told by your health care provider. Do not stop taking  the antibiotic even if you start to feel better. This information is not intended to replace advice given to you by your health care provider. Make sure you discuss any questions you have with your healthcare provider. Document Revised: 10/23/2019 Document Reviewed: 10/23/2019 Elsevier Patient Education  [place].

## 2020-11-10 LAB — URINALYSIS, COMPLETE
Bilirubin, UA: NEGATIVE
Glucose, UA: NEGATIVE
Leukocytes,UA: NEGATIVE
Nitrite, UA: NEGATIVE
Protein,UA: NEGATIVE
RBC, UA: NEGATIVE
Specific Gravity, UA: 1.03 (ref 1.005–1.030)
Urobilinogen, Ur: 0.2 mg/dL (ref 0.2–1.0)
pH, UA: 5 (ref 5.0–7.5)

## 2020-11-10 LAB — MICROSCOPIC EXAMINATION
Epithelial Cells (non renal): NONE SEEN /hpf (ref 0–10)
RBC, Urine: NONE SEEN /hpf (ref 0–2)

## 2020-11-17 ENCOUNTER — Other Ambulatory Visit: Payer: Self-pay

## 2020-11-17 ENCOUNTER — Ambulatory Visit
Admission: RE | Admit: 2020-11-17 | Discharge: 2020-11-17 | Disposition: A | Discharge status: Home / Self Care | Payer: 59 | Source: Ambulatory Visit | Attending: Urology | Admitting: Urology

## 2020-11-17 DIAGNOSIS — R319 Hematuria, unspecified: Secondary | ICD-10-CM | POA: Diagnosis present

## 2020-11-17 LAB — POCT I-STAT CREATININE: Creatinine, Ser: 0.8 mg/dL (ref 0.61–1.24)

## 2020-11-17 MED ORDER — IOHEXOL 350 MG/ML SOLN
100.0000 mL | Freq: Once | INTRAVENOUS | Status: AC | PRN
  Administered 2020-11-17: 100 mL via INTRAVENOUS

## 2020-11-21 ENCOUNTER — Ambulatory Visit (INDEPENDENT_AMBULATORY_CARE_PROVIDER_SITE_OTHER): Payer: 59

## 2020-11-21 ENCOUNTER — Other Ambulatory Visit: Payer: Self-pay

## 2020-11-21 DIAGNOSIS — Z7901 Long term (current) use of anticoagulants: Secondary | ICD-10-CM | POA: Diagnosis not present

## 2020-11-21 DIAGNOSIS — Z952 Presence of prosthetic heart valve: Secondary | ICD-10-CM

## 2020-11-21 DIAGNOSIS — Z8673 Personal history of transient ischemic attack (TIA), and cerebral infarction without residual deficits: Secondary | ICD-10-CM | POA: Diagnosis not present

## 2020-11-21 LAB — POCT INR: INR: 3 (ref 2.0–3.0)

## 2020-11-21 NOTE — Patient Instructions (Signed)
Continue taking 1.5 tablets daily. Call coumadin clinic [phone] if you have missed a dose or had any recent changes in medications or upcoming procedures.  Recheck INR in 6 weeks.

## 2020-11-23 ENCOUNTER — Telehealth: Payer: Self-pay | Admitting: Urology

## 2020-11-23 NOTE — Telephone Encounter (Signed)
Patient called in at closing and wanted to know if it would be ok for him to fly out of the country on Sunday after tomorrows procedure (he spoke to a doctor friend of his and his friend told him that he may not be able to). If he would not be able to, he would like to reschedule his procedure and he would like for someone to call him in the morning to let him know so that he won't waste time driving to [place]. His cysto is scheduled for 2:15pm.

## 2020-11-24 ENCOUNTER — Other Ambulatory Visit: Payer: Self-pay

## 2020-11-24 ENCOUNTER — Encounter: Payer: Self-pay | Admitting: Urology

## 2020-11-24 ENCOUNTER — Ambulatory Visit (INDEPENDENT_AMBULATORY_CARE_PROVIDER_SITE_OTHER): Payer: 59 | Admitting: Urology

## 2020-11-24 VITALS — BP 121/70 | HR 52 | Ht 71.0 in | Wt 177.2 lb

## 2020-11-24 DIAGNOSIS — R319 Hematuria, unspecified: Secondary | ICD-10-CM | POA: Diagnosis not present

## 2020-11-24 DIAGNOSIS — R310 Gross hematuria: Secondary | ICD-10-CM

## 2020-11-24 MED ORDER — SULFAMETHOXAZOLE-TRIMETHOPRIM 800-160 MG PO TABS
1.0000 | ORAL_TABLET | Freq: Once | ORAL | Status: AC
  Administered 2020-11-24: 1 via ORAL

## 2020-11-24 NOTE — Progress Notes (Signed)
Cystoscopy Procedure Note:  Indication: Gross hematuria  After informed consent and discussion of the procedure and its risks, Robert [name] was positioned and prepped in the standard fashion. Cystoscopy was performed with a flexible cystoscope. The urethra, bladder neck and entire bladder was visualized in a standard fashion. The prostate was moderate in size. The ureteral orifices were visualized in their normal location and orientation.  Bladder mucosa grossly normal throughout, no abnormalities on retroflexion.  Cytology sent  Imaging: CT with no urologic abnormalities  Findings: Normal cystoscopy  Assessment and Plan: No abnormalities on cystoscopy or CT, suspect prostatic source for bleeding in setting of anticoagulation Follow-up cytology results Consider finasteride in the future if recurrent hematuria  Nickolas Madrid, MD 11/24/2020

## 2020-11-24 NOTE — Telephone Encounter (Signed)
I responded to his MyChart that there would be a 1% chance of bleeding or infection with cystoscopy, and he could certainly cancel if he wanted and reschedule with his upcoming trip overseas.  Can you call him and see what he decided to do  Nickolas Madrid, MD 11/24/2020

## 2020-11-29 LAB — CYTOLOGY - NON PAP

## 2020-11-30 ENCOUNTER — Telehealth: Payer: Self-pay

## 2020-11-30 NOTE — Telephone Encounter (Signed)
-----   Message from Billey Co, MD sent at 11/30/2020  3:45 PM EDT ----- Good news, no cancer cells on urine sample.  Recommend 1 year follow-up  Nickolas Madrid, MD 11/30/2020

## 2020-11-30 NOTE — Telephone Encounter (Signed)
See mychart.  

## 2021-01-02 ENCOUNTER — Other Ambulatory Visit: Payer: Self-pay

## 2021-01-02 ENCOUNTER — Ambulatory Visit (INDEPENDENT_AMBULATORY_CARE_PROVIDER_SITE_OTHER): Payer: 59

## 2021-01-02 DIAGNOSIS — Z8673 Personal history of transient ischemic attack (TIA), and cerebral infarction without residual deficits: Secondary | ICD-10-CM | POA: Diagnosis not present

## 2021-01-02 DIAGNOSIS — Z952 Presence of prosthetic heart valve: Secondary | ICD-10-CM

## 2021-01-02 DIAGNOSIS — Z7901 Long term (current) use of anticoagulants: Secondary | ICD-10-CM | POA: Diagnosis not present

## 2021-01-02 LAB — POCT INR: INR: 5.2 — AB (ref 2.0–3.0)

## 2021-01-02 NOTE — Patient Instructions (Signed)
Hold today and Tuesday and then Continue taking 1.5 tablets daily. Call coumadin clinic [phone] if you have missed a dose or had any recent changes in medications or upcoming procedures.  Recheck INR in 3 weeks.

## 2021-01-23 ENCOUNTER — Other Ambulatory Visit: Payer: Self-pay

## 2021-01-23 ENCOUNTER — Ambulatory Visit (INDEPENDENT_AMBULATORY_CARE_PROVIDER_SITE_OTHER): Payer: 59

## 2021-01-23 DIAGNOSIS — Z952 Presence of prosthetic heart valve: Secondary | ICD-10-CM

## 2021-01-23 DIAGNOSIS — Z7901 Long term (current) use of anticoagulants: Secondary | ICD-10-CM | POA: Diagnosis not present

## 2021-01-23 DIAGNOSIS — Z8673 Personal history of transient ischemic attack (TIA), and cerebral infarction without residual deficits: Secondary | ICD-10-CM | POA: Diagnosis not present

## 2021-01-23 LAB — POCT INR: INR: 2.6 (ref 2.0–3.0)

## 2021-01-23 NOTE — Patient Instructions (Signed)
Continue taking 1.5 tablets daily. Call coumadin clinic [phone] if you have missed a dose or had any recent changes in medications or upcoming procedures.  Recheck INR in 6 weeks.

## 2021-02-06 ENCOUNTER — Other Ambulatory Visit: Payer: Self-pay | Admitting: *Deleted

## 2021-02-06 MED ORDER — WARFARIN SODIUM 5 MG PO TABS: ORAL_TABLET | ORAL | 1 refills | Status: DC

## 2021-02-23 ENCOUNTER — Other Ambulatory Visit: Payer: Self-pay

## 2021-02-23 ENCOUNTER — Encounter: Payer: Self-pay | Admitting: Podiatry

## 2021-02-23 ENCOUNTER — Ambulatory Visit (INDEPENDENT_AMBULATORY_CARE_PROVIDER_SITE_OTHER): Payer: 59 | Admitting: Podiatry

## 2021-02-23 DIAGNOSIS — B351 Tinea unguium: Secondary | ICD-10-CM | POA: Diagnosis not present

## 2021-02-23 NOTE — Progress Notes (Signed)
  Subjective:  Patient ID: Robert [name], male    DOB: 05-07-1960,   MRN: [id number]  Chief Complaint  Patient presents with   Nail Problem    Pt is concern with fungus in nail- menitoned he had a trip on Sunday night after tripping over a rock- pt mentioned after that he noticed a discoloration line in nail left hallux- concern with fungus     60  y.o. male presents for concern of left great toenail fungus. Relates on Sunday he tripped over a rock and jammed his toe. Toe has been doing better but noticed at that time discoloration of the nail. Relates he is not sure how long it has been there but was concerned and wanted it evaluated. Has not tried any treatments . Denies any other pedal complaints. Denies n/v/f/c.   Past Medical History:  Diagnosis Date   Aneurysm of ascending aorta    Anxiety    Blood transfusion without reported diagnosis    Cataract    BEGINNING   CVA (cerebral infarction)    DVT (deep venous thrombosis) (HCC)    Hx of adenomatous colonic polyps 05/11/2014   Hyperlipidemia    Migraine headache    Renal stone    S/P aortic valve replacement    Stroke (HCC)    Subacute bacterial endocarditis (SBE)     Objective:  Physical Exam: Vascular: DP/PT pulses 2/4 bilateral. CFT <3 seconds. Normal hair growth on digits. No edema.  Skin. No lacerations or abrasions bilateral feet. Left hallux nail with yellow white streak of discoloration noted extending >3/4 of the way from tip to base. Mild thickened noted.  Musculoskeletal: MMT 5/5 bilateral lower extremities in DF, PF, Inversion and Eversion. Deceased ROM in DF of ankle joint.  Neurological: Sensation intact to light touch.      Assessment:   1. Onychomycosis      Plan:  Patient was evaluated and treated and all questions answered. -Examined patient -Discussed treatment options for painful dystrophic/fungal nails  -Discussed fungal nail treatment options including oral, topical, and laser treatments.   -Patient would like to try laser treatment as he does not like pills. Will call to get him scheduled on nurses schedule for nail treatments.  -Patient to return in 3 months to evaluate progress.    Lorenda Peck, DPM

## 2021-02-28 NOTE — Progress Notes (Signed)
Cardiology Office Note:    Date:  03/01/2021   ID:  Robert [name], DOB Nov 30, 1960, MRN [id number]  PCP:  [name], MD  Cardiologist:  None    Referring MD: [name], MD   No chief complaint on file.   History of Present Illness:    Robert [name] is a 60 y.o. male with a hx of hyperlipidemia, DVT, CVA, anxiety, and ascending aorta aneurysm presenting today to establish care. He underwent aortic valve and root replacement in 2007. 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  He last saw Dr. [name] 08/2019 and was doing well. He had a repeat echo 09/2019 which revealed LVEF 60-65% and the valve was functioning well. The mean gradient was 42mmHg.   Today, he has been doing well. He used to lift weights strenuously. Symptoms of his aneurysm was initially noticed by his neighbor who is a cardiologist. Occasionally, he reports mild bilateral LE swelling. He notices marks after removing his socks but attributes these marks to wearing tight socks. He also reports constant mild pain along his L arm which is relieved by cold compress. He stays active by running. He used to run marathons and hopes to quality for the [employer]. [place] and performs yard work. However, he is nervous about lifting weights again because of his prosthetic aortic valve. He is worried about weakness as he becomes older and wonders if he is able to perform strength training for conditioning. For diet, he enjoys eating vegetables and eggs and avoids eating out. Of note, he is retired and would like to exercise more. He denies any palpitations, chest pain, or shortness of breath, lightheadedness, headaches, syncope, orthopnea, PND, lower extremity edema or exertional symptoms.  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    atorvastatin (LIPITOR) 10 MG tablet Take 1 tablet (10 mg total) by mouth daily.   enoxaparin (LOVENOX) 80 MG/0.8ML injection Inject 0.8 mLs (80 mg total) into the skin every 12 (twelve) hours.   gabapentin (NEURONTIN) 400 MG  capsule Take 400 mg by mouth as needed.   warfarin (COUMADIN) 5 MG tablet TAKE 1-1.5 TABLETS BY MOUTH DAILY AS DIRECTED BY COUMADIN CLINIC.   Current Facility-Administered Medications for the 03/01/21 encounter (Office Visit) with Skeet Latch, MD  Medication   0.9 %  sodium chloride infusion     Allergies:   Tape and Tapentadol   Social History   Socioeconomic History   Marital status: Married    Spouse name: Robert [name]   Number of children: 8   Years of education: BSEE   Highest education level: Not on file  Occupational History    Occupation: Scientist, forensic: [employer]  Tobacco Use   Smoking status: Never   Smokeless tobacco: Never  Vaping Use   Vaping Use: Never used  Substance and Sexual Activity   Alcohol use: Yes    Comment: rare   Drug use: No   Sexual activity: Yes    Partners: Female  Other Topics Concern   Not on file  Social History Narrative   Patient lives at home with his wife Robert [name].    Patient has 8 children. Boys   Patient is an Art gallery manager.         Social Determinants of Health   Financial Resource Strain: Low Risk    Difficulty of Paying Living Expenses: Not hard at all  Food Insecurity: No Food Insecurity   Worried About Charity fundraiser in the Last Year: Never true   [place] in the Last Year: Never true  Transportation Needs: No Transportation Needs   Lack of Transportation (Medical): No   Lack of Transportation (Non-Medical): No  Physical Activity: Sufficiently Active   Days of Exercise per Week: 7 days   Minutes of Exercise per Session: 70 min  Stress: Not on file  Social Connections: Not on file     Family History: The patient's family history includes Bladder Cancer in his maternal grandfather; CVA (age of onset: 68) in his mother; Colon cancer in his paternal grandfather and paternal grandmother. There is no history of Diabetes, Kidney disease, Esophageal cancer, Gallbladder disease, Heart disease, Pancreatic cancer, Colon polyps, Rectal cancer, or Stomach cancer.  ROS:   Please see the history of present illness.    (+) L arm pain All other systems reviewed and negative.   EKGs/Labs/Other Studies Reviewed:    The following studies were reviewed today: Echo 10/08/19 1. Left ventricular ejection fraction, by estimation, is 60 to 65%. The  left ventricle has normal function. The left ventricle has no regional  wall motion abnormalities. There is mild left ventricular hypertrophy.  Left ventricular diastolic parameters  were normal.   2. Right ventricular systolic function is normal. The right ventricular  size is normal. There is normal pulmonary artery systolic pressure.   3. The mitral valve is grossly normal. Trivial mitral valve regurgitation.   4. The aortic valve has been repaired/replaced. Aortic valve  regurgitation is trivial. There is a [employer] Jude bileaflet valve present in  the aortic position. Procedure Date: 2007. Echo findings are consistent  with normal structure and function of the  aortic valve prosthesis. Aortic valve area, by VTI measures 2.18 cm.  Aortic valve mean gradient measures 9.0 mmHg. Aortic valve Vmax measures 2.15 m/s.   5. The inferior vena cava is normal in size with greater than 50%  respiratory variability, suggesting right atrial pressure of 3 mmHg.  Comparison(s): Changes from  prior study are noted. 08/30/2016: LVEF 70-75%, mechanical AVR, mean gradient 8 mmHg.   Echo 09/09/16 - Left ventricle: The cavity size was normal. Wall thickness was    increased in a pattern of mild LVH. The estimated ejection    fraction was in the range of 70% to 75%. Wall motion was normal;    there were no regional wall motion abnormalities. Left    ventricular diastolic function parameters were normal.  - Aortic valve: There is a mechanical valve in the AV position,    unknown type and size. The valve is well seated with normal    function. Mean gradient (S): 8 mm Hg. Valve area (VTI): 3.99    cm^2. Valve area (Vmax): 2.93 cm^2. Valve area (Vmean): 2.81 cm^2.  - Mitral valve: Mildly calcified annulus. Mildly thickened leaflets. - Systemic veins: IVC is small, suggesting low RA pressure and    hypovolemia.   LE Doppler 09/07/16 No evidence of DVT within the right lower extremity.  EKG:   03/01/21: Sinus bradycardia, rate 55 bpm; first-degree AV-Block  Recent Labs: 11/17/2020: Creatinine, Ser 0.80   Recent Lipid Panel    Component Value Date/Time   CHOL 247 (H) 08/28/2019 0912   TRIG  86 08/28/2019 0912   HDL 67 08/28/2019 0912   CHOLHDL 3.7 08/28/2019 0912   CHOLHDL 2.8 02/23/2015 0001   VLDL 16 02/23/2015 0001   LDLCALC 165 (H) 08/28/2019 0912    CHA2DS2-VASc Score =   [ ] .  Therefore, the patient's annual risk of stroke is   %.        Physical Exam:    VS:  BP 118/80 (BP Location: Right Arm, Patient Position: Sitting, Cuff Size: Normal)   Pulse (!) 55   Ht 5\' 11"  (1.803 m)   Wt 177 lb 11.2 oz (80.6 kg)   BMI 24.78 kg/m  , BMI Body mass index is 24.78 kg/m. GENERAL:  Well appearing HEENT: Pupils equal round and reactive, fundi not visualized, oral mucosa unremarkable NECK:  No jugular venous distention, waveform within normal limits, carotid upstroke brisk and symmetric, no bruits, no thyromegaly LYMPHATICS:  No cervical adenopathy LUNGS:  Clear to auscultation bilaterally HEART:  RRR.  PMI not displaced or sustained, S1 within normal limits, mechanical S2, no S3, no S4, no clicks, no rubs, II/VI systolic murmur at the LUSB ABD:  Flat, positive bowel sounds normal in frequency in pitch, no bruits, no rebound, no guarding, no midline pulsatile mass, no hepatomegaly, no splenomegaly EXT:  2 plus pulses throughout, no edema, no cyanosis no clubbing SKIN:  No rashes no nodules NEURO:  Cranial nerves II through XII grossly intact, motor grossly intact throughout PSYCH:  Cognitively intact, oriented to person place and time  ASSESSMENT:    1. Hyperlipidemia LDL goal <100   2. H/O: CVA (cerebrovascular accident)   3. S/P AVR (aortic valve replacement)    PLAN:    Hyperlipidemia LDL goal <100 Lipids are elevated.  He is interested in staying off medication. We will repeat lipids today and get a coronary calcium score.  Given that he is running and planning to run a marathon, would avoid statins if possible.  H/O: CVA (cerebrovascular accident) Residual neuropathy.  He is otherwise well.   S/P AVR (aortic valve replacement) Valve and aortic root were  stable on his echo last year.  He exercises regularly and has no symptoms.  He is okay to continue running and okay to train for a marathon.  Recommend  that if he does strength training that he not do anything that causes straining or heavy weight.  In order of problems listed above:     Medication Adjustments/Labs and Tests Ordered: Current medicines are reviewed at length with the patient today.  Concerns regarding medicines are outlined above.  Orders Placed This Encounter  Procedures   CT CARDIAC SCORING (SELF PAY ONLY)   Lipid panel   Comprehensive metabolic panel   EKG 28-ASUO    No orders of the defined types were placed in this encounter.  Time spent: 40 minutes-Greater than 50% of this time was spent in counseling, explanation of diagnosis, planning of further management, and coordination of care.   Disposition: FU with Noreen Mackintosh C. Oval Linsey, MD, [facility] in 3-4 months  I,Mykaella Javier,acting as a scribe for Skeet Latch, MD.,have documented all relevant documentation on the behalf of Skeet Latch, MD,as directed by  Skeet Latch, MD while in the presence of Skeet Latch, MD.  I, [place] Oval Linsey, MD have reviewed all documentation for this visit.  The documentation of the exam, diagnosis, procedures, and orders on 03/01/2021 are all accurate and complete.   Signed, Skeet Latch, MD  03/01/2021 1:31 PM    [facility]

## 2021-03-01 ENCOUNTER — Encounter (HOSPITAL_BASED_OUTPATIENT_CLINIC_OR_DEPARTMENT_OTHER): Payer: Self-pay | Admitting: Cardiovascular Disease

## 2021-03-01 ENCOUNTER — Ambulatory Visit (INDEPENDENT_AMBULATORY_CARE_PROVIDER_SITE_OTHER): Payer: 59 | Admitting: Cardiovascular Disease

## 2021-03-01 ENCOUNTER — Other Ambulatory Visit: Payer: Self-pay

## 2021-03-01 DIAGNOSIS — Z8673 Personal history of transient ischemic attack (TIA), and cerebral infarction without residual deficits: Secondary | ICD-10-CM | POA: Diagnosis not present

## 2021-03-01 DIAGNOSIS — E785 Hyperlipidemia, unspecified: Secondary | ICD-10-CM | POA: Diagnosis not present

## 2021-03-01 DIAGNOSIS — Z952 Presence of prosthetic heart valve: Secondary | ICD-10-CM

## 2021-03-01 NOTE — Assessment & Plan Note (Signed)
Lipids are elevated.  He is interested in staying off medication. We will repeat lipids today and get a coronary calcium score.  Given that he is running and planning to run a marathon, would avoid statins if possible.

## 2021-03-01 NOTE — Patient Instructions (Addendum)
Medication Instructions:  Your physician recommends that you continue on your current medications as directed. Please refer to the Current Medication list given to you today.   *If you need a refill on your cardiac medications before your next appointment, please call your pharmacy*  Lab Work: LP/CMET TODAY   If you have labs (blood work) drawn today and your tests are completely normal, you will receive your results only by: [place] (if you have MyChart) OR A paper copy in the mail If you have any lab test that is abnormal or we need to change your treatment, we will call you to review the results.  Testing/Procedures: CALCIUM SCORE - THIS WILL COST $99 OUT OF POCKET   Follow-Up: At [facility] you and your health needs are our priority.  As part of our continuing mission to provide you with exceptional heart care, we have created designated Provider Care Teams.  These Care Teams include your primary Cardiologist (physician) and Advanced Practice Providers (APPs -  Physician Assistants and Nurse Practitioners) who all work together to provide you with the care you need, when you need it.  We recommend signing up for the patient portal called "MyChart".  Sign up information is provided on this After Visit Summary.  MyChart is used to connect with patients for Virtual Visits (Telemedicine).  Patients are able to view lab/test results, encounter notes, upcoming appointments, etc.  Non-urgent messages can be sent to your provider as well.   To learn more about what you can do with MyChart, go to [email or web address].    Your next appointment:   2-3 month(s)  The format for your next appointment:   In Person  Provider:   Skeet Latch, MD

## 2021-03-01 NOTE — Assessment & Plan Note (Signed)
Residual neuropathy.  He is otherwise well.

## 2021-03-01 NOTE — Assessment & Plan Note (Signed)
Valve and aortic root were stable on his echo last year.  He exercises regularly and has no symptoms.  He is okay to continue running and okay to train for a marathon.  Recommend that if he does strength training that he not do anything that causes straining or heavy weight.

## 2021-03-02 LAB — LIPID PANEL
Chol/HDL Ratio: 3.4 ratio (ref 0.0–5.0)
Cholesterol, Total: 216 mg/dL — ABNORMAL HIGH (ref 100–199)
HDL: 64 mg/dL (ref >39)
LDL Chol Calc (NIH): 139 mg/dL — ABNORMAL HIGH (ref 0–99)
Triglycerides: 72 mg/dL (ref 0–149)
VLDL Cholesterol Cal: 13 mg/dL (ref 5–40)

## 2021-03-02 LAB — COMPREHENSIVE METABOLIC PANEL
ALT: 23 IU/L (ref 0–44)
AST: 37 IU/L (ref 0–40)
Albumin/Globulin Ratio: 2.1 (ref 1.2–2.2)
Albumin: 4.5 g/dL (ref 3.8–4.9)
Alkaline Phosphatase: 82 IU/L (ref 44–121)
BUN/Creatinine Ratio: 30 — ABNORMAL HIGH (ref 9–20)
BUN: 26 mg/dL — ABNORMAL HIGH (ref 6–24)
Bilirubin Total: 0.8 mg/dL (ref 0.0–1.2)
CO2: 25 mmol/L (ref 20–29)
Calcium: 9.1 mg/dL (ref 8.7–10.2)
Chloride: 106 mmol/L (ref 96–106)
Creatinine, Ser: 0.88 mg/dL (ref 0.76–1.27)
Globulin, Total: 2.1 g/dL (ref 1.5–4.5)
Glucose: 94 mg/dL (ref 70–99)
Potassium: 4.9 mmol/L (ref 3.5–5.2)
Sodium: 142 mmol/L (ref 134–144)
Total Protein: 6.6 g/dL (ref 6.0–8.5)
eGFR: 99 mL/min/{1.73_m2} (ref >59)

## 2021-03-06 ENCOUNTER — Ambulatory Visit (INDEPENDENT_AMBULATORY_CARE_PROVIDER_SITE_OTHER): Payer: 59

## 2021-03-06 ENCOUNTER — Other Ambulatory Visit: Payer: Self-pay

## 2021-03-06 ENCOUNTER — Ambulatory Visit (INDEPENDENT_AMBULATORY_CARE_PROVIDER_SITE_OTHER): Payer: Self-pay

## 2021-03-06 DIAGNOSIS — B351 Tinea unguium: Secondary | ICD-10-CM

## 2021-03-06 DIAGNOSIS — Z8673 Personal history of transient ischemic attack (TIA), and cerebral infarction without residual deficits: Secondary | ICD-10-CM

## 2021-03-06 DIAGNOSIS — Z7901 Long term (current) use of anticoagulants: Secondary | ICD-10-CM | POA: Diagnosis not present

## 2021-03-06 DIAGNOSIS — Z952 Presence of prosthetic heart valve: Secondary | ICD-10-CM

## 2021-03-06 DIAGNOSIS — Z5181 Encounter for therapeutic drug level monitoring: Secondary | ICD-10-CM | POA: Diagnosis not present

## 2021-03-06 LAB — POCT INR: INR: 4 — AB (ref 2.0–3.0)

## 2021-03-06 NOTE — Progress Notes (Signed)
Patient presents today for the 1st laser treatment. Diagnosed with mycotic nail infection by Dr. Blenda Mounts.   Toenail most affected left hallux.  All other systems are negative.  Nails were filed thin. Laser therapy was administered to left 1st toenail and patient tolerated the treatment well. All safety precautions were in place.    Follow up in 4 weeks for laser # 2.

## 2021-03-06 NOTE — Patient Instructions (Signed)

## 2021-03-06 NOTE — Patient Instructions (Addendum)
-   take 1/2 tablet warfarin tonight, then - Continue taking 1.5 tablets warfarin daily.  Call coumadin clinic [phone] if you have missed a dose or had any recent changes in medications or upcoming procedures.   - Recheck INR in 6 weeks.

## 2021-04-07 ENCOUNTER — Ambulatory Visit (INDEPENDENT_AMBULATORY_CARE_PROVIDER_SITE_OTHER): Payer: Self-pay

## 2021-04-07 DIAGNOSIS — B351 Tinea unguium: Secondary | ICD-10-CM

## 2021-04-11 ENCOUNTER — Ambulatory Visit (INDEPENDENT_AMBULATORY_CARE_PROVIDER_SITE_OTHER)
Admission: RE | Admit: 2021-04-11 | Discharge: 2021-04-11 | Disposition: A | Discharge status: Home / Self Care | Payer: Self-pay | Source: Ambulatory Visit | Attending: Cardiovascular Disease | Admitting: Cardiovascular Disease

## 2021-04-11 ENCOUNTER — Other Ambulatory Visit: Payer: Self-pay

## 2021-04-11 DIAGNOSIS — E785 Hyperlipidemia, unspecified: Secondary | ICD-10-CM

## 2021-04-17 ENCOUNTER — Ambulatory Visit (INDEPENDENT_AMBULATORY_CARE_PROVIDER_SITE_OTHER): Payer: 59

## 2021-04-17 ENCOUNTER — Other Ambulatory Visit: Payer: Self-pay

## 2021-04-17 DIAGNOSIS — Z952 Presence of prosthetic heart valve: Secondary | ICD-10-CM

## 2021-04-17 DIAGNOSIS — Z8673 Personal history of transient ischemic attack (TIA), and cerebral infarction without residual deficits: Secondary | ICD-10-CM | POA: Diagnosis not present

## 2021-04-17 DIAGNOSIS — Z7901 Long term (current) use of anticoagulants: Secondary | ICD-10-CM | POA: Diagnosis not present

## 2021-04-17 LAB — POCT INR: INR: 3 (ref 2.0–3.0)

## 2021-04-17 NOTE — Patient Instructions (Signed)
-   Continue taking 1.5 tablets warfarin daily.  Call coumadin clinic [phone] if you have missed a dose or had any recent changes in medications or upcoming procedures.   - Recheck INR in 6 weeks.

## 2021-04-21 ENCOUNTER — Other Ambulatory Visit: Payer: Self-pay | Admitting: *Deleted

## 2021-04-21 DIAGNOSIS — Z5181 Encounter for therapeutic drug level monitoring: Secondary | ICD-10-CM

## 2021-04-21 DIAGNOSIS — E785 Hyperlipidemia, unspecified: Secondary | ICD-10-CM

## 2021-05-24 ENCOUNTER — Telehealth: Payer: Self-pay | Admitting: *Deleted

## 2021-05-24 NOTE — Telephone Encounter (Signed)
Patient notified to come to his upcoming appointment on 05/25/21.

## 2021-05-24 NOTE — Telephone Encounter (Signed)
Patient is calling because he only had one laser treatment, toes look more attractive,clearer now.Should he schedule another laser treatment or just come in for his f/u appointment on 05/25/21 for the partial therapy? He missed one appointment on 04/07/21.Please advise. ?

## 2021-05-25 ENCOUNTER — Encounter: Payer: Self-pay | Admitting: Podiatry

## 2021-05-25 ENCOUNTER — Other Ambulatory Visit: Payer: Self-pay

## 2021-05-25 ENCOUNTER — Ambulatory Visit (INDEPENDENT_AMBULATORY_CARE_PROVIDER_SITE_OTHER): Payer: 59 | Admitting: Podiatry

## 2021-05-25 DIAGNOSIS — M79675 Pain in left toe(s): Secondary | ICD-10-CM | POA: Diagnosis not present

## 2021-05-25 DIAGNOSIS — B351 Tinea unguium: Secondary | ICD-10-CM | POA: Diagnosis not present

## 2021-05-25 DIAGNOSIS — Z7901 Long term (current) use of anticoagulants: Secondary | ICD-10-CM

## 2021-05-25 DIAGNOSIS — M79674 Pain in right toe(s): Secondary | ICD-10-CM

## 2021-05-25 NOTE — Progress Notes (Signed)
?  Subjective:  ?Patient ID: Robert [name], male    DOB: [birth year],   MRN: [id number] ? ?No chief complaint on file. ? ? ?61 y.o. male presents for follow-up of left great toenail fungus. Relate he did one laser nail treatment and the nail already looks better. Relates he is doing well and has been running 10 miles a day.  Denies any other pedal complaints. Denies n/v/f/c.  ? ?Past Medical History:  ?Diagnosis Date  ? Aneurysm of ascending aorta   ? Anxiety   ? Blood transfusion without reported diagnosis   ? Cataract   ? BEGINNING  ? CVA (cerebral infarction)   ? DVT (deep venous thrombosis) ([place])   ? Hx of adenomatous colonic polyps 05/11/2014  ? Hyperlipidemia   ? Migraine headache   ? Renal stone   ? S/P aortic valve replacement   ? Stroke [facility])   ? Subacute bacterial endocarditis (SBE)   ? ? ?Objective:  ?Physical Exam: ?Vascular: DP/PT pulses 2/4 bilateral. CFT <3 seconds. Normal hair growth on digits. No edema.  ?Skin. No lacerations or abrasions bilateral feet. Left hallux nail with  white streak of discoloration noted extending >1/2 of the way from tip to base appears much improved. . Mild thickened noted.  ?Musculoskeletal: MMT 5/5 bilateral lower extremities in DF, PF, Inversion and Eversion. Deceased ROM in DF of ankle joint.  ?Neurological: Sensation intact to light touch.  ? ? ? ? ?Assessment:  ? ?1. Onychomycosis   ?2. Pain due to onychomycosis of toenails of both feet   ?3. Chronic anticoagulation   ? ? ? ? ?Plan:  ?Patient was evaluated and treated and all questions answered. ?-Examined patient ?-Discussed treatment options for painful dystrophic/fungal nails  ?-Discussed nail is improved and that we can keep an eye on it at this point. If he starts to notice any worsening we can then try doing another round of laser treatment.  ?-Patient to return as needed.  ? ? ?Lorenda Peck, DPM  ? ? ?

## 2021-05-29 ENCOUNTER — Telehealth: Payer: Self-pay

## 2021-05-29 NOTE — Telephone Encounter (Signed)
Tried calling pt to reschedule, mailbox full. ?

## 2021-06-22 ENCOUNTER — Ambulatory Visit (INDEPENDENT_AMBULATORY_CARE_PROVIDER_SITE_OTHER): Payer: 59

## 2021-06-22 DIAGNOSIS — Z952 Presence of prosthetic heart valve: Secondary | ICD-10-CM

## 2021-06-22 DIAGNOSIS — Z7901 Long term (current) use of anticoagulants: Secondary | ICD-10-CM | POA: Diagnosis not present

## 2021-06-22 DIAGNOSIS — Z8673 Personal history of transient ischemic attack (TIA), and cerebral infarction without residual deficits: Secondary | ICD-10-CM | POA: Diagnosis not present

## 2021-06-22 LAB — POCT INR: INR: 3.6 — AB (ref 2.0–3.0)

## 2021-06-22 NOTE — Patient Instructions (Signed)
-   Continue taking 1.5 tablets warfarin daily.  ?Call coumadin clinic [phone] if you have missed a dose or had any recent changes in medications or upcoming procedures.   ?- Recheck INR in 6 weeks.   ?

## 2021-06-26 NOTE — Progress Notes (Signed)
?Cardiology Office Note:   ? ?Date:  06/27/2021  ? ?ID:  Robert [name], DOB 06-29-1960, MRN [id number] ? ?PCP:  [name], MD  ?Cardiologist:  None   ? ?Referring MD: [name], MD  ? ?No chief complaint on file. ? ? ?History of Present Illness:   ? ?Robert [name] is a 61 y.o. male with a hx of hyperlipidemia, DVT, CVA, anxiety, and ascending aorta aneurysm presenting today for follow-up. He was initially seen 02/2021. He underwent aortic valve and root replacement in 2007. 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 He last saw Dr. [name] 08/2019 and was doing well. He had a repeat echo 09/2019 which revealed LVEF 60-65% and the valve was functioning well. The mean gradient was 78mHg.  ? ?At his last appointment, he was doing well and training for a marathon. His lipids were elevated but he preferred to stay off statins because of his activity. His calcium score 03/2021 was 4 which was 36th percentile for age-, race-, and sex-matched controls.  ? ?Today, he is doing well. However, occasionally, when he is resting, he feels his heart pounding he describes as similar to being anxious. He endorses he had pericarditis after his open heart surgery. He continues to train for the [employer] does a track work-out once a week. He is working on his diet for cholesterol management. He denies any chest pain, or shortness of breath, lightheadedness, headaches, syncope, orthopnea, PND, lower extremity edema or exertional symptoms. ? ?Past Medical History:  ?Diagnosis Date  ? Aneurysm of ascending aorta (HCC)   ? Anxiety   ? Blood transfusion without reported diagnosis   ? Bradycardia 06/27/2021  ? Cataract   ? BEGINNING  ? CVA (cerebral infarction)   ? DVT (deep venous thrombosis) ([place]   ? Hx of adenomatous colonic  polyps 05/11/2014  ? Hyperlipidemia   ? Migraine headache   ? Renal stone   ? S/P aortic valve replacement   ? Stroke ([facility]   ? Subacute bacterial endocarditis (SBE)   ? ? ?Past Surgical History:  ?Procedure Laterality Date  ? ABDOMINAL AORTIC ANEURYSM REPAIR    ? AORTIC VALVE REPLACEMENT  2007  ? CARDIAC CATHETERIZATION  01/2005  ? COLON SURGERY  2010  ? 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 COLONOSCOPY    ? I & D EXTREMITY Right 06/23/2016  ? Procedure: IRRIGATION AND DEBRIDEMENT FOOT;  Surgeon: SNetta Cedars MD;  [facility]: [facility]  Service: Orthopedics;  Laterality: Right;  ? POLYPECTOMY    ? TENDON REPAIR Right 06/23/2016  ? Procedure: TENDON REPAIR;  Surgeon: SNetta Cedars MD;  Location: [facility]  Service: Orthopedics;  Laterality: Right;  ? ? ?Current Medications: ?Current Meds  ?Medication Sig  ? acetaminophen (TYLENOL) 500 MG tablet Take 1,000 mg by mouth 4 (four) times daily as needed for pain.   ? enoxaparin (LOVENOX) 80 MG/0.8ML injection Inject 0.8 mLs (80 mg total) into the skin every 12 (twelve) hours.  ? gabapentin (NEURONTIN) 400 MG capsule Take 400 mg by mouth as needed.  ? warfarin (COUMADIN) 5 MG tablet TAKE 1-1.5 TABLETS BY MOUTH DAILY AS DIRECTED BY COUMADIN CLINIC.  ? ?Current Facility-Administered Medications for the 06/27/21 encounter (Office Visit) with RSkeet Latch MD  ?Medication  ? 0.9 %  sodium chloride infusion  ?  ? ?  Allergies:   Tape and Tapentadol  ? ?Social History  ? ?Socioeconomic History  ? Marital status: Legally Separated  ?  Spouse name: [name]  ? Number of children: 8  ? Years of education: BSEE  ? Highest education level: Not on file  ?Occupational History  ? Occupation: Art gallery manager  ?  Employer: [employer]  ?Tobacco Use  ? Smoking status: Never  ? Smokeless tobacco: Never  ?Vaping Use  ? Vaping Use: Never used  ?Substance and Sexual Activity  ? Alcohol use: Yes  ?  Comment: rare  ? Drug use: No  ? Sexual activity: Yes  ?  Partners:  Female  ?Other Topics Concern  ? Not on file  ?Social History Narrative  ? Patient lives at home with his wife [name].   ? Patient has 8 children. Boys  ? Patient is an Art gallery manager.  ?   ?   ? ?Social Determinants of Health  ? ?Financial Resource Strain: Low Risk   ? Difficulty of Paying Living Expenses: Not hard at all  ?Food Insecurity: No Food Insecurity  ? Worried About Charity fundraiser in the Last Year: Never true  ? Ran Out of Food in the Last Year: Never true  ?Transportation Needs: No Transportation Needs  ? Lack of Transportation (Medical): No  ? Lack of Transportation (Non-Medical): No  ?Physical Activity: Sufficiently Active  ? Days of Exercise per Week: 7 days  ? Minutes of Exercise per Session: 70 min  ?Stress: Not on file  ?Social Connections: Not on file  ?  ? ?Family History: ?The patient's family history includes Bladder Cancer in his maternal grandfather; CVA (age of onset: 31) in his mother; Colon cancer in his paternal grandfather and paternal grandmother. There is no history of Diabetes, Kidney disease, Esophageal cancer, Gallbladder disease, Heart disease, Pancreatic cancer, Colon polyps, Rectal cancer, or Stomach cancer. ? ?ROS:   ?Please see the history of present illness.    ?(+) Palpitations  ?All other systems reviewed and negative.  ? ?EKGs/Labs/Other Studies Reviewed:   ? ?The following studies were reviewed today: ?CT Cardiac Score 04/11/21 ?IMPRESSION: ?Coronary calcium score of 4. This was 36th percentile for age-, race-, and sex-matched controls. Presence of aortic tube graft and bileaflet mechanical aortic valve noted. ? ?Echo 10/08/19 ?1. Left ventricular ejection fraction, by estimation, is 60 to 65%. The  ?left ventricle has normal function. The left ventricle has no regional  ?wall motion abnormalities. There is mild left ventricular hypertrophy.  ?Left ventricular diastolic parameters were normal.  ? 2. Right ventricular systolic function is normal. The right  ventricular  ?size is normal. There is normal pulmonary artery systolic pressure.  ? 3. The mitral valve is grossly normal. Trivial mitral valve regurgitation.  ? 4. The aortic valve has been repaired/replaced. Aortic valve  ?regurgitation is trivial. There is a St. Jude bileaflet valve present in  ?the aortic position. Procedure Date: 2007. Echo findings are consistent  ?with normal structure and function of the  ?aortic valve prosthesis. Aortic valve area, by VTI measures 2.18 cm?Marland Kitchen  ?Aortic valve mean gradient measures 9.0 mmHg. Aortic valve Vmax measures 2.15 m/s.  ? 5. The inferior vena cava is normal in size with greater than 50%  ?respiratory variability, suggesting right atrial pressure of 3 mmHg.  ?Comparison(s): Changes from prior study are noted. 08/30/2016: LVEF 70-75%, mechanical AVR, mean gradient 8 mmHg.  ? ?Echo 09/09/16 ?- Left ventricle: The cavity size  was normal. Wall thickness was  ?  increased in a pattern of mild LVH. The estimated ejection  ?  fraction was in the range of 70% to 75%. Wall motion was normal;  ?  there were no regional wall motion abnormalities. Left  ?  ventricular diastolic function parameters were normal.  ?- Aortic valve: There is a mechanical valve in the AV position,  ?  unknown type and size. The valve is well seated with normal  ?  function. Mean gradient (S): 8 mm Hg. Valve area (VTI): 3.99  ?  cm^2. Valve area (Vmax): 2.93 cm^2. Valve area (Vmean): 2.81 cm^2.  ?- Mitral valve: Mildly calcified annulus. Mildly thickened leaflets. ?- Systemic veins: IVC is small, suggesting low RA pressure and  ?  hypovolemia.  ? ?LE Doppler 09/07/16 ?No evidence of DVT within the right lower extremity. ? ?EKG:  ?06/27/21: Sinus bradycardia, rate 46 bpm; first-degree AV-Block ?03/01/21: Sinus bradycardia, rate 55 bpm; first-degree AV-Block ? ?Recent Labs: ?03/01/2021: ALT 23; BUN 26; Creatinine, Ser 0.88; Potassium 4.9; Sodium 142  ? ?Recent Lipid Panel ?   ?Component Value Date/Time  ? CHOL 216  (H) 03/01/2021 0919  ? TRIG 72 03/01/2021 0919  ? HDL 64 03/01/2021 0919  ? CHOLHDL 3.4 03/01/2021 0919  ? CHOLHDL 2.8 02/23/2015 0001  ? VLDL 16 02/23/2015 0001  ? [place] 139 (H) 03/01/2021 0919  ? ? ?CHA2DS2

## 2021-06-27 ENCOUNTER — Ambulatory Visit (INDEPENDENT_AMBULATORY_CARE_PROVIDER_SITE_OTHER): Payer: 59 | Admitting: Cardiovascular Disease

## 2021-06-27 ENCOUNTER — Encounter (HOSPITAL_BASED_OUTPATIENT_CLINIC_OR_DEPARTMENT_OTHER): Payer: Self-pay | Admitting: Cardiovascular Disease

## 2021-06-27 DIAGNOSIS — E785 Hyperlipidemia, unspecified: Secondary | ICD-10-CM

## 2021-06-27 DIAGNOSIS — R001 Bradycardia, unspecified: Secondary | ICD-10-CM | POA: Diagnosis not present

## 2021-06-27 DIAGNOSIS — Z7901 Long term (current) use of anticoagulants: Secondary | ICD-10-CM

## 2021-06-27 DIAGNOSIS — Z952 Presence of prosthetic heart valve: Secondary | ICD-10-CM

## 2021-06-27 HISTORY — DX: Bradycardia, unspecified: R00.1

## 2021-06-27 NOTE — Patient Instructions (Signed)
Medication Instructions:  Your physician recommends that you continue on your current medications as directed. Please refer to the Current Medication list given to you today.   *If you need a refill on your cardiac medications before your next appointment, please call your pharmacy*  Lab Work: NONE  Testing/Procedures: Your physician has requested that you have an echocardiogram. Echocardiography is a painless test that uses sound waves to create images of your heart. It provides your doctor with information about the size and shape of your heart and how well your heart's chambers and valves are working. This procedure takes approximately one hour. There are no restrictions for this procedure.   Follow-Up: At [facility], you and your health needs are our priority.  As part of our continuing mission to provide you with exceptional heart care, we have created designated Provider Care Teams.  These Care Teams include your primary Cardiologist (physician) and Advanced Practice Providers (APPs -  Physician Assistants and Nurse Practitioners) who all work together to provide you with the care you need, when you need it.  We recommend signing up for the patient portal called "MyChart".  Sign up information is provided on this After Visit Summary.  MyChart is used to connect with patients for Virtual Visits (Telemedicine).  Patients are able to view lab/test results, encounter notes, upcoming appointments, etc.  Non-urgent messages can be sent to your provider as well.   To learn more about what you can do with MyChart, go to [email or web address].    Your next appointment:   12 month(s)  The format for your next appointment:   In Person  Provider:   Tiffany [place], MD         

## 2021-06-27 NOTE — Assessment & Plan Note (Addendum)
On warfarin for mechanical AVR and prior DVT. ?

## 2021-06-27 NOTE — Assessment & Plan Note (Signed)
Asymptomatic.  Due to distance running. ?

## 2021-06-27 NOTE — Assessment & Plan Note (Signed)
Lipids are not well-controlled despite vigorous exercise and diet.  He runs marathons and is concerned about myalgias with statins.  Will check with PharmD to see if he could qualify for alternative agent. ?

## 2021-06-27 NOTE — Assessment & Plan Note (Signed)
He feels that his heart is struggling to be when at rest.  His heartbeats are very strong.  He has no difficulty with his extended runs and has not had any changes in his exertional capacity.  Its been a couple years since he had an echo.  We will repeat.  Overall I suspect that everything is okay. ?

## 2021-07-17 ENCOUNTER — Ambulatory Visit (INDEPENDENT_AMBULATORY_CARE_PROVIDER_SITE_OTHER): Payer: 59

## 2021-07-17 DIAGNOSIS — Z952 Presence of prosthetic heart valve: Secondary | ICD-10-CM

## 2021-07-17 LAB — ECHOCARDIOGRAM COMPLETE
AR max vel: 1.71 cm2
AV Area VTI: 2.16 cm2
AV Area mean vel: 2.1 cm2
AV Mean grad: 10 mmHg
AV Peak grad: 18.9 mmHg
Ao pk vel: 2.18 m/s
Area-P 1/2: 3.76 cm2
Calc EF: 72.6 %
S' Lateral: 3.02 cm
Single Plane A2C EF: 65.2 %
Single Plane A4C EF: 74.9 %

## 2021-08-03 ENCOUNTER — Ambulatory Visit (INDEPENDENT_AMBULATORY_CARE_PROVIDER_SITE_OTHER): Payer: 59 | Admitting: Pharmacist Clinician (PhC)/ Clinical Pharmacy Specialist

## 2021-08-03 ENCOUNTER — Encounter: Payer: Self-pay | Admitting: Pharmacist Clinician (PhC)/ Clinical Pharmacy Specialist

## 2021-08-03 DIAGNOSIS — Z8673 Personal history of transient ischemic attack (TIA), and cerebral infarction without residual deficits: Secondary | ICD-10-CM

## 2021-08-03 DIAGNOSIS — Z7901 Long term (current) use of anticoagulants: Secondary | ICD-10-CM | POA: Diagnosis not present

## 2021-08-03 DIAGNOSIS — E785 Hyperlipidemia, unspecified: Secondary | ICD-10-CM | POA: Diagnosis not present

## 2021-08-03 DIAGNOSIS — Z952 Presence of prosthetic heart valve: Secondary | ICD-10-CM | POA: Diagnosis not present

## 2021-08-03 LAB — POCT INR: INR: 1.9 — AB (ref 2.0–3.0)

## 2021-08-03 MED ORDER — ROSUVASTATIN CALCIUM 5 MG PO TABS: ORAL_TABLET | ORAL | 3 refills | Status: DC

## 2021-08-03 NOTE — Assessment & Plan Note (Signed)
Patient with hyperlipidemia and history of stroke, would like to see LDL < 70.   He had problems with atorvastatin, as the muscle pains in his legs impeded his ability to run long distances.  He has not tried another statin.  We will start him on rosuvastatin 5 mg three times weekly to see if he can tolerate this dose.  If not will consider starting Repatha 140 mg every 14 days.   ?

## 2021-08-03 NOTE — Patient Instructions (Addendum)
Your Results: ?           ? Your most recent labs Goal  ?Total Cholesterol 216 < 200  ?Triglycerides 72 < 150  ?HDL (happy/good cholesterol) 64 > 40  ?LDL (lousy/bad cholesterol 139 < 70  ? ?Medication changes: ?  Start rosuvastatin 5 mg three times weekly.  If you have problems with this, then reach out to [place].   If it doesn't work we will reach out to your insurance to see if Repatha will be approved.   ? ? ?Lab orders: ? We want to repeat labs after 2-3 months.  We will send you a lab order to remind you once we get closer to that time.   ? ?Next Coumadin Check Thursday June 8 at 1 pm ? ? ?Thank you for choosing [facility]  ? ?

## 2021-08-03 NOTE — Progress Notes (Signed)
08/03/2021 ?Robert [name] ?1960/07/30 ?[id number] ? ? ?HPI:  Robert [name] is a 61 y.o. male patient of Dr Oval Linsey, who presents today for a lipid clinic evaluation.  See pertinent past medical history below.    He was seen by Dr. Oval Linsey last month for routine follow up.  He noted to her that he could not tolerate atorvastatin, as it was causing muscle discomfort when he would run long distances.  He is training for a marathon and often runs 10+ miles at a time.  Most recently he has been drinking a camomile tea with apple cider vinegar, lemon juice and cinnamon as part of a liver detoxify.  ? ? ?Past Medical History: ?CVA 2010 no residual defecits  ?DVT 2010, provoked  ?Aneurysm Ascending aortic  ?AVR Aortic valve replaced 2010  ?   ? ? ?Current Medications: none ? ?Cholesterol Goals: LDL < 70 ?  ?Intolerant/previously tried: atorvastatin - muscle problems ? ?Family history: father had CHF, died at 29, had high cholesterol;  ? ?Diet: watches diet closely, eats plenty of protein, will sometimes do carb load after long run; no sodas; gave up white bread, now using olive oil ? ?Exercise:  marathon training, runs 10+ miles, as well as shorter runs ? ?Labs: 12/22; TC 216, TG 72, HDL 64, LDL 139 ? ? ?Current Outpatient Medications  ?Medication Sig Dispense Refill  ? acetaminophen (TYLENOL) 500 MG tablet Take 1,000 mg by mouth 4 (four) times daily as needed for pain.     ? gabapentin (NEURONTIN) 400 MG capsule Take 400 mg by mouth as needed.    ? rosuvastatin (CRESTOR) 5 MG tablet Take 1 tablet by mouth three times weekly 12 tablet 3  ? warfarin (COUMADIN) 5 MG tablet TAKE 1-1.5 TABLETS BY MOUTH DAILY AS DIRECTED BY COUMADIN CLINIC. 135 tablet 1  ? enoxaparin (LOVENOX) 80 MG/0.8ML injection Inject 0.8 mLs (80 mg total) into the skin every 12 (twelve) hours. (Patient not taking: Reported on 08/03/2021) 8 mL 0  ? ?Current Facility-Administered Medications  ?Medication Dose Route Frequency Provider Last Rate Last Admin  ? 0.9 %   sodium chloride infusion  500 mL Intravenous Once Gatha Mayer, MD      ? ? ?Allergies  ?Allergen Reactions  ? Tape Rash and Other (See Comments)  ?  Other reaction(s): Other (See Comments) ?Makes skin raw ?Inflammation and TEARS OFF THE SKIN!! ?Other reaction(s): Other (See Comments) ?Makes skin raw ?Inflammation and TEARS OFF THE SKIN!!  ? Tapentadol Other (See Comments) and Rash  ?  Other reaction(s): Other (See Comments) ?Makes skin raw ?Inflammation and TEARS OFF THE SKIN!! ?Inflammation and TEARS OFF THE SKIN!!  ? Lipitor [Atorvastatin]   ?  Issues processing lactic acid  ? ? ?Past Medical History:  ?Diagnosis Date  ? Aneurysm of ascending aorta (H[facility])   ? Anxiety   ? Blood transfusion without reported diagnosis   ? Bradycardia 06/27/2021  ? Cataract   ? BEGINNING  ? CVA (cerebral infarction)   ? DVT (deep venous thrombosis) ([place])   ? Hx of adenomatous colonic polyps 05/11/2014  ? Hyperlipidemia   ? Migraine headache   ? Renal stone   ? S/P aortic valve replacement   ? Stroke [facility])   ? Subacute bacterial endocarditis (SBE)   ? ? ?Blood pressure 122/70, pulse (!) 56, resp. rate 16, height '5\' 11"'$  (1.803 m), weight 174 lb (78.9 kg), SpO2 97 %. ? ? ?Hyperlipidemia LDL goal <100 ?Patient with hyperlipidemia and history  of stroke, would like to see LDL < 70.   He had problems with atorvastatin, as the muscle pains in his legs impeded his ability to run long distances.  He has not tried another statin.  We will start him on rosuvastatin 5 mg three times weekly to see if he can tolerate this dose.  If not will consider starting Repatha 140 mg every 14 days.   ? ? ?Tommy Medal PharmD CPP [facility] ?[place] ?[address] ?[place],  [address] ?[phone] ? ? ? ?

## 2021-08-15 ENCOUNTER — Other Ambulatory Visit: Payer: Self-pay | Admitting: Cardiovascular Disease

## 2021-08-31 ENCOUNTER — Ambulatory Visit (INDEPENDENT_AMBULATORY_CARE_PROVIDER_SITE_OTHER): Payer: 59 | Admitting: *Deleted

## 2021-08-31 DIAGNOSIS — Z952 Presence of prosthetic heart valve: Secondary | ICD-10-CM | POA: Diagnosis not present

## 2021-08-31 DIAGNOSIS — Z7901 Long term (current) use of anticoagulants: Secondary | ICD-10-CM

## 2021-08-31 DIAGNOSIS — Z8673 Personal history of transient ischemic attack (TIA), and cerebral infarction without residual deficits: Secondary | ICD-10-CM

## 2021-08-31 LAB — POCT INR: INR: 3 (ref 2.0–3.0)

## 2021-08-31 NOTE — Patient Instructions (Signed)
Description   Continue taking 1.5 tablets warfarin daily. Call coumadin clinic [phone] if you have missed a dose or had any recent changes in medications or upcoming procedures. Recheck INR in 5 weeks.

## 2021-10-05 ENCOUNTER — Ambulatory Visit (INDEPENDENT_AMBULATORY_CARE_PROVIDER_SITE_OTHER): Payer: 59 | Admitting: *Deleted

## 2021-10-05 DIAGNOSIS — Z7901 Long term (current) use of anticoagulants: Secondary | ICD-10-CM

## 2021-10-05 DIAGNOSIS — Z952 Presence of prosthetic heart valve: Secondary | ICD-10-CM

## 2021-10-05 DIAGNOSIS — Z5181 Encounter for therapeutic drug level monitoring: Secondary | ICD-10-CM | POA: Diagnosis not present

## 2021-10-05 LAB — POCT INR: INR: 2.4 (ref 2.0–3.0)

## 2021-10-05 NOTE — Patient Instructions (Signed)
Description   Take 2 tablets of warfarin today, then continue taking 1.5 tablets warfarin daily. Call coumadin clinic [phone] if you have missed a dose or had any recent changes in medications or upcoming procedures. Recheck INR in  4 weeks.

## 2021-10-27 ENCOUNTER — Other Ambulatory Visit: Payer: Self-pay | Admitting: Cardiovascular Disease

## 2021-11-06 ENCOUNTER — Ambulatory Visit (INDEPENDENT_AMBULATORY_CARE_PROVIDER_SITE_OTHER): Payer: 59

## 2021-11-06 DIAGNOSIS — Z7901 Long term (current) use of anticoagulants: Secondary | ICD-10-CM | POA: Diagnosis not present

## 2021-11-06 DIAGNOSIS — Z8673 Personal history of transient ischemic attack (TIA), and cerebral infarction without residual deficits: Secondary | ICD-10-CM | POA: Diagnosis not present

## 2021-11-06 DIAGNOSIS — Z5181 Encounter for therapeutic drug level monitoring: Secondary | ICD-10-CM | POA: Diagnosis not present

## 2021-11-06 DIAGNOSIS — Z952 Presence of prosthetic heart valve: Secondary | ICD-10-CM

## 2021-11-06 LAB — POCT INR: INR: 2.7 (ref 2.0–3.0)

## 2021-11-06 NOTE — Patient Instructions (Signed)
continue taking 1.5 tablets warfarin daily. Call coumadin clinic [phone] if you have missed a dose or had any recent changes in medications or upcoming procedures. Recheck INR in 6 weeks.

## 2021-11-13 ENCOUNTER — Telehealth: Payer: Self-pay | Admitting: Urology

## 2021-11-13 DIAGNOSIS — Z9889 Other specified postprocedural states: Secondary | ICD-10-CM

## 2021-11-13 NOTE — Telephone Encounter (Signed)
Patient lvm and would like a referral to [facility] for a vasectomy reversal.

## 2021-11-14 NOTE — Telephone Encounter (Signed)
Referral placed.

## 2021-11-29 ENCOUNTER — Ambulatory Visit: Payer: 59 | Admitting: Urology

## 2021-11-29 ENCOUNTER — Encounter: Payer: Self-pay | Admitting: Internal Medicine

## 2021-12-07 ENCOUNTER — Telehealth (HOSPITAL_BASED_OUTPATIENT_CLINIC_OR_DEPARTMENT_OTHER): Payer: Self-pay | Admitting: Cardiovascular Disease

## 2021-12-07 ENCOUNTER — Ambulatory Visit: Payer: 59 | Admitting: Urology

## 2021-12-07 DIAGNOSIS — Z7901 Long term (current) use of anticoagulants: Secondary | ICD-10-CM

## 2021-12-07 NOTE — Telephone Encounter (Signed)
   Pre-operative Risk Assessment    Patient Name: Robert [name]  DOB: 03-03-1961 MRN: [id number]      Request for Surgical Clearance    Procedure:  Bilateral Microscopic Vasectomy  Reversal Vasovasostomy  Versu Vasoepididymostomye  Date of Surgery:  03-13-22 Clearance                                  Surgeon:  Dr Lutricia Feil Group or Practice Name:   Phone number:  [phone] Fax number:  [phone]   Type of Clearance Requested:   - Medical  - Pharmacy:  Hold Warfarin (Coumadin)     Type of Anesthesia:  General    Additional requests/questions:    Lorin Glass   12/07/2021, 12:06 PM

## 2021-12-13 NOTE — Telephone Encounter (Signed)
Patient with diagnosis of prior CVA, prior DVT, mechanical AVR on warfarin for anticoagulation.    Procedure: Bilateral Microscopic Vasectomy  Reversal Vasovasostomy  Versu Vasoepididymostomye Date of procedure: 03/13/2022  CrCl 85 Last platelet count was in 2021 - will need to repeat for final decision    **This guidance is not considered finalized until pre-operative APP has relayed final recommendations.**

## 2021-12-14 ENCOUNTER — Other Ambulatory Visit: Payer: Self-pay | Admitting: *Deleted

## 2021-12-14 DIAGNOSIS — Z7901 Long term (current) use of anticoagulants: Secondary | ICD-10-CM

## 2021-12-14 NOTE — Telephone Encounter (Signed)
Can you please help [place] place the CBC order correctly (see pharm note)? Preop team will retain chart until after this is resulted so we can elicit pharm input and plan VV thereafter.

## 2021-12-14 NOTE — Telephone Encounter (Signed)
Order for CBC has been placed for pt to have drawn as part of Surgical Clearance at the [facility] Office, when pt sees Coumadin Clinic.

## 2021-12-18 ENCOUNTER — Ambulatory Visit: Payer: 59

## 2021-12-28 DIAGNOSIS — H20013 Primary iridocyclitis, bilateral: Secondary | ICD-10-CM | POA: Diagnosis not present

## 2022-01-02 ENCOUNTER — Encounter: Payer: Self-pay | Admitting: Internal Medicine

## 2022-01-04 DIAGNOSIS — H20013 Primary iridocyclitis, bilateral: Secondary | ICD-10-CM | POA: Diagnosis not present

## 2022-01-08 ENCOUNTER — Ambulatory Visit: Payer: 59 | Attending: Cardiology

## 2022-01-08 DIAGNOSIS — Z952 Presence of prosthetic heart valve: Secondary | ICD-10-CM | POA: Diagnosis not present

## 2022-01-08 DIAGNOSIS — Z7901 Long term (current) use of anticoagulants: Secondary | ICD-10-CM

## 2022-01-08 LAB — POCT INR: INR: 4.1 — AB (ref 2.0–3.0)

## 2022-01-08 NOTE — Patient Instructions (Signed)
HOLD TODAY ONLY and then continue taking 1.5 tablets warfarin daily. Call coumadin clinic [phone] if you have missed a dose or had any recent changes in medications or upcoming procedures. Recheck INR in 2 weeks.

## 2022-01-12 NOTE — Telephone Encounter (Signed)
Next INR check 01/22/22, note in appt for Coumadin clinic to send pt to lab

## 2022-01-15 ENCOUNTER — Encounter: Payer: Self-pay | Admitting: Internal Medicine

## 2022-01-22 ENCOUNTER — Ambulatory Visit: Payer: 59 | Attending: Cardiology

## 2022-01-22 DIAGNOSIS — Z952 Presence of prosthetic heart valve: Secondary | ICD-10-CM | POA: Diagnosis not present

## 2022-01-22 DIAGNOSIS — Z7901 Long term (current) use of anticoagulants: Secondary | ICD-10-CM | POA: Diagnosis not present

## 2022-01-22 LAB — POCT INR: INR: 1.6 — AB (ref 2.0–3.0)

## 2022-01-22 NOTE — Patient Instructions (Signed)
Description   Take 2 tablets today and then continue taking 1.5 tablets warfarin daily.  Call coumadin clinic [phone] if you have missed a dose or had any recent changes in medications or upcoming procedures.  Recheck INR in 2 weeks.

## 2022-02-05 ENCOUNTER — Ambulatory Visit: Payer: 59 | Attending: Cardiology

## 2022-02-05 DIAGNOSIS — Z7901 Long term (current) use of anticoagulants: Secondary | ICD-10-CM | POA: Diagnosis not present

## 2022-02-05 DIAGNOSIS — Z952 Presence of prosthetic heart valve: Secondary | ICD-10-CM

## 2022-02-05 LAB — POCT INR: INR: 2.7 (ref 2.0–3.0)

## 2022-02-05 NOTE — Patient Instructions (Signed)
continue taking 1.5 tablets warfarin daily.  Call coumadin clinic [phone] if you have missed a dose or had any recent changes in medications or upcoming procedures. Procedure 12/19 preop in progress. Recheck INR in 4 weeks.

## 2022-02-06 DIAGNOSIS — Z7901 Long term (current) use of anticoagulants: Secondary | ICD-10-CM | POA: Diagnosis not present

## 2022-02-07 ENCOUNTER — Telehealth: Payer: Self-pay | Admitting: *Deleted

## 2022-02-07 LAB — CBC
Hematocrit: 45 % (ref 37.5–51.0)
Hemoglobin: 15.3 g/dL (ref 13.0–17.7)
MCH: 30.4 pg (ref 26.6–33.0)
MCHC: 34 g/dL (ref 31.5–35.7)
MCV: 89 fL (ref 79–97)
Platelets: 268 10*3/uL (ref 150–450)
RBC: 5.04 x10E6/uL (ref 4.14–5.80)
RDW: 12.6 % (ref 11.6–15.4)
WBC: 5 10*3/uL (ref 3.4–10.8)

## 2022-02-07 NOTE — Telephone Encounter (Signed)
   Name: Robert [name]  DOB: 03/01/1961  MRN: [id number]  Primary Cardiologist: None  Chart reviewed as part of pre-operative protocol coverage. Because of [name]'s past medical history and time since last visit, he will require a follow-up telephone visit in order to better assess preoperative cardiovascular risk.  Pre-op covering staff: - Please schedule appointment and call patient to inform them. If patient already had an upcoming appointment within acceptable timeframe, please add "pre-op clearance" to the appointment notes so provider is aware. - Please contact requesting surgeon's office via preferred method (i.e, phone, fax) to inform them of need for appointment prior to surgery.  CrCl 85 Platelet count: 268K   Patient can hold warfarin for 5 days before procedure. Patient WILL need Lovenox bridging. This will be organized by coumadin clinic.  Elgie Collard, PA-C  02/07/2022, 7:49 AM

## 2022-02-07 NOTE — Telephone Encounter (Signed)
Pt agreeable to tele pre op appt 03/06/22 @ 2:40. Pt see's CVRR at 1 pm 03/06/22. Pt thanked me for the help.  Med rec and consent are done.     Patient Consent for Virtual Visit        Robert [name] has provided verbal consent on 02/07/2022 for a virtual visit (video or telephone).   CONSENT FOR VIRTUAL VISIT FOR:  Robert [name]  By participating in this virtual visit I agree to the following:  I hereby voluntarily request, consent and authorize [facility] and its employed or contracted physicians, physician assistants, nurse practitioners or other licensed health care professionals (the Practitioner), to provide me with telemedicine health care services (the "Services") as deemed necessary by the treating Practitioner. I acknowledge and consent to receive the Services by the Practitioner via telemedicine. I understand that the telemedicine visit will involve communicating with the Practitioner through live audiovisual communication technology and the disclosure of certain medical information by electronic transmission. I acknowledge that I have been given the opportunity to request an in-person assessment or other available alternative prior to the telemedicine visit and am voluntarily participating in the telemedicine visit.  I understand that I have the right to withhold or withdraw my consent to the use of telemedicine in the course of my care at any time, without affecting my right to future care or treatment, and that the Practitioner or I may terminate the telemedicine visit at any time. I understand that I have the right to inspect all information obtained and/or recorded in the course of the telemedicine visit and may receive copies of available information for a reasonable fee.  I understand that some of the potential risks of receiving the Services via telemedicine include:  Delay or interruption in medical evaluation due to technological equipment failure or  disruption; Information transmitted may not be sufficient (e.g. poor resolution of images) to allow for appropriate medical decision making by the Practitioner; and/or  In rare instances, security protocols could fail, causing a breach of personal health information.  Furthermore, I acknowledge that it is my responsibility to provide information about my medical history, conditions and care that is complete and accurate to the best of my ability. I acknowledge that Practitioner's advice, recommendations, and/or decision may be based on factors not within their control, such as incomplete or inaccurate data provided by me or distortions of diagnostic images or specimens that may result from electronic transmissions. I understand that the practice of medicine is not an exact science and that Practitioner makes no warranties or guarantees regarding treatment outcomes. I acknowledge that a copy of this consent can be made available to me via my patient portal ([place]), or I can request a printed copy by calling the office of [facility].    I understand that my insurance will be billed for this visit.   I have read or had this consent read to me. I understand the contents of this consent, which adequately explains the benefits and risks of the Services being provided via telemedicine.  I have been provided ample opportunity to ask questions regarding this consent and the Services and have had my questions answered to my satisfaction. I give my informed consent for the services to be provided through the use of telemedicine in my medical care

## 2022-02-07 NOTE — Telephone Encounter (Signed)
Pt agreeable to tele pre op appt 03/06/22 @ 2:40. Pt see's CVRR at 1 pm 03/06/22. Pt thanked me for the help.  Med rec and consent are done.

## 2022-02-07 NOTE — Telephone Encounter (Signed)
Patient with diagnosis of prior CVA, prior DVT, mechanical AVR on warfarin for anticoagulation.     Procedure: Bilateral Microscopic Vasectomy  Reversal Vasovasostomy  Versu Vasoepididymostomye Date of procedure: 03/13/2022   CrCl 85 Platelet count: 268K   Patient can hold warfarin for 5 days before procedure. Patient WILL need Lovenox bridging. This will be organized by coumadin clinic.

## 2022-03-06 ENCOUNTER — Telehealth: Payer: 59

## 2022-03-06 ENCOUNTER — Other Ambulatory Visit: Payer: Self-pay

## 2022-03-06 ENCOUNTER — Ambulatory Visit: Payer: 59

## 2022-03-06 MED ORDER — ENOXAPARIN SODIUM 80 MG/0.8ML IJ SOSY: 80.0000 mg | PREFILLED_SYRINGE | Freq: Two times a day (BID) | INTRAMUSCULAR | 1 refills | Status: DC

## 2022-03-07 ENCOUNTER — Ambulatory Visit (INDEPENDENT_AMBULATORY_CARE_PROVIDER_SITE_OTHER): Payer: 59 | Admitting: *Deleted

## 2022-03-07 ENCOUNTER — Ambulatory Visit: Payer: 59 | Attending: Cardiovascular Disease | Admitting: Physician Assistant

## 2022-03-07 DIAGNOSIS — Z01810 Encounter for preprocedural cardiovascular examination: Secondary | ICD-10-CM

## 2022-03-07 DIAGNOSIS — Z7901 Long term (current) use of anticoagulants: Secondary | ICD-10-CM

## 2022-03-07 DIAGNOSIS — Z952 Presence of prosthetic heart valve: Secondary | ICD-10-CM | POA: Diagnosis not present

## 2022-03-07 DIAGNOSIS — Z5181 Encounter for therapeutic drug level monitoring: Secondary | ICD-10-CM

## 2022-03-07 DIAGNOSIS — Z8673 Personal history of transient ischemic attack (TIA), and cerebral infarction without residual deficits: Secondary | ICD-10-CM

## 2022-03-07 LAB — POCT INR: INR: 2.4 (ref 2.0–3.0)

## 2022-03-07 NOTE — Progress Notes (Signed)
Virtual Visit via Telephone Note   Because of Robert [name]'s co-morbid illnesses, he is at least at moderate risk for complications without adequate follow up.  This format is felt to be most appropriate for this patient at this time.  The patient did not have access to video technology/had technical difficulties with video requiring transitioning to audio format only (telephone).  All issues noted in this document were discussed and addressed.  No physical exam could be performed with this format.  Please refer to the patient's chart for his consent to telehealth for [name].  Evaluation Performed:  Preoperative cardiovascular risk assessment _____________   Date:  03/07/2022   Patient ID:  Robert [name], DOB 1961/03/24, MRN [id number] Patient Location:  Home Provider location:   Office  Primary Care Provider:  [name], MD Primary Cardiologist:  None  Chief Complaint / Patient Profile   61 y.o. y/o male with a h/o hyperlipidemia, DVT, CVA, anxiety, and ascending aortic aneurysm who is pending  Bilateral Microscopic Vasectomy  Reversal Vasovasostomy  Versu Vasoepididymostomye  and presents today for telephonic preoperative cardiovascular risk assessment.   History of Present Illness    Robert [name] is a 61 y.o. male who presents via audio/video conferencing for a telehealth visit today.  Pt was last seen in cardiology clinic on 06/27/21 by Dr. Oval Linsey.  At that time Robert [name] was doing well other than some palpitations.  The patient is now pending procedure as outlined above. Since his last visit, he has been doing really well without any chest pain, shortness of breath, palpitations.  He runs 6 miles a day and when he is not running he uses his rowing machine.  He is very active.  For this reason he is scored a 7.53 METS on the DASI.  This well exceeds the 4 METS minimum requirement.  Patient can hold warfarin for 5 days before procedure. Patient WILL need Lovenox  bridging. This will be organized by coumadin clinic. He has done these shots in the past before procedures and surgeries.  Past Medical History    Past Medical History:  Diagnosis Date   Aneurysm of ascending aorta ([facility])    Anxiety    Blood transfusion without reported diagnosis    Bradycardia 06/27/2021   Cataract    BEGINNING   CVA (cerebral infarction)    DVT (deep venous thrombosis) ([facility])    Hx of adenomatous colonic polyps 05/11/2014   Hyperlipidemia    Migraine headache    Renal stone    S/P aortic valve replacement    Stroke ([place])    Subacute bacterial endocarditis (SBE)    Past Surgical History:  Procedure Laterality Date   ABDOMINAL AORTIC ANEURYSM REPAIR     AORTIC VALVE REPLACEMENT  2007   CARDIAC CATHETERIZATION  01/2005   COLON SURGERY  2010   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 Other  reaction(s): Other (See Comments) Makes skin raw Inflammation and TEARS OFF THE SKIN!!   Tapentadol Other (See Comments) and Rash    Other reaction(s): Other (See Comments) Makes skin raw Inflammation and TEARS OFF THE SKIN!! Inflammation and TEARS OFF THE SKIN!!   Lipitor [Atorvastatin]     Issues processing lactic acid    Home Medications    Prior to Admission medications   Medication Sig Start Date End Date Taking? Authorizing Provider  acetaminophen (TYLENOL) 500 MG tablet Take 1,000 mg by mouth 4 (four) times daily as needed for  pain.     [provider]  enoxaparin (LOVENOX) 80 MG/0.8ML injection Inject 0.8 mLs (80 mg total) into the skin every 12 (twelve) hours. Patient not taking: Reported on 08/03/2021 10/23/18   Tommy Medal L, RPH-CPP  enoxaparin (LOVENOX) 80 MG/0.8ML injection Inject 0.8 mLs (80 mg total) into the skin every 12 (twelve) hours. 03/06/22   Skeet Latch, MD  gabapentin (NEURONTIN) 400 MG capsule Take 400 mg by mouth as needed.    [provider]  rosuvastatin (CRESTOR) 5 MG tablet TAKE 1 TABLET BY MOUTH THREE TIMES WEEKLY 10/27/21   Skeet Latch, MD  warfarin (COUMADIN) 5 MG tablet TAKE 1-2 TABLETS BY MOUTH DAILY OR AS DIRECTED BY COUMADIN CLINIC. 08/15/21   Skeet Latch, MD    Physical Exam    Vital Signs:  Madaline Savage does not have vital signs available for review today.  Given telephonic nature of communication, physical exam is limited. AAOx3. NAD. Normal affect.  Speech and respirations are unlabored.  Accessory Clinical Findings    None  Assessment & Plan    1.  Preoperative Cardiovascular Risk Assessment:  Mr. Robert [name]'s perioperative risk of a major cardiac event is 0.9% according to the Revised Cardiac Risk Index (RCRI).  Therefore, he is at low risk for perioperative complications.   His functional capacity is excellent at 7.53 METs according to the [place] Activity Status Index (DASI). Recommendations: According to ACC/AHA guidelines, no further cardiovascular testing needed.  The patient may proceed to surgery at acceptable risk.   Antiplatelet and/or Anticoagulation Recommendations:  Coumadin can by held for 5 days prior to surgery.  Please resume post op when felt to be safe.  The patient was advised that if he develops new symptoms prior to surgery to contact our office to arrange for a follow-up visit, and he verbalized understanding.  A copy of this note will be routed to requesting surgeon.  Time:   Today, I have spent 30 minutes with the  patient with telehealth technology discussing medical history, symptoms, and management plan.     Elgie Collard, PA-C  03/07/2022, 8:26 AM

## 2022-03-07 NOTE — Patient Instructions (Addendum)
Description   Continue taking 1.5 tablets warfarin daily. Please refer to pt instructions.  Call coumadin clinic [phone] if you have missed a dose or had any recent changes in medications or upcoming procedures. Procedure 12/19 preop in progress. Recheck INR in 1 week post procedure.      03/07/22: Last dose of warfarin.  03/08/22: No warfarin or enoxaparin (Lovenox).  03/09/22: Inject enoxaparin '80mg'$  in the fatty abdominal tissue at least 2 inches from the belly button twice a day about 12 hours apart, 8am and 8pm rotate sites. No warfarin.  03/10/22: Inject enoxaparin in the fatty tissue every 12 hours, 8am and 8pm. No warfarin.  03/11/22: Inject enoxaparin in the fatty tissue every 12 hours, 8am and 8pm. No warfarin.  03/12/22: Inject enoxaparin in the fatty tissue in the morning at 8 am (No PM dose). No warfarin.  03/13/22: Procedure Day - No enoxaparin - Resume warfarin in the evening or as directed by doctor.  03/14/22: Resume enoxaparin inject in the fatty tissue every 12 hours and take warfarin  03/15/22: Inject enoxaparin in the fatty tissue every 12 hours and take warfarin  03/16/22: Inject enoxaparin in the fatty tissue every 12 hours and take warfarin  03/17/22: Inject enoxaparin in the fatty tissue every 12 hours and take warfarin  03/18/22: Inject enoxaparin in the fatty tissue every 12 hours and take warfarin  03/19/22: Inject enoxaparin in the fatty tissue every 12 hours and take warfarin  03/20/22: Inject enoxaparin in the fatty tissue at 8am then report to warfarin appt to check INR.

## 2022-03-20 ENCOUNTER — Ambulatory Visit: Payer: 59 | Attending: Cardiology

## 2022-03-20 DIAGNOSIS — Z952 Presence of prosthetic heart valve: Secondary | ICD-10-CM

## 2022-03-20 DIAGNOSIS — Z8673 Personal history of transient ischemic attack (TIA), and cerebral infarction without residual deficits: Secondary | ICD-10-CM | POA: Diagnosis not present

## 2022-03-20 DIAGNOSIS — Z7901 Long term (current) use of anticoagulants: Secondary | ICD-10-CM | POA: Diagnosis not present

## 2022-03-20 DIAGNOSIS — Z5181 Encounter for therapeutic drug level monitoring: Secondary | ICD-10-CM

## 2022-03-20 LAB — POCT INR: INR: 2.5 (ref 2.0–3.0)

## 2022-03-20 NOTE — Patient Instructions (Signed)
Continue taking 1.5 tablets warfarin daily. INR in 6 weeks Call coumadin clinic [phone] if you have missed a dose or had any recent changes in medications or upcoming procedures.

## 2022-03-21 ENCOUNTER — Encounter: Payer: Self-pay | Admitting: Internal Medicine

## 2022-03-22 ENCOUNTER — Encounter: Payer: Self-pay | Admitting: Family Medicine

## 2022-03-26 HISTORY — PX: VASECTOMY REVERSAL: SHX243

## 2022-03-29 ENCOUNTER — Other Ambulatory Visit: Payer: Self-pay | Admitting: Cardiovascular Disease

## 2022-03-29 DIAGNOSIS — Z952 Presence of prosthetic heart valve: Secondary | ICD-10-CM

## 2022-03-29 NOTE — Telephone Encounter (Signed)
Prescription refill request received for warfarin Lov: 03/07/22 Harriet Pho)  Next INR check: 04/30/22 Warfarin tablet strength: '5mg'$   Appropriate dose and refill sent to requested pharmacy.

## 2022-03-29 NOTE — Telephone Encounter (Signed)
Please review for refill. Thank you! 

## 2022-04-02 ENCOUNTER — Other Ambulatory Visit: Payer: Self-pay

## 2022-04-02 DIAGNOSIS — Z952 Presence of prosthetic heart valve: Secondary | ICD-10-CM

## 2022-04-02 MED ORDER — WARFARIN SODIUM 5 MG PO TABS: ORAL_TABLET | ORAL | 0 refills | Status: DC

## 2022-04-30 ENCOUNTER — Ambulatory Visit: Payer: 59

## 2022-05-03 ENCOUNTER — Ambulatory Visit: Payer: 59

## 2022-05-08 ENCOUNTER — Ambulatory Visit: Payer: 59 | Attending: Cardiology | Admitting: *Deleted

## 2022-05-08 DIAGNOSIS — Z952 Presence of prosthetic heart valve: Secondary | ICD-10-CM

## 2022-05-08 DIAGNOSIS — Z7901 Long term (current) use of anticoagulants: Secondary | ICD-10-CM

## 2022-05-08 DIAGNOSIS — Z5181 Encounter for therapeutic drug level monitoring: Secondary | ICD-10-CM

## 2022-05-08 LAB — POCT INR: POC INR: 2.7

## 2022-05-08 NOTE — Patient Instructions (Signed)
Description   Continue taking 1.5 tablets warfarin daily. INR in 6 weeks Call coumadin clinic [phone] if you have missed a dose or had any recent changes in medications or upcoming procedures.

## 2022-06-19 ENCOUNTER — Ambulatory Visit: Payer: 59

## 2022-06-25 ENCOUNTER — Telehealth: Payer: Self-pay

## 2022-06-25 ENCOUNTER — Ambulatory Visit: Payer: 59 | Attending: Cardiology | Admitting: Pharmacist

## 2022-06-25 ENCOUNTER — Ambulatory Visit: Payer: 59 | Admitting: Internal Medicine

## 2022-06-25 ENCOUNTER — Encounter: Payer: Self-pay | Admitting: Internal Medicine

## 2022-06-25 VITALS — BP 130/68 | HR 52 | Ht 71.0 in | Wt 180.0 lb

## 2022-06-25 DIAGNOSIS — Z86010 Personal history of colonic polyps: Secondary | ICD-10-CM | POA: Diagnosis not present

## 2022-06-25 DIAGNOSIS — Z952 Presence of prosthetic heart valve: Secondary | ICD-10-CM | POA: Diagnosis not present

## 2022-06-25 DIAGNOSIS — Z8673 Personal history of transient ischemic attack (TIA), and cerebral infarction without residual deficits: Secondary | ICD-10-CM

## 2022-06-25 DIAGNOSIS — Z7901 Long term (current) use of anticoagulants: Secondary | ICD-10-CM

## 2022-06-25 LAB — POCT INR: POC INR: 2.8

## 2022-06-25 MED ORDER — NA SULFATE-K SULFATE-MG SULF 17.5-3.13-1.6 GM/177ML PO SOLN: 1.0000 | Freq: Once | ORAL | 0 refills | Status: AC

## 2022-06-25 NOTE — Patient Instructions (Addendum)
You have been scheduled for a colonoscopy. Please follow written instructions given to you at your visit today.  Please pick up your prep supplies at the pharmacy within the next 1-3 days. If you use inhalers (even only as needed), please bring them with you on the day of your procedure.  _______________________________________________________  If your blood pressure at your visit was 140/90 or greater, please contact your primary care physician to follow up on this.  _______________________________________________________  If you are age 53 or older, your body mass index should be between 23-30. Your Body mass index is 25.1 kg/m. If this is out of the aforementioned range listed, please consider follow up with your Primary Care Provider.  If you are age 21 or younger, your body mass index should be between 19-25. Your Body mass index is 25.1 kg/m. If this is out of the aformentioned range listed, please consider follow up with your Primary Care Provider.   ________________________________________________________  The  GI providers would like to encourage you to use [facility] to communicate with providers for non-urgent requests or questions.  Due to long hold times on the telephone, sending your provider a message by [facility] may be a faster and more efficient way to get a response.  Please allow 48 business hours for a response.  Please remember that this is for non-urgent requests.  _______________________________________________________ It was a pleasure to see you today!  Thank you for trusting me with your gastrointestinal care!

## 2022-06-25 NOTE — Progress Notes (Signed)
Robert [name] 62 y.o. 09-15-1960 [id number]  Assessment & Plan:   Encounter Diagnoses  Name Primary?   Hx of adenomatous colonic polyps Yes   S/P AVR (aortic valve replacement)    Chronic anticoagulation    History of CVA (cerebrovascular accident)   Also with 2 grandparents having colon cancer.  Schedule surveillance colonoscopy.  Hold warfarin 5 days and employ Lovenox bridge with the help of the anticoagulation clinic and [facility].  He is aware of the need for the Lovenox bridge to try to reduce the risk of stroke while off his warfarin.  The risks and benefits as well as alternatives of endoscopic procedure(s) have been discussed and reviewed. All questions answered. The patient agrees to proceed. The patient requests unsedated colonoscopy exam which she has done before.    Subjective:   Chief Complaint: History of colon polyps on warfarin  HPI 62 year old white man with a history of adenomatous colonic polyps who presents for surveillance colonoscopy.  He is seen in the office because he takes warfarin for history of aortic valve replacement (x 2) and he is also had a history of a stroke.  When he has his colonoscopy or other procedures he does a Lovenox bridge with the warfarin because of this history.  He is not having any GI issues.  Last colonoscopy 2019.  4 diminutive adenomas in 2016 and 3 diminutive adenomas December 2019. Allergies  Allergen Reactions   Tape Rash and Other (See Comments)    Other reaction(s): Other (See Comments) Makes skin raw Inflammation and TEARS OFF THE SKIN!! Other reaction(s): Other (See Comments) Makes skin raw Inflammation and TEARS OFF THE SKIN!!   Tapentadol Other (See Comments) and Rash    Other reaction(s): Other (See Comments) Makes skin raw Inflammation and TEARS OFF THE SKIN!! Inflammation and TEARS OFF THE SKIN!!   Lipitor [Atorvastatin]     Issues processing lactic acid   Current Meds  Medication Sig    gabapentin (NEURONTIN) 400 MG capsule Take 400 mg by mouth as needed.       warfarin (COUMADIN) 5 MG tablet Take 1-2 tablets Daily or as prescribed by Coumadin Clinic   Past Medical History:  Diagnosis Date   Aneurysm of ascending aorta    Anxiety    Blood transfusion without reported diagnosis    Bradycardia 06/27/2021   Cataract    BEGINNING   CVA (cerebral infarction)    DVT (deep venous thrombosis)    Hx of adenomatous colonic polyps 05/11/2014   Hyperlipidemia    Migraine headache    Renal stone    S/P aortic valve replacement    Stroke    Subacute bacterial endocarditis (SBE)    Past Surgical History:  Procedure Laterality Date   ABDOMINAL AORTIC ANEURYSM REPAIR     AORTIC VALVE REPLACEMENT  2007   CARDIAC CATHETERIZATION  01/2005   COLON SURGERY  2010   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.    Patient has 8  children. Boys   Patient is an Art gallery manager.         family history includes Bladder Cancer in his maternal grandfather; CVA (age of onset: 80) in his mother; Colon cancer in his paternal grandfather and paternal grandmother.   Review of Systems  As per HPI otherwise negative Objective:   Physical Exam @BP  130/68   Pulse (!) 52   Ht 5\' 11"  (1.803 m)   Wt 180 lb (81.6 kg)   BMI 25.10 kg/m @  General:  NAD Eyes:   anicteric Lungs:  clear Heart::  S1S2 no rubs, murmurs or gallops + S2 metallic sound Abdomen:  soft and nontender, BS+ Ext:   no edema, cyanosis or clubbing    Data  Reviewed:  As per HPI

## 2022-06-25 NOTE — Patient Instructions (Signed)
Continue taking 1.5 tablets warfarin daily. INR in 6 weeks Call coumadin clinic [phone] if you have missed a dose or had any recent changes in medications or upcoming procedures.  

## 2022-06-25 NOTE — Telephone Encounter (Signed)
  [facility] Pre-operative Risk Assessment     Request for surgical clearance:     Endoscopy Procedure  What type of surgery is being performed?     colonoscopy  When is this surgery scheduled?     07/11/2022  What type of clearance is required ?   Pharmacy  Are there any medications that need to be held prior to surgery and how long? Warfarin- lovenox bridging.   Practice name and name of physician performing surgery?      [facility]  What is your office phone and fax number?      Phone- [phone]  Fax [phone]  Anesthesia type (None, local, MAC, general) ?       MAC

## 2022-06-25 NOTE — Telephone Encounter (Signed)
Patient with diagnosis of CVA, DVT and mechanical aortic valve on warfarin for anticoagulation.    Procedure: colonoscopy  Date of procedure: 07/11/22  CrCl 103 ml/min Platelet count 268  Per office protocol, patient can hold warfarin for 5 days prior to procedure.    Patient WILL need bridging with Lovenox (enoxaparin) around procedure.  **This guidance is not considered finalized until pre-operative APP has relayed final recommendations.**   4/11: Last dose of warfarin.  4/12: No warfarin or enoxaparin (Lovenox).  4/13: Inject enoxaparin 80mg  in the fatty abdominal tissue at least 2 inches from the belly button twice a day about 12 hours apart, 8am and 8pm rotate sites. No warfarin.  4/14: Inject enoxaparin in the fatty tissue every 12 hours, 8am and 8pm. No warfarin.  4/15: Inject enoxaparin in the fatty tissue every 12 hours, 8am and 8pm. No warfarin.  4/16: Inject enoxaparin in the fatty tissue in the morning at 8 am (No PM dose). No warfarin.  4/17: Procedure Day - No enoxaparin - Resume warfarin in the evening or as directed by doctor (take an extra half tablet with usual dose for 2 days then resume normal dose).  4/18: Resume enoxaparin inject in the fatty tissue every 12 hours and take warfarin  4/19: Inject enoxaparin in the fatty tissue every 12 hours and take warfarin  4/20: Inject enoxaparin in the fatty tissue every 12 hours and take warfarin  4/21: Inject enoxaparin in the fatty tissue every 12 hours and take warfarin  4/22: Inject enoxaparin in the fatty tissue every 12 hours and take warfarin  4/23: warfarin appt to check INR.  Instructions have been reviewed and sent to patient. He is comfortable with injections. Has done before. Still has 4 syringes at home. F/u INR moved to 4/23.

## 2022-06-26 ENCOUNTER — Encounter: Payer: Self-pay | Admitting: Pharmacist

## 2022-06-26 MED ORDER — ENOXAPARIN SODIUM 80 MG/0.8ML IJ SOSY: 80.0000 mg | PREFILLED_SYRINGE | Freq: Two times a day (BID) | INTRAMUSCULAR | 0 refills | Status: DC

## 2022-06-26 NOTE — Telephone Encounter (Signed)
   Patient Name: ARMEEN CAPULONG  DOB: Jan 01, 1961 MRN: [id number]  Primary Cardiologist: None  Clinical pharmacists have reviewed the patient's past medical history, labs, and current medications as part of preoperative protocol coverage. The following recommendations have been made:  Per office protocol, patient can hold warfarin for 5 days prior to procedure.     Patient WILL need bridging with Lovenox (enoxaparin) around procedure.   **This guidance is not considered finalized until pre-operative APP has relayed final recommendations.**   4/11: Last dose of warfarin.   4/12: No warfarin or enoxaparin (Lovenox).   4/13: Inject enoxaparin 80mg  in the fatty abdominal tissue at least 2 inches from the belly button twice a day about 12 hours apart, 8am and 8pm rotate sites. No warfarin.   4/14: Inject enoxaparin in the fatty tissue every 12 hours, 8am and 8pm. No warfarin.   4/15: Inject enoxaparin in the fatty tissue every 12 hours, 8am and 8pm. No warfarin.   4/16: Inject enoxaparin in the fatty tissue in the morning at 8 am (No PM dose). No warfarin.   4/17: Procedure Day - No enoxaparin - Resume warfarin in the evening or as directed by doctor (take an extra half tablet with usual dose for 2 days then resume normal dose).   4/18: Resume enoxaparin inject in the fatty tissue every 12 hours and take warfarin   4/19: Inject enoxaparin in the fatty tissue every 12 hours and take warfarin   4/20: Inject enoxaparin in the fatty tissue every 12 hours and take warfarin   4/21: Inject enoxaparin in the fatty tissue every 12 hours and take warfarin   4/22: Inject enoxaparin in the fatty tissue every 12 hours and take warfarin   4/23: warfarin appt to check INR.   Instructions have been reviewed and sent to patient. He is comfortable with injections. Has done before. Still has 4 syringes at home. F/u INR moved to 4/23.    I will route this recommendation to the requesting party via  Epic fax function and remove from pre-op pool.  Please call with questions.  Mable Fill, Marissa Nestle, NP 06/26/2022, 3:24 PM

## 2022-07-09 ENCOUNTER — Telehealth: Payer: Self-pay | Admitting: Internal Medicine

## 2022-07-09 NOTE — Telephone Encounter (Signed)
I have sent patient new instructions for his bridge and rescheduled his follow up appointment. Thanks for looping me in.

## 2022-07-09 NOTE — Telephone Encounter (Signed)
Patient has been rescheduled to 07/25/22 at 2:30pm New instructions have been put on My chart.

## 2022-07-09 NOTE — Telephone Encounter (Signed)
Patient states he forgot to stop his warfarin on 07/05/22. Also, patient states he has was suppose to also be on Lovenox bridge which he has not started. Informed patient that he would have to reschedule his procedure scheduled for 07/11/22 but I will send the message to Dr. Leone Payor to inform him and see if he has any other suggestions for the patient.

## 2022-07-09 NOTE — Telephone Encounter (Signed)
He does need to reschedule and hold warfarin and do the Lovenox bridge.  I have cced Melissa,Maccia - [facility] pharmacist to loop her in on this (thanks to all)

## 2022-07-09 NOTE — Telephone Encounter (Signed)
Inbound call from patient, his procedure is scheduled for 4/17. Patient states he go confused and thought it was next week and took a dose of Warfarin last night. Patient states he forgot the last day to take it was 4/11. Would like to discuss further with a nurse, if it is safe to proceed with procedure.

## 2022-07-11 ENCOUNTER — Encounter: Payer: 59 | Admitting: Internal Medicine

## 2022-07-16 DIAGNOSIS — G629 Polyneuropathy, unspecified: Secondary | ICD-10-CM | POA: Diagnosis not present

## 2022-07-16 DIAGNOSIS — G43909 Migraine, unspecified, not intractable, without status migrainosus: Secondary | ICD-10-CM | POA: Diagnosis not present

## 2022-07-16 DIAGNOSIS — Z8673 Personal history of transient ischemic attack (TIA), and cerebral infarction without residual deficits: Secondary | ICD-10-CM | POA: Diagnosis not present

## 2022-07-16 DIAGNOSIS — Z7901 Long term (current) use of anticoagulants: Secondary | ICD-10-CM | POA: Diagnosis not present

## 2022-07-16 DIAGNOSIS — D6869 Other thrombophilia: Secondary | ICD-10-CM | POA: Diagnosis not present

## 2022-07-17 ENCOUNTER — Ambulatory Visit: Payer: 59

## 2022-07-25 ENCOUNTER — Ambulatory Visit (AMBULATORY_SURGERY_CENTER): Payer: 59 | Admitting: Internal Medicine

## 2022-07-25 ENCOUNTER — Encounter: Payer: Self-pay | Admitting: Internal Medicine

## 2022-07-25 VITALS — BP 132/68 | HR 55 | Temp 97.7°F | Resp 17 | Ht 71.0 in | Wt 180.0 lb

## 2022-07-25 DIAGNOSIS — D122 Benign neoplasm of ascending colon: Secondary | ICD-10-CM | POA: Diagnosis not present

## 2022-07-25 DIAGNOSIS — D124 Benign neoplasm of descending colon: Secondary | ICD-10-CM | POA: Diagnosis not present

## 2022-07-25 DIAGNOSIS — Z86010 Personal history of colonic polyps: Secondary | ICD-10-CM | POA: Diagnosis not present

## 2022-07-25 DIAGNOSIS — Z09 Encounter for follow-up examination after completed treatment for conditions other than malignant neoplasm: Secondary | ICD-10-CM

## 2022-07-25 DIAGNOSIS — D123 Benign neoplasm of transverse colon: Secondary | ICD-10-CM | POA: Diagnosis not present

## 2022-07-25 MED ORDER — SODIUM CHLORIDE 0.9 % IV SOLN: 500.0000 mL | Freq: Once | INTRAVENOUS | Status: DC

## 2022-07-25 NOTE — Patient Instructions (Addendum)
I found and removed 4 tiny polyps that all look benign.  I will let you know pathology results and when to have another routine colonoscopy by mail and/or My Chart.  I appreciate the opportunity to care for you. Iva Boop, MD, [facility]   Resume Coumadin (warfarin) today and Lovenox (enoxaparin) tomorrow at prior doses. Refer to Coumadin Clinic for further adjustment of therapy.                                                     YOU HAD AN ENDOSCOPIC PROCEDURE TODAY AT THE [facility]:   Refer to the procedure report that was given to you for any specific questions about what was found during the examination.  If the procedure report does not answer your questions, please call your gastroenterologist to clarify.  If you requested that your care partner not be given the details of your procedure findings, then the procedure report has been included in a sealed envelope for you to review at your convenience later.  YOU SHOULD EXPECT: Some feelings of bloating in the abdomen. Passage of more gas than usual.  Walking can help get rid of the air that was put into your GI tract during the procedure and reduce the bloating. If you had a lower endoscopy (such as a colonoscopy or flexible sigmoidoscopy) you may notice spotting of blood in your stool or on the toilet paper. If you underwent a bowel prep for your procedure, you may not have a normal bowel movement for a few days.  Please Note:  You might notice some irritation and congestion in your nose or some drainage.  This is from the oxygen used during your procedure.  There is no need for concern and it should clear up in a day or so.  SYMPTOMS TO REPORT IMMEDIATELY:  Following lower endoscopy (colonoscopy or flexible sigmoidoscopy):  Excessive amounts of blood in the stool  Significant tenderness or worsening of abdominal pains  Swelling of the abdomen that is new, acute  Fever of 100F or higher  For urgent or emergent issues, a  gastroenterologist can be reached at any hour by calling [phone]. Do not use MyChart messaging for urgent concerns.    DIET:  We do recommend a small meal at first, but then you may proceed to your regular diet.  Drink plenty of fluids but you should avoid alcoholic beverages for 24 hours.   FOLLOW UP: Our staff will call the number listed on your records the next business day following your procedure.  We will call around 7:15- 8:00 am to check on you and address any questions or concerns that you may have regarding the information given to you following your procedure. If we do not reach you, we will leave a message.     If any biopsies were taken you will be contacted by phone or by letter within the next 1-3 weeks.  Please call us at [phone] if you have not heard about the biopsies in 3 weeks.    SIGNATURES/CONFIDENTIALITY: You and/or your care partner have signed paperwork which will be entered into your electronic medical record.  These signatures attest to the fact that that the information above on your After Visit Summary has been reviewed and is understood.  Full responsibility of the confidentiality of this  discharge information lies with you and/or your care-partner.

## 2022-07-25 NOTE — Op Note (Addendum)
[facility] Patient Name: Robert [name] Procedure Date: 07/25/2022 2:32 PM MRN: [id number] Endoscopist: Iva Boop , MD, [id number] Age: 62 Referring MD:  Date of Birth: [birth year] Gender: Male Account #: [id number] Procedure:                Colonoscopy Indications:              Surveillance: Personal history of adenomatous                            polyps on last colonoscopy > 3 years ago, Last                            colonoscopy: December 2019 Medicines:                None Procedure:                Pre-Anesthesia Assessment:                           - Prior to the procedure, a History and Physical                            was performed, and patient medications and                            allergies were reviewed. The patient's tolerance of                            previous anesthesia was also reviewed. The risks                            and benefits of the procedure and the sedation                            options and risks were discussed with the patient.                            All questions were answered, and informed consent                            was obtained. Prior Anticoagulants: The patient                            last took Coumadin (warfarin) 5 days and Lovenox                            (enoxaparin) 1 day prior to the procedure. ASA                            Grade Assessment: III - A patient with severe                            systemic disease. After reviewing the risks and  benefits, the patient was deemed in satisfactory                            condition to undergo the procedure.                           After obtaining informed consent, the colonoscope                            was passed under direct vision. Throughout the                            procedure, the patient's blood pressure, pulse, and                            oxygen saturations were monitored continuously. The                             Olympus CF-HQ190L ([id number]) Colonoscope was                            introduced through the anus and advanced to the the                            cecum, identified by appendiceal orifice and                            ileocecal valve. The colonoscopy was performed                            without difficulty. The patient tolerated the                            procedure well. The quality of the bowel                            preparation was good. The ileocecal valve,                            appendiceal orifice, and rectum were photographed.                            The bowel preparation used was SUPREP via split                            dose instruction. Scope In: 2:41:10 PM Scope Out: 3:00:11 PM Scope Withdrawal Time: 0 hours 15 minutes 37 seconds  Total Procedure Duration: 0 hours 19 minutes 1 second  Findings:                 The perianal and digital rectal examinations were                            normal. Pertinent negatives include normal prostate                            (  size, shape, and consistency).                           Four sessile polyps were found in the descending                            colon, transverse colon and ascending colon. The                            polyps were diminutive in size. These polyps were                            removed with a cold snare. Resection and retrieval                            were complete. Verification of patient                            identification for the specimen was done. Estimated                            blood loss was minimal.                           The exam was otherwise without abnormality on                            direct and retroflexion views. Complications:            No immediate complications. Estimated Blood Loss:     Estimated blood loss was minimal. Impression:               - Four diminutive polyps in the descending colon,                            in the transverse  colon and in the ascending colon,                            removed with a cold snare. Resected and retrieved.                           - The examination was otherwise normal on direct                            and retroflexion views.                           - Personal history of colonic polyps. 04/2014 - 4                            diminutive adenomas                           02/24/2018 3 diminutive polyps removed adenomas Recommendation:           - Patient has a  contact number available for                            emergencies. The signs and symptoms of potential                            delayed complications were discussed with the                            patient. Return to normal activities tomorrow.                            Written discharge instructions were provided to the                            patient.                           - Resume previous diet.                           - Continue present medications.                           - Repeat colonoscopy is recommended for                            surveillance. The colonoscopy date will be                            determined after pathology results from today's                            exam become available for review.                           - Resume Coumadin (warfarin) today and Lovenox                            (enoxaparin) tomorrow at prior doses. Refer to                            Coumadin Clinic for further adjustment of therapy. Iva Boop, MD 07/25/2022 3:14:12 PM This report has been signed electronically.

## 2022-07-25 NOTE — Progress Notes (Signed)
Pt's states no medical or surgical changes since previsit or office visit. 

## 2022-07-25 NOTE — Progress Notes (Signed)
Called to room to assist during endoscopic procedure.  Patient ID and intended procedure confirmed with present staff. Received instructions for my participation in the procedure from the performing physician.  

## 2022-07-25 NOTE — Progress Notes (Signed)
To pacu VSS. Report to RN.tb ?

## 2022-07-25 NOTE — Progress Notes (Signed)
History and Physical Interval Note:  07/25/2022 2:33 PM  Robert [name]  has presented today for endoscopic procedure(s), with the diagnosis of  Encounter Diagnosis  Name Primary?   Hx of adenomatous colonic polyps Yes  .  The various methods of evaluation and treatment have been discussed with the patient and/or family. After consideration of risks, benefits and other options for treatment, the patient has consented to  the endoscopic procedure(s).   The patient's history has been reviewed, patient examined, no change in status, stable for endoscopic procedure(s).  I have reviewed the patient's chart and labs.  Questions were answered to the patient's satisfaction.     Iva Boop, MD, [facility]

## 2022-07-25 NOTE — Progress Notes (Signed)
No sedation

## 2022-07-26 ENCOUNTER — Telehealth: Payer: Self-pay

## 2022-07-26 NOTE — Telephone Encounter (Signed)
  Follow up Call-     07/25/2022    1:45 PM  Call back number  Post procedure Call Back phone  # [phone]  Permission to leave phone message Yes     Patient questions:  Do you have a fever, pain , or abdominal swelling? No. Pain Score  0 *  Have you tolerated food without any problems? Yes.    Have you been able to return to your normal activities? Yes.    Do you have any questions about your discharge instructions: Diet   No. Medications  No. Follow up visit  No.  Do you have questions or concerns about your Care? No.  Actions: * If pain score is 4 or above: No action needed, pain <4.

## 2022-07-31 ENCOUNTER — Ambulatory Visit: Payer: 59 | Attending: Cardiology | Admitting: *Deleted

## 2022-07-31 DIAGNOSIS — Z952 Presence of prosthetic heart valve: Secondary | ICD-10-CM | POA: Diagnosis not present

## 2022-07-31 DIAGNOSIS — Z7901 Long term (current) use of anticoagulants: Secondary | ICD-10-CM

## 2022-07-31 DIAGNOSIS — Z5181 Encounter for therapeutic drug level monitoring: Secondary | ICD-10-CM

## 2022-07-31 LAB — POCT INR: POC INR: 2.4

## 2022-07-31 NOTE — Patient Instructions (Signed)
Description   Take 2 tablets of warfarin today and then continue taking 1.5 tablets of warfarin daily. INR in 6 weeks Call coumadin clinic [phone] if you have missed a dose or had any recent changes in medications or upcoming procedures.

## 2022-08-06 ENCOUNTER — Encounter: Payer: Self-pay | Admitting: Internal Medicine

## 2022-09-11 ENCOUNTER — Ambulatory Visit: Payer: 59 | Attending: Cardiology

## 2022-09-11 DIAGNOSIS — Z952 Presence of prosthetic heart valve: Secondary | ICD-10-CM

## 2022-09-11 DIAGNOSIS — Z7901 Long term (current) use of anticoagulants: Secondary | ICD-10-CM

## 2022-09-11 DIAGNOSIS — Z8673 Personal history of transient ischemic attack (TIA), and cerebral infarction without residual deficits: Secondary | ICD-10-CM | POA: Diagnosis not present

## 2022-09-11 LAB — POCT INR: INR: 4.3 — AB (ref 2.0–3.0)

## 2022-09-11 NOTE — Patient Instructions (Signed)
Description   Skip today's dosage of Warfarin and then resume same dosage of Warfarin 1.5 tablets of warfarin daily. INR in 4 weeks (usually 6 week). Call coumadin clinic [phone] if you have missed a dose or had any recent changes in medications or upcoming procedures.

## 2022-10-09 ENCOUNTER — Ambulatory Visit: Payer: 59

## 2022-10-11 ENCOUNTER — Ambulatory Visit: Payer: 59 | Attending: Internal Medicine | Admitting: *Deleted

## 2022-10-11 DIAGNOSIS — Z952 Presence of prosthetic heart valve: Secondary | ICD-10-CM | POA: Diagnosis not present

## 2022-10-11 DIAGNOSIS — Z8673 Personal history of transient ischemic attack (TIA), and cerebral infarction without residual deficits: Secondary | ICD-10-CM | POA: Diagnosis not present

## 2022-10-11 DIAGNOSIS — Z7901 Long term (current) use of anticoagulants: Secondary | ICD-10-CM

## 2022-10-11 LAB — POCT INR: INR: 3 (ref 2.0–3.0)

## 2022-10-11 NOTE — Patient Instructions (Signed)
Description   Continue taking Warfarin 1.5 tablets of warfarin daily. INR in 6 weeks. Call coumadin clinic [phone] if you have missed a dose or had any recent changes in medications or upcoming procedures.

## 2022-10-18 ENCOUNTER — Other Ambulatory Visit: Payer: Self-pay | Admitting: Cardiovascular Disease

## 2022-10-18 DIAGNOSIS — Z952 Presence of prosthetic heart valve: Secondary | ICD-10-CM

## 2022-10-18 NOTE — Telephone Encounter (Signed)
Please review for refill. Thank you! 

## 2022-10-18 NOTE — Telephone Encounter (Signed)
Prescription refill request received for warfarin Lov: 03/07/22 Asa Lente)  Next INR check: 11/22/22 Warfarin tablet strength: 5mg   Appropriate dose. Refill sent.

## 2022-11-22 ENCOUNTER — Ambulatory Visit: Payer: 59 | Attending: Cardiology | Admitting: *Deleted

## 2022-11-22 DIAGNOSIS — Z7901 Long term (current) use of anticoagulants: Secondary | ICD-10-CM

## 2022-11-22 DIAGNOSIS — Z8673 Personal history of transient ischemic attack (TIA), and cerebral infarction without residual deficits: Secondary | ICD-10-CM | POA: Diagnosis not present

## 2022-11-22 DIAGNOSIS — Z952 Presence of prosthetic heart valve: Secondary | ICD-10-CM

## 2022-11-22 LAB — POCT INR: INR: 3.8 — AB (ref 2.0–3.0)

## 2022-11-22 NOTE — Patient Instructions (Addendum)
Description   Do not take any warfarin today then continue taking Warfarin 1.5 tablets of warfarin daily. INR in 6 weeks. Call coumadin clinic [phone] if you have missed a dose or had any recent changes in medications or upcoming procedures.

## 2022-12-17 ENCOUNTER — Other Ambulatory Visit: Payer: Self-pay

## 2022-12-17 DIAGNOSIS — Z952 Presence of prosthetic heart valve: Secondary | ICD-10-CM

## 2022-12-17 MED ORDER — WARFARIN SODIUM 5 MG PO TABS
ORAL_TABLET | ORAL | 0 refills | Status: DC
Start: 2022-12-17 — End: 2023-02-26

## 2022-12-17 NOTE — Telephone Encounter (Signed)
Prescription refill request received for warfarin Lov: 03/07/22 Asa Lente)  Next INR check: 01/03/23 Warfarin tablet strength: 5mg   Appropriate dose. Refill sent.

## 2023-01-03 ENCOUNTER — Ambulatory Visit: Payer: 59

## 2023-01-11 ENCOUNTER — Ambulatory Visit: Payer: 59 | Attending: Internal Medicine

## 2023-01-11 DIAGNOSIS — Z8673 Personal history of transient ischemic attack (TIA), and cerebral infarction without residual deficits: Secondary | ICD-10-CM | POA: Diagnosis not present

## 2023-01-11 DIAGNOSIS — Z952 Presence of prosthetic heart valve: Secondary | ICD-10-CM

## 2023-01-11 DIAGNOSIS — Z7901 Long term (current) use of anticoagulants: Secondary | ICD-10-CM | POA: Diagnosis not present

## 2023-01-11 LAB — POCT INR: INR: 2.3 (ref 2.0–3.0)

## 2023-01-11 NOTE — Patient Instructions (Signed)
TAKE 2 TABLETS TODAY ONLY THEN continue taking Warfarin 1.5 tablets of warfarin daily. INR in 7 weeks. Call coumadin clinic [phone] if you have missed a dose or had any recent changes in medications or upcoming procedures.

## 2023-02-15 ENCOUNTER — Ambulatory Visit (INDEPENDENT_AMBULATORY_CARE_PROVIDER_SITE_OTHER): Payer: 59 | Admitting: Medical

## 2023-02-15 VITALS — BP 122/80 | HR 63 | Temp 96.8°F | Wt 178.4 lb

## 2023-02-15 DIAGNOSIS — J019 Acute sinusitis, unspecified: Secondary | ICD-10-CM

## 2023-02-15 MED ORDER — AMOXICILLIN 875 MG PO TABS: 875.0000 mg | ORAL_TABLET | Freq: Two times a day (BID) | ORAL | 0 refills | Status: AC

## 2023-02-15 NOTE — Progress Notes (Signed)
Subjective:  Robert [name] is a 63 y.o. male who presents for  Chief Complaint  Patient presents with   Sinusitis    Sinus infection x 2 weeks. Green and bloody mucous in the morning, fluid in ears, didn't want to fly last week due to the drainage, cough,  fever- 99.6 on Sunday.    Has sinus pressure, sinus infection x 10-14 days.   Has had mucous drainage, but now some blood tinged nasal discharge.  Has had some low grade fever.  Felt fluid in ears, some dizziness.   Some cough.  Had lots of congestion over past 2 weeks.  Has had some sore throat. Using some nasal saline but no medication.  Nonsmoker.  Friend was sick and he was around her before he got sick. No other aggravating or relieving factors.  No other complaint.    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     warfarin (COUMADIN) 5 MG tablet TAKE 1 1/2TABLETS BY MOUTH DAILY OR AS DIRECTED BY COUMADIN CLINIC. 135 tablet 0   No current facility-administered medications on file prior to visit.    ROS as in subjective   Objective: BP 122/80   Pulse 63   Temp (!) 96.8 F (36 C)   Wt 178 lb 6.4 oz (80.9 kg)   SpO2 98%   BMI 24.88 kg/m   General appearance: Alert, well developed, well nourished, no distress                             Skin: warm, no rash                            Eyes: conjunctiva pink, corneas clear                            Ears: flat left  tympanic membrane, flat right tympanic membrane, external ear canals normal                          Nose: septum midline, turbinates swollen, with erythema and clear discharge             Mouth/throat: MMM, tongue normal, mild pharyngeal erythema                           Neck: supple, no adenopathy, no thyromegaly, non tender                         Lungs: clear, no wheezes, no rales, no rhonchi       Assessment  Encounter Diagnosis  Name Primary?   Acute  non-recurrent sinusitis, unspecified location Yes      Plan: Discussed diagnosis of sinusitis.  Discussed usual time frame to see improvement. Discussed possible complications or symptoms that would prompt call back or recheck within the next few days.     Medications prescribed:  Complete the course of Amoxicillin antibiotic prescribed today.   Consider mucinex.   Specific home care recommendations discussed: Pain/fever relief: You may use over-the-counter Acetaminophen/Tylenol for pain or fever Drink extra fluids. Fluids help thin the mucus so your sinuses can drain more easily.  Use saline nasal sprays to help moisten your sinuses. The sprays can be found at your local drugstore.   Patient voiced understanding of diagnosis, recommendations, and treatment plan.

## 2023-02-26 ENCOUNTER — Other Ambulatory Visit (HOSPITAL_BASED_OUTPATIENT_CLINIC_OR_DEPARTMENT_OTHER): Payer: Self-pay

## 2023-02-26 ENCOUNTER — Other Ambulatory Visit: Payer: Self-pay | Admitting: *Deleted

## 2023-02-26 DIAGNOSIS — Z952 Presence of prosthetic heart valve: Secondary | ICD-10-CM

## 2023-02-26 MED ORDER — WARFARIN SODIUM 5 MG PO TABS: ORAL_TABLET | ORAL | 0 refills | Status: DC

## 2023-02-26 NOTE — Telephone Encounter (Signed)
Warfarin 5mg  refill S/p AVR Last INR 01/11/23 & PENDING APPT 03/01/23 Last OV 03/07/22

## 2023-02-26 NOTE — Progress Notes (Signed)
Received fax for warfarin refill. Will route to anticoag team.

## 2023-03-01 ENCOUNTER — Ambulatory Visit: Payer: 59 | Attending: Cardiology

## 2023-03-01 DIAGNOSIS — Z952 Presence of prosthetic heart valve: Secondary | ICD-10-CM

## 2023-03-01 DIAGNOSIS — Z7901 Long term (current) use of anticoagulants: Secondary | ICD-10-CM

## 2023-03-01 DIAGNOSIS — Z8673 Personal history of transient ischemic attack (TIA), and cerebral infarction without residual deficits: Secondary | ICD-10-CM

## 2023-03-01 LAB — POCT INR: INR: 4.9 — AB (ref 2.0–3.0)

## 2023-03-01 NOTE — Patient Instructions (Signed)
HOLD TODAY ONLY THEN continue taking Warfarin 1.5 tablets of warfarin daily. INR in 3 weeks. Call coumadin clinic [phone] if you have missed a dose or had any recent changes in medications or upcoming procedures.

## 2023-03-22 ENCOUNTER — Ambulatory Visit: Payer: 59 | Attending: Cardiology

## 2023-03-22 DIAGNOSIS — Z7901 Long term (current) use of anticoagulants: Secondary | ICD-10-CM | POA: Diagnosis not present

## 2023-03-22 DIAGNOSIS — Z952 Presence of prosthetic heart valve: Secondary | ICD-10-CM | POA: Diagnosis not present

## 2023-03-22 LAB — POCT INR: INR: 5.1 — AB (ref 2.0–3.0)

## 2023-03-22 NOTE — Patient Instructions (Addendum)
Description   EAT GREENS AND HOLD TODAY ONLY THEN START taking Warfarin 1.5 tablets of warfarin daily except 1 tablet on Sundays.  Recheck INR in 2 weeks.  Call coumadin clinic [phone] if you have missed a dose or had any recent changes in medications or upcoming procedures.

## 2023-04-05 ENCOUNTER — Ambulatory Visit: Payer: 59 | Attending: Cardiology

## 2023-04-05 DIAGNOSIS — Z8673 Personal history of transient ischemic attack (TIA), and cerebral infarction without residual deficits: Secondary | ICD-10-CM | POA: Diagnosis not present

## 2023-04-05 DIAGNOSIS — Z952 Presence of prosthetic heart valve: Secondary | ICD-10-CM

## 2023-04-05 DIAGNOSIS — Z7901 Long term (current) use of anticoagulants: Secondary | ICD-10-CM | POA: Diagnosis not present

## 2023-04-05 LAB — POCT INR: INR: 2.2 (ref 2.0–3.0)

## 2023-04-05 NOTE — Patient Instructions (Signed)
 Continue taking Warfarin 1.5 tablets of warfarin daily except 1 tablet on Sundays.  Recheck INR in 8 weeks.  Call coumadin clinic [phone] if you have missed a dose or had any recent changes in medications or upcoming procedures.

## 2023-04-12 ENCOUNTER — Other Ambulatory Visit: Payer: Self-pay | Admitting: Medical

## 2023-04-12 ENCOUNTER — Telehealth: Payer: Self-pay | Admitting: Family Medicine

## 2023-04-12 MED ORDER — AMOXICILLIN-POT CLAVULANATE 875-125 MG PO TABS
1.0000 | ORAL_TABLET | Freq: Two times a day (BID) | ORAL | 0 refills | Status: DC
Start: 2023-04-12 — End: 2023-06-26

## 2023-04-12 NOTE — Telephone Encounter (Signed)
Pt asking if we can call in rx for another sinus inf  , seen for one last month He said it did clear up previously  Had flu last week and now had sinus cong , green mucous,

## 2023-04-29 ENCOUNTER — Other Ambulatory Visit: Payer: Self-pay | Admitting: *Deleted

## 2023-04-29 DIAGNOSIS — Z952 Presence of prosthetic heart valve: Secondary | ICD-10-CM

## 2023-04-29 MED ORDER — WARFARIN SODIUM 5 MG PO TABS: ORAL_TABLET | ORAL | 0 refills | Status: DC

## 2023-04-29 NOTE — Telephone Encounter (Signed)
Warfarin 5mg  refill Dx S/P AVR (aortic valve replacement)  Last INR 04/05/23 Last OV 03/07/22 and pending appt on 06/26/23 with Dr. Duke Salvia

## 2023-05-21 ENCOUNTER — Encounter: Payer: Self-pay | Admitting: Internal Medicine

## 2023-05-31 ENCOUNTER — Ambulatory Visit: Payer: 59 | Attending: Cardiology

## 2023-05-31 DIAGNOSIS — Z8673 Personal history of transient ischemic attack (TIA), and cerebral infarction without residual deficits: Secondary | ICD-10-CM

## 2023-05-31 DIAGNOSIS — Z7901 Long term (current) use of anticoagulants: Secondary | ICD-10-CM | POA: Diagnosis not present

## 2023-05-31 DIAGNOSIS — Z952 Presence of prosthetic heart valve: Secondary | ICD-10-CM

## 2023-05-31 LAB — POCT INR: INR: 5.1 — AB (ref 2.0–3.0)

## 2023-05-31 NOTE — Patient Instructions (Signed)
 Hold tonight and eat greens then Continue taking Warfarin 1.5 tablets of warfarin daily except 1 tablet on Sundays.  Recheck INR in 4 weeks.  Call coumadin clinic [phone] if you have missed a dose or had any recent changes in medications or upcoming procedures.

## 2023-06-24 ENCOUNTER — Encounter (HOSPITAL_BASED_OUTPATIENT_CLINIC_OR_DEPARTMENT_OTHER): Payer: Self-pay

## 2023-06-26 ENCOUNTER — Ambulatory Visit (HOSPITAL_BASED_OUTPATIENT_CLINIC_OR_DEPARTMENT_OTHER): Payer: 59 | Admitting: Cardiovascular Disease

## 2023-06-26 ENCOUNTER — Encounter (HOSPITAL_BASED_OUTPATIENT_CLINIC_OR_DEPARTMENT_OTHER): Payer: Self-pay | Admitting: Cardiovascular Disease

## 2023-06-26 VITALS — BP 108/72 | HR 53 | Ht 71.0 in | Wt 183.0 lb

## 2023-06-26 DIAGNOSIS — Z952 Presence of prosthetic heart valve: Secondary | ICD-10-CM | POA: Diagnosis not present

## 2023-06-26 DIAGNOSIS — Q231 Congenital insufficiency of aortic valve: Secondary | ICD-10-CM | POA: Diagnosis not present

## 2023-06-26 DIAGNOSIS — Z8673 Personal history of transient ischemic attack (TIA), and cerebral infarction without residual deficits: Secondary | ICD-10-CM

## 2023-06-26 DIAGNOSIS — I7121 Aneurysm of the ascending aorta, without rupture: Secondary | ICD-10-CM | POA: Diagnosis not present

## 2023-06-26 DIAGNOSIS — E785 Hyperlipidemia, unspecified: Secondary | ICD-10-CM | POA: Diagnosis not present

## 2023-06-26 NOTE — Progress Notes (Signed)
 Cardiology Office Note:  .   Date:  06/26/2023  ID:  Robert [name], DOB [birth year], MRN [id number] PCP: Ronnald Nian, MD  [facility] Providers Cardiologist:  None     History of Present Illness: .   Robert [name] is a 63 y.o. male with a hx of hyperlipidemia, non-obstructive CAD, DVT, CVA, anxiety, and ascending aorta aneurysm presenting today for follow-up. He was initially seen 02/2021. He underwent aortic valve and root replacement in 2007. 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 He last saw Dr. Delton See 08/2019 and was doing well. He had a repeat echo 09/2019 which revealed LVEF 60-65% and the valve was functioning well. The mean gradient was .    At his initial appointment he was doing well and training for a marathon. His lipids were elevated but he preferred to stay off statins because of his activity. His calcium score 03/2021 was 4 which was 36th percentile.  At his visit 06/2021 he continued to do well but he noted occasional palpitations.   Today, he is doing well. However, occasionally, when he is resting, he feels his heart pounding he describes as similar to being anxious. He endorses he had pericarditis after his open heart surgery. He continues to train for the [employer] and does a track work-out once a week. He is working on his diet for cholesterol management. He denies any chest pain, or shortness of breath, lightheadedness, headaches, syncope, orthopnea, PND, lower extremity edema or exertional symptoms.  Since his last appointment Robert [name] has been doing well.  He continues to exercise regularly, running most days of the week.  On average he runs about 6 miles and feels well both during exercise and even better after he finishes.  He has not had any exertional  dyspnea, chest pain, or lower extremity edema.  He did note that he started back doing track exercises and notes that his stamina for sprinting is about what it used to be.  However he does note improvement with continued training.  Overall he reports that he eats most of his meals at home.  He eats a lot of duck meat and needs to start back for cooking.  He has lots of fruits and vegetables and not getting processed carbohydrates in his diet.  ROS:  As per HPI  Studies Reviewed: Marland Kitchen   EKG Interpretation Date/Time:  Wednesday June 26 2023 09:13:37 EDT Ventricular Rate:  53 PR Interval:  196 QRS Duration:  92 QT Interval:  454 QTC Calculation: 426 R Axis:   70  Text Interpretation: Sinus bradycardia with Premature atrial complexes No previous ECGs available Confirmed by Chilton Si ([id number]) on 06/26/2023 9:20:00 AM     Risk Assessment/Calculations:             Physical Exam:   VS:  BP 108/72   Pulse (!) 53   Ht 5\' 11"  (1.803 m)   Wt 183 lb (83 kg)   SpO2 (!) 53%   BMI 25.52 kg/m  , BMI Body mass index is 25.52 kg/m. GENERAL:  Well appearing HEENT: Pupils equal round and reactive, fundi not visualized, oral mucosa unremarkable NECK:  No jugular venous distention, waveform within normal limits, carotid upstroke brisk and symmetric, no bruits, no thyromegaly LUNGS:  Clear to auscultation bilaterally HEART:  RRR.  PMI not  displaced or sustained,S1 within normal limits.  Mechanical S2. No S3, no S4, no clicks, no rubs, I/VI systolic murmur at the LUSB ABD:  Flat, positive bowel sounds normal in frequency in pitch, no bruits, no rebound, no guarding, no midline pulsatile mass, no hepatomegaly, no splenomegaly EXT:  2 plus pulses throughout, no edema, no cyanosis no clubbing SKIN:  No rashes no nodules NEURO:  Cranial nerves II through XII grossly intact, motor grossly intact throughout PSYCH:  Cognitively intact, oriented to person place and time   ASSESSMENT AND PLAN: .    #  s/p AVR:  # Aortic root repair: No symptoms of heart failure or valve failure.  BP well-controlled.  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  Will get surveillance echo.  He was advised to continue to avoid heavy lifting if it requires Valsalva.  Continue warfarin. +Antibiotic prophylaxis prior to dentist.   # Hyperlipidemia:  # Non-obstricutve CAD:  He will come for fasting lipids/CMP.  He isn't interested in statins due to his running daily.  Calcium score was 35th percentile.       Dispo: f/u 1 year  Signed, Chilton Si, MD

## 2023-06-26 NOTE — Patient Instructions (Signed)
 Medication Instructions:  Your physician recommends that you continue on your current medications as directed. Please refer to the Current Medication list given to you today.  *If you need a refill on your cardiac medications before your next appointment, please call your pharmacy*  Lab Work: FASTING LP/CMET SOON   If you have labs (blood work) drawn today and your tests are completely normal, you will receive your results only by: MyChart Message (if you have MyChart) OR A paper copy in the mail If you have any lab test that is abnormal or we need to change your treatment, we will call you to review the results.  Testing/Procedures: Your physician has requested that you have an echocardiogram. Echocardiography is a painless test that uses sound waves to create images of your heart. It provides your doctor with information about the size and shape of your heart and how well your heart's chambers and valves are working. This procedure takes approximately one hour. There are no restrictions for this procedure. Please do NOT wear cologne, perfume, aftershave, or lotions (deodorant is allowed). Please arrive 15 minutes prior to your appointment time.  Please note: We ask at that you not bring children with you during ultrasound (echo/ vascular) testing. Due to room size and safety concerns, children are not allowed in the ultrasound rooms during exams. Our front office staff cannot provide observation of children in our lobby area while testing is being conducted. An adult accompanying a patient to their appointment will only be allowed in the ultrasound room at the discretion of the ultrasound technician under special circumstances. We apologize for any inconvenience.   Follow-Up: At [facility], you and your health needs are our priority.  As part of our continuing mission to provide you with exceptional heart care, our providers are all part of one team.  This team includes your primary  Cardiologist (physician) and Advanced Practice Providers or APPs (Physician Assistants and Nurse Practitioners) who all work together to provide you with the care you need, when you need it.  Your next appointment:   12 month(s)  Provider:   Chilton Si, MD, Eligha Bridegroom, NP, or Gillian Shields, NP   We recommend signing up for the patient portal called "MyChart".  Sign up information is provided on this After Visit Summary.  MyChart is used to connect with patients for Virtual Visits (Telemedicine).  Patients are able to view lab/test results, encounter notes, upcoming appointments, etc.  Non-urgent messages can be sent to your provider as well.   To learn more about what you can do with MyChart, go to [email or web address].

## 2023-06-28 ENCOUNTER — Ambulatory Visit

## 2023-07-05 ENCOUNTER — Ambulatory Visit

## 2023-07-10 ENCOUNTER — Ambulatory Visit: Attending: Cardiology

## 2023-07-10 DIAGNOSIS — Z7901 Long term (current) use of anticoagulants: Secondary | ICD-10-CM

## 2023-07-10 DIAGNOSIS — Z952 Presence of prosthetic heart valve: Secondary | ICD-10-CM | POA: Diagnosis not present

## 2023-07-10 LAB — POCT INR: INR: 3 (ref 2.0–3.0)

## 2023-07-10 NOTE — Patient Instructions (Signed)
 Description   Continue taking Warfarin 1.5 tablets of warfarin daily except 1 tablet on Sundays.  Recheck INR in 6 weeks.  Call coumadin clinic [phone] if you have missed a dose or had any recent changes in medications or upcoming procedures.

## 2023-07-16 ENCOUNTER — Other Ambulatory Visit (HOSPITAL_BASED_OUTPATIENT_CLINIC_OR_DEPARTMENT_OTHER)

## 2023-08-07 ENCOUNTER — Encounter (HOSPITAL_BASED_OUTPATIENT_CLINIC_OR_DEPARTMENT_OTHER): Payer: Self-pay

## 2023-08-11 ENCOUNTER — Other Ambulatory Visit: Payer: Self-pay | Admitting: Cardiovascular Disease

## 2023-08-11 DIAGNOSIS — Z952 Presence of prosthetic heart valve: Secondary | ICD-10-CM

## 2023-08-20 ENCOUNTER — Other Ambulatory Visit (HOSPITAL_BASED_OUTPATIENT_CLINIC_OR_DEPARTMENT_OTHER)

## 2023-08-22 ENCOUNTER — Ambulatory Visit: Attending: Cardiology | Admitting: *Deleted

## 2023-08-22 ENCOUNTER — Ambulatory Visit

## 2023-08-22 DIAGNOSIS — Z5181 Encounter for therapeutic drug level monitoring: Secondary | ICD-10-CM

## 2023-08-22 DIAGNOSIS — Z952 Presence of prosthetic heart valve: Secondary | ICD-10-CM | POA: Diagnosis not present

## 2023-08-22 DIAGNOSIS — Z7901 Long term (current) use of anticoagulants: Secondary | ICD-10-CM

## 2023-08-22 LAB — POCT INR: POC INR: 2

## 2023-08-22 NOTE — Patient Instructions (Signed)
 Description   Take 2 tablets of warfarin today and tomorrow, then continue taking Warfarin 1.5 tablets of warfarin daily except 1 tablet on Sundays.  Recheck INR in 3 weeks.  Call coumadin  clinic [phone] if you have missed a dose or had any recent changes in medications or upcoming procedures.

## 2023-08-30 ENCOUNTER — Ambulatory Visit

## 2023-09-12 ENCOUNTER — Ambulatory Visit: Attending: Cardiology | Admitting: *Deleted

## 2023-09-12 DIAGNOSIS — Z952 Presence of prosthetic heart valve: Secondary | ICD-10-CM | POA: Diagnosis not present

## 2023-09-12 DIAGNOSIS — Z7901 Long term (current) use of anticoagulants: Secondary | ICD-10-CM | POA: Diagnosis not present

## 2023-09-12 LAB — POCT INR: POC INR: 3.2

## 2023-09-12 NOTE — Patient Instructions (Signed)
 Description   Continue taking Warfarin 1.5 tablets of warfarin daily except 1 tablet on Sundays.  Recheck INR in 4 weeks.  Call coumadin  clinic [phone] if you have missed a dose or had any recent changes in medications or upcoming procedures.

## 2023-09-12 NOTE — Progress Notes (Signed)
 See anticoag encounter

## 2023-09-18 ENCOUNTER — Ambulatory Visit (HOSPITAL_BASED_OUTPATIENT_CLINIC_OR_DEPARTMENT_OTHER)

## 2023-09-18 DIAGNOSIS — I5030 Unspecified diastolic (congestive) heart failure: Secondary | ICD-10-CM | POA: Diagnosis not present

## 2023-09-18 DIAGNOSIS — Z952 Presence of prosthetic heart valve: Secondary | ICD-10-CM | POA: Diagnosis not present

## 2023-09-25 ENCOUNTER — Ambulatory Visit: Payer: Self-pay | Admitting: Cardiovascular Disease

## 2023-09-25 LAB — ECHOCARDIOGRAM COMPLETE
AR max vel: 1.37 cm2
AV Area VTI: 1.56 cm2
AV Area mean vel: 1.37 cm2
AV Mean grad: 9 mmHg
AV Peak grad: 16.8 mmHg
Ao pk vel: 2.05 m/s
Area-P 1/2: 2.91 cm2
S' Lateral: 2.25 cm

## 2023-10-10 ENCOUNTER — Ambulatory Visit: Attending: Cardiology | Admitting: *Deleted

## 2023-10-10 DIAGNOSIS — Z7901 Long term (current) use of anticoagulants: Secondary | ICD-10-CM

## 2023-10-10 DIAGNOSIS — Z952 Presence of prosthetic heart valve: Secondary | ICD-10-CM

## 2023-10-10 LAB — POCT INR: POC INR: 2.4

## 2023-10-10 NOTE — Progress Notes (Signed)
Please see anticoagulation encounter.

## 2023-10-10 NOTE — Patient Instructions (Signed)
 Description   Take 2 tabs of warfarin today then continue taking Warfarin 1.5 tablets daily except 1 tablet on Sundays.  Recheck INR in 4 weeks.  Call coumadin  clinic [phone] if you have missed a dose or had any recent changes in medications or upcoming procedures.

## 2023-11-07 ENCOUNTER — Ambulatory Visit: Payer: Self-pay | Attending: Cardiology | Admitting: *Deleted

## 2023-11-07 DIAGNOSIS — Z5181 Encounter for therapeutic drug level monitoring: Secondary | ICD-10-CM

## 2023-11-07 DIAGNOSIS — Z952 Presence of prosthetic heart valve: Secondary | ICD-10-CM

## 2023-11-07 DIAGNOSIS — Z7901 Long term (current) use of anticoagulants: Secondary | ICD-10-CM

## 2023-11-07 LAB — POCT INR: POC INR: 2.5

## 2023-11-07 NOTE — Progress Notes (Signed)
 INR 2.5. Please see anticoagulation encounter

## 2023-11-07 NOTE — Patient Instructions (Signed)
 Description   Continue taking Warfarin 1.5 tablets daily except 1 tablet on Sundays.  Recheck INR in 5 weeks.  Call coumadin clinic [phone] if you have missed a dose or had any recent changes in medications or upcoming procedures.

## 2023-11-26 ENCOUNTER — Other Ambulatory Visit: Payer: Self-pay

## 2023-11-26 DIAGNOSIS — Z952 Presence of prosthetic heart valve: Secondary | ICD-10-CM

## 2023-11-26 MED ORDER — WARFARIN SODIUM 5 MG PO TABS: ORAL_TABLET | ORAL | 2 refills | Status: AC

## 2023-12-12 ENCOUNTER — Ambulatory Visit: Payer: Self-pay | Attending: Cardiology

## 2023-12-12 DIAGNOSIS — Z952 Presence of prosthetic heart valve: Secondary | ICD-10-CM

## 2023-12-12 DIAGNOSIS — Z8673 Personal history of transient ischemic attack (TIA), and cerebral infarction without residual deficits: Secondary | ICD-10-CM

## 2023-12-12 DIAGNOSIS — Z7901 Long term (current) use of anticoagulants: Secondary | ICD-10-CM

## 2023-12-12 LAB — POCT INR: INR: 1.7 — AB (ref 2.0–3.0)

## 2023-12-12 NOTE — Patient Instructions (Signed)
 Take 2 tablets today only then Continue taking Warfarin 1.5 tablets daily except 1 tablet on Sundays.  Recheck INR in 6 weeks.  Call coumadin  clinic [phone] if you have missed a dose or had any recent changes in medications or upcoming procedures.

## 2023-12-12 NOTE — Progress Notes (Signed)
 INR 1.7 Please see anticoagulation encounter Take 2 tablets today only then Continue taking Warfarin 1.5 tablets daily except 1 tablet on Sundays.  Recheck INR in 6 weeks.  Call coumadin  clinic [phone] if you have missed a dose or had any recent changes in medications or upcoming procedures.

## 2024-01-23 ENCOUNTER — Ambulatory Visit: Payer: Self-pay | Attending: Cardiology | Admitting: *Deleted

## 2024-01-23 DIAGNOSIS — Z952 Presence of prosthetic heart valve: Secondary | ICD-10-CM

## 2024-01-23 DIAGNOSIS — Z7901 Long term (current) use of anticoagulants: Secondary | ICD-10-CM

## 2024-01-23 LAB — POCT INR: POC INR: 2.3

## 2024-01-23 NOTE — Patient Instructions (Signed)
 Description   Take 2 tablets today only then Start taking warfarin 1.5 tablets daily.  Recheck INR in 2 weeks.  Call coumadin  clinic [phone] if you have missed a dose or had any recent changes in medications or upcoming procedures.

## 2024-01-23 NOTE — Progress Notes (Signed)
 Lab Results  Component Value Date   INR 2.3 01/23/2024   INR 1.7 (A) 12/12/2023   INR 2.5 11/07/2023   PROTIME 21.6 09/09/2008   PROTIME 16.4 08/31/2008   PROTIME 25.3 08/24/2008    Description   Take 2 tablets today only then Start taking warfarin 1.5 tablets daily.  Recheck INR in 2 weeks. ( Coming back back First week of December because he will be in [place].)  Call coumadin  clinic [phone] if you have missed a dose or had any recent changes in medications or upcoming procedures.

## 2024-02-05 ENCOUNTER — Telehealth: Payer: Self-pay | Admitting: *Deleted

## 2024-02-05 NOTE — Telephone Encounter (Addendum)
 Patient left a message to call back. He was in [employer] and will be there for an hour. Spoke with patient and he states he will have to see if the [place] pharmacy will let him have meds from his med list order in MyChart.  Advised that he needs to let us  know as soon as he can as we do not have any rapport or prescribing privileges with any [place] [facility] so we do not know how that would work as we do not order for meds put the country. He states he will work on that soon as he gets to [place] and update by calling us  back on late Thursday or early Friday.

## 2024-02-05 NOTE — Telephone Encounter (Signed)
 Patient left a voicemail stating that he was at the airport and has a problem, and the problem is he forgot his warfarin at home on the counter and to call him in the next 15 minutes otherwise he will be on the plane to [place] and be unreachable for hours.  Will try to call him and just leave a message if unable to reach him since it has been more than 15 minutes.

## 2024-02-27 ENCOUNTER — Ambulatory Visit: Payer: Self-pay

## 2024-03-11 ENCOUNTER — Ambulatory Visit: Payer: Self-pay | Attending: Cardiology

## 2024-03-11 DIAGNOSIS — Z7901 Long term (current) use of anticoagulants: Secondary | ICD-10-CM

## 2024-03-11 DIAGNOSIS — Z8673 Personal history of transient ischemic attack (TIA), and cerebral infarction without residual deficits: Secondary | ICD-10-CM

## 2024-03-11 DIAGNOSIS — Z952 Presence of prosthetic heart valve: Secondary | ICD-10-CM

## 2024-03-11 LAB — POCT INR: INR: 3.5 — AB (ref 2.0–3.0)

## 2024-03-11 NOTE — Progress Notes (Signed)
 Description   INR 3.5: Continue taking warfarin 1.5 tablets daily.  Recheck INR in 4 weeks. Call coumadin  clinic [phone] if you have missed a dose or had any recent changes in medications or upcoming procedures.

## 2024-03-11 NOTE — Patient Instructions (Addendum)
 INR 3.5: Continue taking warfarin 1.5 tablets daily.  Recheck INR in 4 weeks.  Call coumadin  clinic [phone] if you have missed a dose or had any recent changes in medications or upcoming procedures.

## 2024-03-13 ENCOUNTER — Other Ambulatory Visit: Payer: Self-pay | Admitting: Cardiovascular Disease

## 2024-03-13 DIAGNOSIS — Z952 Presence of prosthetic heart valve: Secondary | ICD-10-CM

## 2024-03-13 NOTE — Telephone Encounter (Signed)
 Warfarin 5mg  Dx-S/P AVR  Last INR Check-03/11/24 Last OV- 06/26/23

## 2024-04-08 ENCOUNTER — Ambulatory Visit: Payer: Self-pay

## 2024-04-16 ENCOUNTER — Ambulatory Visit: Payer: Self-pay | Admitting: *Deleted

## 2024-04-16 DIAGNOSIS — Z7901 Long term (current) use of anticoagulants: Secondary | ICD-10-CM

## 2024-04-16 DIAGNOSIS — Z952 Presence of prosthetic heart valve: Secondary | ICD-10-CM

## 2024-04-16 LAB — POCT INR: POC INR: 2.2

## 2024-04-16 NOTE — Patient Instructions (Signed)
 Description   INR 2.2; Take 2 tablets of warfarin today and then continue taking warfarin 1.5 tablets daily.  Recheck INR in 3 weeks. Call coumadin  clinic [phone] if you have missed a dose or had any recent changes in medications or upcoming procedures.

## 2024-04-16 NOTE — Progress Notes (Signed)
 Lab Results  Component Value Date   INR 2.2 04/16/2024   INR 3.5 (A) 03/11/2024   INR 2.3 01/23/2024   PROTIME 21.6 09/09/2008   PROTIME 16.4 08/31/2008   PROTIME 25.3 08/24/2008    Description   INR 2.2; Take 2 tablets of warfarin today and then continue taking warfarin 1.5 tablets daily.  Recheck INR in 3 weeks. Call coumadin  clinic [phone] if you have missed a dose or had any recent changes in medications or upcoming procedures.

## 2024-05-15 ENCOUNTER — Ambulatory Visit: Payer: Self-pay
